# Patient Record
Sex: Female | Born: 1960 | Race: White | Hispanic: No | Marital: Married | Smoking: Current every day smoker
Health system: Southern US, Community
[De-identification: ages and names within clinical notes are randomized; demographics above are authoritative.]

## PROBLEM LIST (undated history)

## (undated) DIAGNOSIS — Z8709 Personal history of other diseases of the respiratory system: Secondary | ICD-10-CM

## (undated) DIAGNOSIS — G4700 Insomnia, unspecified: Secondary | ICD-10-CM

## (undated) DIAGNOSIS — E538 Deficiency of other specified B group vitamins: Secondary | ICD-10-CM

## (undated) DIAGNOSIS — K5900 Constipation, unspecified: Secondary | ICD-10-CM

## (undated) DIAGNOSIS — Z860100 Personal history of colon polyps, unspecified: Secondary | ICD-10-CM

## (undated) DIAGNOSIS — K279 Peptic ulcer, site unspecified, unspecified as acute or chronic, without hemorrhage or perforation: Secondary | ICD-10-CM

## (undated) DIAGNOSIS — Z8742 Personal history of other diseases of the female genital tract: Secondary | ICD-10-CM

## (undated) DIAGNOSIS — E785 Hyperlipidemia, unspecified: Secondary | ICD-10-CM

## (undated) DIAGNOSIS — Z973 Presence of spectacles and contact lenses: Secondary | ICD-10-CM

## (undated) DIAGNOSIS — I739 Peripheral vascular disease, unspecified: Secondary | ICD-10-CM

## (undated) DIAGNOSIS — F32A Depression, unspecified: Secondary | ICD-10-CM

## (undated) DIAGNOSIS — Z8669 Personal history of other diseases of the nervous system and sense organs: Secondary | ICD-10-CM

## (undated) DIAGNOSIS — Z9889 Other specified postprocedural states: Secondary | ICD-10-CM

## (undated) DIAGNOSIS — F419 Anxiety disorder, unspecified: Secondary | ICD-10-CM

## (undated) DIAGNOSIS — R531 Weakness: Secondary | ICD-10-CM

## (undated) DIAGNOSIS — A6000 Herpesviral infection of urogenital system, unspecified: Secondary | ICD-10-CM

## (undated) DIAGNOSIS — K649 Unspecified hemorrhoids: Secondary | ICD-10-CM

## (undated) DIAGNOSIS — K219 Gastro-esophageal reflux disease without esophagitis: Secondary | ICD-10-CM

## (undated) DIAGNOSIS — J45909 Unspecified asthma, uncomplicated: Secondary | ICD-10-CM

## (undated) DIAGNOSIS — R112 Nausea with vomiting, unspecified: Secondary | ICD-10-CM

## (undated) DIAGNOSIS — B009 Herpesviral infection, unspecified: Secondary | ICD-10-CM

## (undated) DIAGNOSIS — N200 Calculus of kidney: Secondary | ICD-10-CM

## (undated) DIAGNOSIS — M549 Dorsalgia, unspecified: Secondary | ICD-10-CM

## (undated) DIAGNOSIS — F329 Major depressive disorder, single episode, unspecified: Secondary | ICD-10-CM

## (undated) DIAGNOSIS — R519 Headache, unspecified: Secondary | ICD-10-CM

## (undated) HISTORY — DX: Herpesviral infection of urogenital system, unspecified: A60.00

## (undated) HISTORY — DX: Peripheral vascular disease, unspecified: I73.9

## (undated) HISTORY — DX: Anxiety disorder, unspecified: F41.9

## (undated) HISTORY — PX: ANKLE SURGERY: SHX546

## (undated) HISTORY — DX: Hyperlipidemia, unspecified: E78.5

## (undated) HISTORY — DX: Personal history of other diseases of the female genital tract: Z87.42

## (undated) HISTORY — PX: OTHER SURGICAL HISTORY: SHX169

## (undated) HISTORY — PX: CORONARY ANGIOPLASTY: SHX604

## (undated) HISTORY — DX: Calculus of kidney: N20.0

## (undated) HISTORY — PX: CERVICAL CONE BIOPSY: SUR198

## (undated) HISTORY — DX: Gastro-esophageal reflux disease without esophagitis: K21.9

## (undated) HISTORY — DX: Herpesviral infection, unspecified: B00.9

## (undated) HISTORY — DX: Deficiency of other specified B group vitamins: E53.8

## (undated) HISTORY — DX: Peptic ulcer, site unspecified, unspecified as acute or chronic, without hemorrhage or perforation: K27.9

## (undated) HISTORY — PX: ECTOPIC PREGNANCY SURGERY: SHX613

## (undated) HISTORY — DX: Unspecified asthma, uncomplicated: J45.909

---

## 1984-05-16 DIAGNOSIS — S82409A Unspecified fracture of shaft of unspecified fibula, initial encounter for closed fracture: Secondary | ICD-10-CM

## 1984-05-16 HISTORY — DX: Unspecified fracture of shaft of unspecified tibia, initial encounter for closed fracture: S82.409A

## 1984-05-16 HISTORY — PX: ANKLE SURGERY: SHX546

## 1998-04-06 ENCOUNTER — Ambulatory Visit (HOSPITAL_COMMUNITY): Admission: RE | Admit: 1998-04-06 | Discharge: 1998-04-06 | Payer: Self-pay | Admitting: Family Medicine

## 1999-07-20 ENCOUNTER — Encounter: Payer: Self-pay | Admitting: Family Medicine

## 1999-07-20 ENCOUNTER — Ambulatory Visit (HOSPITAL_COMMUNITY): Admission: RE | Admit: 1999-07-20 | Discharge: 1999-07-20 | Payer: Self-pay | Admitting: Family Medicine

## 1999-08-20 ENCOUNTER — Other Ambulatory Visit: Admission: RE | Admit: 1999-08-20 | Discharge: 1999-08-20 | Payer: Self-pay | Admitting: Obstetrics and Gynecology

## 2000-03-06 ENCOUNTER — Encounter: Payer: Self-pay | Admitting: Obstetrics and Gynecology

## 2000-03-06 ENCOUNTER — Observation Stay (HOSPITAL_COMMUNITY): Admission: EM | Admit: 2000-03-06 | Discharge: 2000-03-07 | Payer: Self-pay | Admitting: Internal Medicine

## 2000-03-06 ENCOUNTER — Encounter (INDEPENDENT_AMBULATORY_CARE_PROVIDER_SITE_OTHER): Payer: Self-pay

## 2000-12-27 ENCOUNTER — Encounter: Payer: Self-pay | Admitting: Family Medicine

## 2000-12-27 ENCOUNTER — Encounter: Admission: RE | Admit: 2000-12-27 | Discharge: 2000-12-27 | Payer: Self-pay | Admitting: Family Medicine

## 2001-01-08 ENCOUNTER — Encounter: Admission: RE | Admit: 2001-01-08 | Discharge: 2001-01-08 | Payer: Self-pay | Admitting: Family Medicine

## 2001-01-08 ENCOUNTER — Encounter: Payer: Self-pay | Admitting: Family Medicine

## 2001-11-30 ENCOUNTER — Encounter: Payer: Self-pay | Admitting: Family Medicine

## 2001-11-30 ENCOUNTER — Encounter: Admission: RE | Admit: 2001-11-30 | Discharge: 2001-11-30 | Payer: Self-pay | Admitting: Family Medicine

## 2002-01-16 ENCOUNTER — Encounter: Admission: RE | Admit: 2002-01-16 | Discharge: 2002-01-16 | Payer: Self-pay | Admitting: Family Medicine

## 2002-01-16 ENCOUNTER — Encounter: Payer: Self-pay | Admitting: Family Medicine

## 2002-05-14 ENCOUNTER — Encounter: Admission: RE | Admit: 2002-05-14 | Discharge: 2002-05-14 | Payer: Self-pay | Admitting: Family Medicine

## 2002-05-14 ENCOUNTER — Encounter: Payer: Self-pay | Admitting: Family Medicine

## 2002-08-19 ENCOUNTER — Encounter: Payer: Self-pay | Admitting: Family Medicine

## 2002-08-19 ENCOUNTER — Encounter: Admission: RE | Admit: 2002-08-19 | Discharge: 2002-08-19 | Payer: Self-pay | Admitting: Family Medicine

## 2002-12-20 ENCOUNTER — Encounter: Payer: Self-pay | Admitting: Family Medicine

## 2002-12-20 ENCOUNTER — Encounter: Admission: RE | Admit: 2002-12-20 | Discharge: 2002-12-20 | Payer: Self-pay | Admitting: Family Medicine

## 2003-02-07 ENCOUNTER — Encounter: Payer: Self-pay | Admitting: Family Medicine

## 2003-02-07 ENCOUNTER — Encounter: Admission: RE | Admit: 2003-02-07 | Discharge: 2003-02-07 | Payer: Self-pay | Admitting: Family Medicine

## 2003-07-28 ENCOUNTER — Encounter: Admission: RE | Admit: 2003-07-28 | Discharge: 2003-07-28 | Payer: Self-pay | Admitting: Family Medicine

## 2003-07-30 ENCOUNTER — Emergency Department (HOSPITAL_COMMUNITY): Admission: EM | Admit: 2003-07-30 | Discharge: 2003-07-30 | Payer: Self-pay | Admitting: Emergency Medicine

## 2004-11-30 ENCOUNTER — Encounter: Admission: RE | Admit: 2004-11-30 | Discharge: 2004-11-30 | Payer: Self-pay | Admitting: Family Medicine

## 2005-06-16 ENCOUNTER — Encounter: Admission: RE | Admit: 2005-06-16 | Discharge: 2005-06-16 | Payer: Self-pay | Admitting: Family Medicine

## 2005-07-07 ENCOUNTER — Encounter: Admission: RE | Admit: 2005-07-07 | Discharge: 2005-07-07 | Payer: Self-pay | Admitting: Family Medicine

## 2006-05-16 HISTORY — PX: CERVICAL CONE BIOPSY: SUR198

## 2006-11-21 ENCOUNTER — Encounter: Admission: RE | Admit: 2006-11-21 | Discharge: 2006-11-21 | Payer: Self-pay | Admitting: Family Medicine

## 2006-11-27 ENCOUNTER — Encounter: Admission: RE | Admit: 2006-11-27 | Discharge: 2006-11-27 | Payer: Self-pay | Admitting: Family Medicine

## 2006-12-19 ENCOUNTER — Encounter (INDEPENDENT_AMBULATORY_CARE_PROVIDER_SITE_OTHER): Payer: Self-pay | Admitting: Obstetrics and Gynecology

## 2006-12-19 ENCOUNTER — Ambulatory Visit (HOSPITAL_BASED_OUTPATIENT_CLINIC_OR_DEPARTMENT_OTHER): Admission: RE | Admit: 2006-12-19 | Discharge: 2006-12-19 | Payer: Self-pay | Admitting: Obstetrics and Gynecology

## 2007-06-07 ENCOUNTER — Encounter: Admission: RE | Admit: 2007-06-07 | Discharge: 2007-06-07 | Payer: Self-pay | Admitting: Family Medicine

## 2007-09-19 ENCOUNTER — Encounter: Admission: RE | Admit: 2007-09-19 | Discharge: 2007-09-19 | Payer: Self-pay | Admitting: Family Medicine

## 2008-12-05 ENCOUNTER — Encounter: Admission: RE | Admit: 2008-12-05 | Discharge: 2008-12-05 | Payer: Self-pay | Admitting: Family Medicine

## 2009-07-29 ENCOUNTER — Encounter: Admission: RE | Admit: 2009-07-29 | Discharge: 2009-07-29 | Payer: Self-pay | Admitting: Internal Medicine

## 2009-12-07 ENCOUNTER — Encounter: Admission: RE | Admit: 2009-12-07 | Discharge: 2009-12-07 | Payer: Self-pay | Admitting: Internal Medicine

## 2010-06-06 ENCOUNTER — Encounter: Payer: Self-pay | Admitting: Family Medicine

## 2010-09-28 NOTE — Op Note (Signed)
[name], [name]                ACCOUNT NO.:  [id number]   MEDICAL RECORD NO.:  [id number]          PATIENT TYPE:  AMB   LOCATION:  [facility]                         FACILITY:  [facility]   PHYSICIAN:  Malachi Pro. Ambrose Mantle, M.D. DATE OF BIRTH:  07-14-1960   DATE OF PROCEDURE:  12/19/2006  DATE OF DISCHARGE:                               OPERATIVE REPORT   PREOPERATIVE DIAGNOSIS:  Cervical intraepithelial neoplasia III.   POSTOPERATIVE DIAGNOSIS:  Cervical intraepithelial neoplasia III.   OPERATION:  1. Dilatation and curettage.  2. Laser conization with CO2 laser.   OPERATOR:  Malachi Pro. Ambrose Mantle, M.D.   General anesthesia.   The patient was brought to the operating room and placed under  satisfactory general anesthesia.  She was placed in lithotomy position.  The cervix was exposed with an ebonized speculum after the area was  draped with wet towels.  The acetic acid was placed on the cervix and I  looked with the colposcope, identified the extent of the lesion.  The  lesion was not as prominent as it had been in the office when I did the  cervical biopsies.  I outlined a cone, trying to include good space  between the abnormal areas and the conization limit.  The lesion was  mainly on the anterior cervix and almost like a pseudopolyp anteriorly.  I outlined the cone with the CO2 laser at 10 continuous.  I then  injected the laser holes with a dilute solution of Pitressin.  I used  about 10 mL of a solution mixed with 10 units of Pitressin and 120 mL of  saline.  Then with the super pulse on 13 watts, I did the conization.  I  cut off the cone at its endocervical margin with scissors.  I cut the  cone at 12 o'clock.  I then did an endocervical curettage, producing  minimal tissue, then dilated the cervix sufficiently to do an  endometrial curettage.  Minimal tissue found.  The cervix was then  reapproximated with sutures in all four quadrants and an additional  suture was placed at  approximately 6 o'clock where the tissue had sort  of folded together.  The patient seemed to tolerate the procedure well.  I sounded the uterus at the end of the procedure to ensure that the  endocervical canal was patent, and the patient was returned to recovery  in satisfactory condition.  Blood loss was less than 10 mL.      Malachi Pro. Ambrose Mantle, M.D.  Electronically Signed     TFH/MEDQ  D:  12/19/2006  T:  12/19/2006  Job:  [id number]

## 2010-10-01 NOTE — Discharge Summary (Signed)
[facility]  Patient:    [name], [name]                           MRN: [id number] Adm. Date:  03/06/00 Disc. Date: 03/07/00 Attending:  Malachi Pro. Ambrose Mantle, M.D.                           Discharge Summary  HISTORY OF PRESENT ILLNESS:  This is a 50 year old white female with blood type A positive, admitted with presumed left tubal pregnancy.  HOSPITAL COURSE:  She underwent a diagnostic laparoscopy and laparoscopic left salpingectomy for a tubal pregnancy.  The patients quantitative beta HCG was 2699.  Ultrasound showed no intrauterine gestation and something suggestive of a tubal pregnancy lateral to the left ovary.  Hemoglobin on admission 14.8, hematocrit 41.7, white count 12,700, platelet count 319,000.  Comprehensive metabolic profile was normal.  Follow-up hematocrit was 33.  On the day after surgery, the patient was afebrile.  She had voided after the catheter was removed.  She was tolerating a regular diet.  Her abdomen was soft, slightly tender from the laparoscopy, no evidence of intra-abdominal hemorrhage.  She was considered a candidate for discharge.  DISCHARGE DIAGNOSIS:  Left tubal pregnancy.  OPERATION:  Laparoscopic left salpingectomy.  FINAL CONDITION:  Improved.  INSTRUCTIONS:  Regular diet, no vaginal entrance, no heavy lifting or strenuous activity.  Call with fever above 100 degrees.  Call with any unusual problems.  Return to the office in one week for follow-up examination. Percocet 5/325 16 tablets one every 4 to 6 hours is given at discharge.  Blood type is A positive. DD:  03/07/00 TD:  03/07/00 Job: [id number]

## 2010-10-01 NOTE — Op Note (Signed)
[facility]  Patient:    [name], [name]                         MRN: [id number] Proc. Date: 03/06/00 Adm. Date:  [id number] Disc. Date: [id number] Attending:  Malon Kindle                           Operative Report  PREOPERATIVE DIAGNOSIS:  Left tubal pregnancy.  POSTOPERATIVE DIAGNOSIS:  Left tubal pregnancy.  OPERATIONS: 1. Diagnostic laparoscopy. 2. Laparoscopic removal of left tubal pregnancy.  OPERATOR:  Malachi Pro. Ambrose Mantle, M.D.  ANESTHESIA:  General.  DESCRIPTION OF PROCEDURE:  The patient was brought to the operating room, placed under satisfactory general anesthesia, and placed in Allen stirrups. The abdomen, vulva, vagina, and urethra were prepped with Betadine solution. The bladder was emptied with a [place] catheter.  The uterus was anterior and quite small.  I did not palpate hard for adnexal masses.  The cervix was drawn into the operative field and a Hulka cannula was placed into the uterus and attached to the anterior cervical lip.  The abdomen was then draped as a sterile field.  A small incision was made in the inferior portion of the umbilicus.  A Veress cannula was placed into the abdominal cavity, elevating the abdominal wall.  It revealed a negative pressure.  Saline was pushed into the abdominal cavity and none could be recovered.  About 3 L of CO2 were then injected into the abdominal cavity.  The incision was enlarged.  During the insufflation, the pressure was always 4-8 mmHg.  After the incision was enlarged, a laparoscopic trocar was inserted, followed by the laparoscope with good visualization of the pelvic contents.  A smaller trocar was inserted under direct vision suprapubically.  Exploration of the abdomen revealed the upper abdomen to be clean.  The liver was smooth.  I did not see any upper abdominal abnormalities.  Exploration of the pelvis revealed the uterus to be anterior and normal size.  The anterior  cul-de-sac was clean.  There was a small amount of blood in the posterior cul-de-sac.  The right tube and ovary appeared normal, except for some very slight tubo-ovarian adhesions.  The fimbriated end of the right tube looked normal.  Exploration of the left adnexa revealed that the left tube was normal in its proximal half and then it dilated into a blue-red mass that was the tubal pregnancy.  The ovary appeared normal, although it was somewhat adherent to the tube.  The easiest approach to the mass was proximally, so I cauterized the proximal part of the tube starting about a couple of centimeters from the uterine cornu and then gradually cauterized down the mesosalpinx.  As I would cauterize, periodically I would cut the cauterized mesosalpinx.  As I got closer to the end of the tube, the tube and ovary were as stated earlier, somewhat adherent.  I was able to dissect that apart and then continue the cauterization until I had removed the left tubal mass.  At this point, I wanted to remove the mass through the umbilical port, so I put the camera through the lower port with a 5 mm laparoscope, but I could not get the tubal mass to go up into the umbilical trocar site.  Therefore, I introduced the 10 mm trocar through the lower port under direct vision and then was able to pull the tubal  mass into the lower trocar itself and then I removed the entire trocar and the tubal pregnancy.  I liberally irrigated with the Nezhat irrigator.  There was no bleeding.  There was no sign of injury to any structures other than the mesosalpinx.  The patient was left with a normal-appearing left ovary, a normal-appearing right tube and ovary, except for a few tubo-ovarian adhesions, a normal uterus, and normal cul-de-sacs.  The CO2 was allowed to escape.  I inspected the trocar site intra-abdominally and suprapubically and there was no bleeding there.  I then very slowly removed the laparoscope through the  umbilical incision and there was no bleeding there.  I closed both sites with interrupted sutures of 3-0 plain catgut, trying to place them as deep into the subcutaneous tissues as possible.  The patient seemed to tolerate the procedure well.  Blood loss was less than 10 cc.  The sponge and needle counts were correct.  The Hulka cannula was removed from the uterus. The patient was returned to recovery in satisfactory condition.  During the procedure, the bladder filled and a Foley catheter was inserted. DD:  03/07/00 TD:  03/07/00 Job: [id number]

## 2010-10-01 NOTE — H&P (Signed)
[facility]  Patient:    [name], [name]                         MRN: [id number] Adm. Date:  [id number] Disc. Date: [id number] Attending:  Malon Kindle                         History and Physical  HISTORY OF PRESENT ILLNESS:  This is a 50 year old white female, para 0-1-2-1, gravida 4, with last menstrual period February 21, 2000, lasting for two days and very light.  Previous period January 24, 2000, for six days.  The patient is admitted for presumed left tubal pregnancy.  This patient saw me on February 29, 2000, complaining of a vaginal discharge with odor and she had had some night sweats present for two months.  An FSH was normal, actually low at 0.8.  She gave a history of having a period that was very light on February 21, 2000, for two days and previous period on January 24, 2000, for six days. The period before that was December 24, 1999, for six days.  She stated that her body became like an oven before her periods.  Her examination was completely negative, except for bacterial vaginosis and she was treated with metronidazole.  She was advised to stop smoking.  She again came in on March 06, 2000, complaining of left side pain that started on March 05, 2000, but at 10 a.m. on March 06, 2000, became severe and doubled her over. Her pregnancy test was positive.  She gave a history of noticing some spotting from the vagina, a pink discharge, over the last 24 hours.  An ultrasound showed no evidence of an intrauterine pregnancy, a possible complex mass adjacent to the left ovary that was about 2.3 cm in maximal diameter.  The patients beta hCG was between 2000-3000.  She was admitted for laparoscopy and possible removal of a left tubal pregnancy.  ALLERGIES:  "LIQUID DEMEROL."  PAST SURGICAL HISTORY:  D&C, conization, ankle surgery, tubal pregnancy in 1997 by laparoscopy.  PAST MEDICAL HISTORY:  Usual childhood diseases.  No major adult  illnesses. No heart problems.  SOCIAL HISTORY:  Alcohol:  Occasional.  Tobacco:  One half pack of cigarettes a day.  REVIEW OF SYSTEMS:  Recent pain with urination.  Otherwise as in the present illness.  FAMILY HISTORY:  Mother alive at 40.  Fathers health unknown.  No brothers or sisters.  MEDICATIONS:  The patient is currently taking metronidazole.  PHYSICAL EXAMINATION:  A well-developed, well-nourished, white female in distress from lower abdominal and left back pain.  VITAL SIGNS:  Pulse 90 sitting and blood pressure 110/70 sitting.  Lying down pulse 90 and blood pressure 130/88.  HEENT:  No cranial abnormalities.  Extraocular movements intact.  Nose and pharynx clear.  NECK:  Supple without thyromegaly.  HEART:  Normal size and sounds.  No murmurs.  LUNGS:  Clear to P&A.  ABDOMEN:  Soft.  Slight referred tenderness from palpation in the right lower quadrant to the left lower quadrant.  No mass.  No rebound tenderness.  PELVIC:  The vulva and vagina are clean, except slight blood discharge.  The cervix is quite tender to motion.  The uterus is anterior and small.  Adnexa: Cannot feel any mass.  ADMITTING IMPRESSION:  Probable left tubal pregnancy.  The patient is admitted for diagnostic laparoscopy. DD:  03/07/00 TD:  03/07/00 Job: [id number]

## 2010-11-11 ENCOUNTER — Other Ambulatory Visit: Payer: Self-pay | Admitting: Internal Medicine

## 2010-11-11 DIAGNOSIS — Z1231 Encounter for screening mammogram for malignant neoplasm of breast: Secondary | ICD-10-CM

## 2010-12-15 ENCOUNTER — Ambulatory Visit
Admission: RE | Admit: 2010-12-15 | Discharge: 2010-12-15 | Disposition: A | Discharge status: Home / Self Care | Payer: Private Health Insurance - Indemnity | Source: Ambulatory Visit | Attending: Internal Medicine | Admitting: Internal Medicine

## 2010-12-15 DIAGNOSIS — Z1231 Encounter for screening mammogram for malignant neoplasm of breast: Secondary | ICD-10-CM

## 2011-02-28 LAB — DIFFERENTIAL
Basophils Absolute: 0.1
Basophils Relative: 1
Eosinophils Absolute: 0.1
Eosinophils Relative: 1
Lymphocytes Relative: 21
Lymphs Abs: 1.7
Monocytes Absolute: 0.5
Monocytes Relative: 6
Neutro Abs: 5.4
Neutrophils Relative %: 71

## 2011-02-28 LAB — COMPREHENSIVE METABOLIC PANEL
ALT: 18
AST: 18
Albumin: 3.8
Alkaline Phosphatase: 65
BUN: 10
CO2: 27
Calcium: 9.1
Chloride: 98
Creatinine, Ser: 0.82
GFR calc Af Amer: >60
GFR calc non Af Amer: >60
Glucose, Bld: 81
Potassium: 4.2
Sodium: 134 — ABNORMAL LOW
Total Bilirubin: 0.5
Total Protein: 6.5

## 2011-02-28 LAB — CBC
HCT: 43.6
Hemoglobin: 14.2
MCHC: 32.7
MCV: 92.5
Platelets: 326
RBC: 4.71
RDW: 12.4
WBC: 7.8

## 2011-02-28 LAB — PREGNANCY, URINE: Preg Test, Ur: NEGATIVE

## 2012-04-18 ENCOUNTER — Other Ambulatory Visit: Payer: Self-pay | Admitting: Internal Medicine

## 2012-04-18 DIAGNOSIS — Z1231 Encounter for screening mammogram for malignant neoplasm of breast: Secondary | ICD-10-CM

## 2012-05-29 ENCOUNTER — Ambulatory Visit
Admission: RE | Admit: 2012-05-29 | Discharge: 2012-05-29 | Disposition: A | Discharge status: Home / Self Care | Payer: Private Health Insurance - Indemnity | Source: Ambulatory Visit | Attending: Internal Medicine | Admitting: Internal Medicine

## 2012-05-29 DIAGNOSIS — Z1231 Encounter for screening mammogram for malignant neoplasm of breast: Secondary | ICD-10-CM

## 2012-12-20 ENCOUNTER — Encounter: Payer: Self-pay | Admitting: Vascular Surgery

## 2012-12-20 ENCOUNTER — Other Ambulatory Visit: Payer: Self-pay

## 2012-12-20 DIAGNOSIS — I739 Peripheral vascular disease, unspecified: Secondary | ICD-10-CM

## 2013-01-11 ENCOUNTER — Encounter: Payer: Self-pay | Admitting: Vascular Surgery

## 2013-01-15 ENCOUNTER — Encounter: Payer: Self-pay | Admitting: Vascular Surgery

## 2013-01-15 ENCOUNTER — Encounter: Payer: Private Health Insurance - Indemnity | Admitting: Vascular Surgery

## 2013-01-15 ENCOUNTER — Encounter (INDEPENDENT_AMBULATORY_CARE_PROVIDER_SITE_OTHER): Payer: Private Health Insurance - Indemnity | Admitting: *Deleted

## 2013-01-15 ENCOUNTER — Ambulatory Visit (INDEPENDENT_AMBULATORY_CARE_PROVIDER_SITE_OTHER): Payer: Private Health Insurance - Indemnity | Admitting: Vascular Surgery

## 2013-01-15 ENCOUNTER — Other Ambulatory Visit (INDEPENDENT_AMBULATORY_CARE_PROVIDER_SITE_OTHER): Payer: Private Health Insurance - Indemnity | Admitting: *Deleted

## 2013-01-15 ENCOUNTER — Other Ambulatory Visit: Payer: Self-pay | Admitting: *Deleted

## 2013-01-15 VITALS — BP 129/89 | HR 80 | Ht 67.0 in | Wt 161.2 lb

## 2013-01-15 DIAGNOSIS — I739 Peripheral vascular disease, unspecified: Secondary | ICD-10-CM

## 2013-01-15 NOTE — Progress Notes (Signed)
[facility] [place] HISTORY AND PHYSICAL   CC:  Pain in bilateral thighs (left > right) as well as some cramping in the right calf with walking and toes on the left have some numbness at times.  Thayer Headings, MD  HPI: This is a 52 y.o. female who presents today with complaints of cramping like sensation in her bilateral thigh and buttocks with the left being worse than the right.  She states her symptoms have been ongoing for about 3 years, but have progressively worsened over time.  She states that when she is walking with weight (brief case, etc) her cramping is worse.  She states that without any weight, she does better, but after walking about a block, she has to stop and rest to relieve the pain.  She denies any non healing ulcers/sores.  She denies any symptoms of stroke; denies chest pain/pressure.  She does have a tobacco history of ~ 1ppd for about 35 years.  She states that she does have plates and screws in her left ankle from fib/tib Fx in the past.  Otherwise, she states she has been in good health.   Past Medical History  Diagnosis Date  . Hyperlipidemia   . GERD (gastroesophageal reflux disease)   . Genital herpes   . Vitamin B12 deficiency   . Anxiety   . Peripheral vascular disease    Past Surgical History  Procedure Laterality Date  . Ankle surgery      Allergies  Allergen Reactions  . Chantix [Varenicline]   . Fluvastatin Sodium   . Septra [Sulfamethoxazole-Tmp Ds]   . Simvastatin     Current Outpatient Prescriptions  Medication Sig Dispense Refill  . ALPRAZolam (XANAX) 0.5 MG tablet Take 0.5 mg by mouth at bedtime as needed for sleep.      . Ascorbic Acid (VITAMIN C) 1000 MG tablet Take 1,000 mg by mouth daily.      . Nutritional Supplements (BIFIDO-GENIC GROWTH FACTORS PO) Take 4 tablets by mouth daily.      Marland Kitchen omeprazole (PRILOSEC) 20 MG capsule Take 20 mg by mouth as needed.       . valACYclovir (VALTREX) 500 MG tablet Take 500 mg  by mouth as needed.      . Venlafaxine HCl 75 MG TB24 Take 1 tablet by mouth daily.      . vitamin B-12 (CYANOCOBALAMIN) 1000 MCG tablet Take 1,000 mcg by mouth daily.       No current facility-administered medications for this visit.    Pt meds includes: Statin:  no (allergy to simvistatin) Beta Blocker:  no Aspirin:  no ACEI:  no ARB:  no Other Antiplatelet/Anticoagulant:  no  Family History  Problem Relation Age of Onset  . Hyperlipidemia Mother     History   Social History  . Marital Status: Married    Spouse Name: N/A    Number of Children: N/A  . Years of Education: N/A   Occupational History  . Not on file.   Social History Main Topics  . Smoking status: Current Every Day Smoker -- 0.75 packs/day for 30 years    Types: Cigarettes  . Smokeless tobacco: Never Used  . Alcohol Use: 1.5 - 2 oz/week    3-4 drink(s) per week     Comment: weekends only  . Drug Use: Not on file  . Sexual Activity: Not on file   Other Topics Concern  . Not on file   Social History Narrative  .  No narrative on file     ROS: [x]  Positive   [ ]  Negative   [ ]  All sytems reviewed and are negative  Cardiovascular: []  chest pain/pressure []  palpitations []  SOB lying flat []  DOE []  pain in legs while walking []  pain in feet when lying flat []  hx of DVT []  hx of phlebitis []  swelling in legs []  varicose veins  Pulmonary: []  productive cough []  asthma []  wheezing  Neurologic: []  weakness in []  arms []  legs []  numbness in []  arms []  legs [] difficulty speaking or slurred speech []  temporary loss of vision in one eye []  dizziness  Hematologic: []  bleeding problems []  problems with blood clotting easily  GI []  vomiting blood []  blood in stool  GU: []  burning with urination []  blood in urine  Psychiatric: []  hx of major depression  Integumentary: []  rashes []  ulcers  Constitutional: []  fever []  chills   PHYSICAL EXAMINATION:  Filed Vitals:   01/15/13  1342  BP: 129/89  Pulse: 80   Body mass index is 25.24 kg/(m^2).  General:  WDWN in NAD Gait: Not observed HENT: WNL, normocephalic Eyes: Pupils equal Pulmonary: normal non-labored breathing , without Rales, rhonchi,  wheezing Cardiac: RRR, without  Murmurs, rubs or gallops; without carotid bruits Abdomen: soft, NT, no masses Skin: without rashes, without ulcers  Vascular Exam/Pulses:bilateral femoral, popliteal and pedal pulses are absent. Extremities: without ischemic changes, without Gangrene , without cellulitis; without open wounds;  Musculoskeletal: no muscle wasting or atrophy  Neurologic: A&O X 3; Appropriate Affect ; SENSATION: normal; MOTOR FUNCTION:  moving all extremities equally. Speech is fluent/normal   Non-Invasive Vascular Imaging:  01/15/13 Bilateral Common Iliac artery occlusions Occlusion at the popliteal artery on the right  Outside facility ABI's as follows: Right 0.57 Left 0.52   ASSESSMENT: 52 y.o. 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  PLAN: -Given the severity of her iliac occlusions, we will schedule her for a nuclear stress test either later this week or next week.  -We will schedule her for an arteriogram with possible intervention by Dr. Myra Gianotti on 01/29/13.  This will most likely be unsuccessful given the nature of the occlusion.  If so, she will need a CTA of the abdomen and pelvis with runoff to further evaluate her anatomy.  She will most likely need an aortobifemoral bypass graft if Dr. Myra Gianotti is not able to balloon and stent the bilateral common iliac arteries. -she will f/u with Dr. Hart Rochester on 02/05/13 to discuss the results of the nuclear stress test as well as the arteriogram. -the pt is counseled on the importance of tobacco cessation as it can cause CVA/MI,PVD, etc and long term outcomes.  She states that she will stop smoking today.   Doreatha Massed,  PA-C [facility] [phone]  Clinic MD:  Pt seen and examined in conjunction with Dr. 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 Myra Gianotti is unsuccessful in crossing these lesions and performing bilateral iliac PTA and stents hopefully can proceed with CT angiogram of abdomen and pelvis of same day while patient is at hospital and then likely will need aortobifemoral bypass graft Ablation would be a good candidate for this particularly at her  young age with no known history of coronary artery disease

## 2013-01-16 ENCOUNTER — Ambulatory Visit (INDEPENDENT_AMBULATORY_CARE_PROVIDER_SITE_OTHER): Payer: Private Health Insurance - Indemnity | Admitting: Cardiology

## 2013-01-16 ENCOUNTER — Encounter: Payer: Self-pay | Admitting: Cardiology

## 2013-01-16 VITALS — BP 138/88 | HR 93 | Ht 67.0 in | Wt 162.0 lb

## 2013-01-16 DIAGNOSIS — R0600 Dyspnea, unspecified: Secondary | ICD-10-CM

## 2013-01-16 DIAGNOSIS — Z01810 Encounter for preprocedural cardiovascular examination: Secondary | ICD-10-CM

## 2013-01-16 DIAGNOSIS — R0609 Other forms of dyspnea: Secondary | ICD-10-CM

## 2013-01-16 NOTE — Patient Instructions (Addendum)
The current medical regimen is effective;  continue present plan and medications.  Your physician has requested that you have a lexiscan myoview. For further information please visit [email or web address]. Please follow instruction sheet, as given.  Follow up as needed. 

## 2013-01-16 NOTE — Progress Notes (Signed)
HPI The patient presents preoperatively prior to possible lower extremity surgery. She's been found to have probable bilateral common iliac occlusion. She's been having some leg pain in her right leg and left hip for some period of time. She is now being set up for an angiogram and might have percutaneous revascularization versus surgical revascularization. She has not had a cardiac history and denies any history of stress testing or catheterization in the past. She does have significant risk factors such as dyslipidemia and previous tobacco. She does not know her father's side of the family and so doesn't know his family history. The patient denies any new symptoms such as chest discomfort, neck or arm discomfort. There has been no PND or orthopnea. There have been no reported palpitations, presyncope or syncope.  She does however have some dyspnea walking up a flight of stairs.  Allergies  Allergen Reactions  . Chantix [Varenicline]   . Fluvastatin Sodium   . Septra [Sulfamethoxazole-Tmp Ds]   . Simvastatin     Current Outpatient Prescriptions  Medication Sig Dispense Refill  . ALPRAZolam (XANAX) 0.5 MG tablet Take 0.5 mg by mouth at bedtime as needed for sleep.      . Ascorbic Acid (VITAMIN C) 1000 MG tablet Take 1,000 mg by mouth daily.      . Nutritional Supplements (BIFIDO-GENIC GROWTH FACTORS PO) Take 4 tablets by mouth daily.      Marland Kitchen omeprazole (PRILOSEC) 20 MG capsule Take 20 mg by mouth as needed.       . valACYclovir (VALTREX) 500 MG tablet Take 500 mg by mouth as needed.      . Venlafaxine HCl 75 MG TB24 Take 1 tablet by mouth daily.      . vitamin B-12 (CYANOCOBALAMIN) 1000 MCG tablet Take 1,000 mcg by mouth daily.       No current facility-administered medications for this visit.    Past Medical History  Diagnosis Date  . Hyperlipidemia   . GERD (gastroesophageal reflux disease)   . Genital herpes   . Vitamin B12 deficiency   . Anxiety   . Peripheral vascular disease       Past Surgical History  Procedure Laterality Date  . Ankle surgery      Family History  Problem Relation Age of Onset  . Hyperlipidemia Mother     History   Social History  . Marital Status: Married    Spouse Name: N/A    Number of Children: N/A  . Years of Education: N/A   Occupational History  . Not on file.   Social History Main Topics  . Smoking status: Current Every Day Smoker -- 0.75 packs/day for 30 years    Types: Cigarettes  . Smokeless tobacco: Never Used  . Alcohol Use: 1.5 - 2 oz/week    3-4 drink(s) per week     Comment: weekends only  . Drug Use: Not on file  . Sexual Activity: Not on file   Other Topics Concern  . Not on file   Social History Narrative  . No narrative on file    ROS:  Positive for reflux, rhinitis. Otherwise as stated in the HPI and negative for all other systems.  PHYSICAL EXAM BP 138/88  Pulse 93  Ht 5\' 7"  (1.702 m)  Wt 162 lb (73.483 kg)  BMI 25.37 kg/m2 GENERAL:  Well appearing HEENT:  Pupils equal round and reactive, fundi not visualized, oral mucosa unremarkable NECK:  No jugular venous distention, waveform within normal limits,  carotid upstroke brisk and symmetric, no bruits, no thyromegaly LYMPHATICS:  No cervical, inguinal adenopathy LUNGS:  Clear to auscultation bilaterally BACK:  No CVA tenderness CHEST:  Unremarkable HEART:  PMI not displaced or sustained,S1 and S2 within normal limits, no S3, no S4, no clicks, no rubs, no murmurs ABD:  Flat, positive bowel sounds normal in frequency in pitch, no bruits, no rebound, no guarding, no midline pulsatile mass, no hepatomegaly, no splenomegaly EXT:  2 plus pulses upper pulses with diminished bilateral femorals, popliteals, dorsalis pedis and posterior tibialis., no edema, no cyanosis no clubbing SKIN:  No rashes no nodules NEURO:  Cranial nerves II through XII grossly intact, motor grossly intact throughout PSYCH:  Cognitively intact, oriented to person place and  time   EKG:  Sinus rhythm, rate 93, axis within normal limits, intervals within normal limits, no acute ST-T wave changes.  ASSESSMENT AND PLAN  PREOP:  The patient is going for a procedure that could be high risk from a cardiovascular standpoint with vascular surgery. She has a somewhat low functional level because of leg pain. Therefore, according to ACC/AHA guidelines stress testing is indicated. However, she would not be a little walk on a treadmill. Therefore, she will have a [employer]. Further clearance will be based on these results.  TOBACCO:  She had a bad reaction to Chantix previously. She thinks that she can quit smoking by just putting them down.  DYSLIPIDEMIA:  She does have an indication for a statin but was intolerant of 1 in the past. I have asked her to discuss this again with Thayer Headings, MD As she would need to be on one of these but it might be reasonable to wait until she recovers from her upcoming procedure.

## 2013-01-17 ENCOUNTER — Ambulatory Visit: Payer: Private Health Insurance - Indemnity | Admitting: Cardiology

## 2013-01-17 ENCOUNTER — Encounter: Payer: Self-pay | Admitting: Internal Medicine

## 2013-01-18 ENCOUNTER — Other Ambulatory Visit: Payer: Self-pay

## 2013-01-21 ENCOUNTER — Other Ambulatory Visit: Payer: Self-pay | Admitting: *Deleted

## 2013-01-21 ENCOUNTER — Ambulatory Visit (HOSPITAL_COMMUNITY): Payer: Private Health Insurance - Indemnity | Attending: Cardiology | Admitting: Radiology

## 2013-01-21 VITALS — BP 115/77 | Ht 67.0 in | Wt 161.0 lb

## 2013-01-21 DIAGNOSIS — R002 Palpitations: Secondary | ICD-10-CM | POA: Insufficient documentation

## 2013-01-21 DIAGNOSIS — I739 Peripheral vascular disease, unspecified: Secondary | ICD-10-CM

## 2013-01-21 DIAGNOSIS — I745 Embolism and thrombosis of iliac artery: Secondary | ICD-10-CM | POA: Insufficient documentation

## 2013-01-21 DIAGNOSIS — Z01810 Encounter for preprocedural cardiovascular examination: Secondary | ICD-10-CM

## 2013-01-21 DIAGNOSIS — I70219 Atherosclerosis of native arteries of extremities with intermittent claudication, unspecified extremity: Secondary | ICD-10-CM

## 2013-01-21 DIAGNOSIS — R0989 Other specified symptoms and signs involving the circulatory and respiratory systems: Secondary | ICD-10-CM | POA: Insufficient documentation

## 2013-01-21 DIAGNOSIS — F17200 Nicotine dependence, unspecified, uncomplicated: Secondary | ICD-10-CM | POA: Insufficient documentation

## 2013-01-21 DIAGNOSIS — R0600 Dyspnea, unspecified: Secondary | ICD-10-CM

## 2013-01-21 DIAGNOSIS — R0609 Other forms of dyspnea: Secondary | ICD-10-CM | POA: Insufficient documentation

## 2013-01-21 DIAGNOSIS — E785 Hyperlipidemia, unspecified: Secondary | ICD-10-CM | POA: Insufficient documentation

## 2013-01-21 HISTORY — PX: CARDIOVASCULAR STRESS TEST: SHX262

## 2013-01-21 MED ORDER — TECHNETIUM TC 99M SESTAMIBI GENERIC - CARDIOLITE
33.0000 | Freq: Once | INTRAVENOUS | Status: AC | PRN
  Administered 2013-01-21: 33 via INTRAVENOUS

## 2013-01-21 MED ORDER — REGADENOSON 0.4 MG/5ML IV SOLN
0.4000 mg | Freq: Once | INTRAVENOUS | Status: AC
  Administered 2013-01-21: 0.4 mg via INTRAVENOUS

## 2013-01-21 MED ORDER — TECHNETIUM TC 99M SESTAMIBI GENERIC - CARDIOLITE
11.0000 | Freq: Once | INTRAVENOUS | Status: AC | PRN
  Administered 2013-01-21: 11 via INTRAVENOUS

## 2013-01-21 NOTE — Progress Notes (Signed)
[facility] SITE 3 NUCLEAR MED [address] [place], [place] [address] [phone]    Cardiology Nuclear Med Study  [name] is a 52 y.o. female     MRN : [id number]     DOB: Sep 28, 1960  Procedure Date: 01/21/2013  Nuclear Med Background Indication for Stress Test:  Evaluation for Ischemia, and Surgical Clearance for  Abdominal Aortagram due to bilateral iliac occlusion on 01-29-13 by Coral Else, MD History:  No Cardiac HX Cardiac Risk Factors: Lipids, PVD and Smoker  Symptoms:  DOE and Palpitations   Nuclear Pre-Procedure Caffeine/Decaff Intake:  None >12 hrs NPO After: 6:30pm   Lungs:  clear O2 Sat: 99% on room air. IV 0.9% NS with Angio Cath:  22g  IV Site: R Antecubital x 1, tolerated well IV Started by:  Irean Hong, RN  Chest Size (in):  36 Cup Size: DD  Height: 5\' 7"  (1.702 m)  Weight:  161 lb (73.029 kg)  BMI:  Body mass index is 25.21 kg/(m^2). Tech Comments:  n/a    Nuclear Med Study 1 or 2 day study: 1 day  Stress Test Type:  Lexiscan  Reading MD: Olga Millers, MD  Order Authorizing Provider:  Rollene Rotunda, MD  Resting Radionuclide: Technetium 37m Sestamibi  Resting Radionuclide Dose: 11.0 mCi   Stress Radionuclide:  Technetium 58m Sestamibi  Stress Radionuclide Dose: 33.0 mCi           Stress Protocol Rest HR: 68 Stress HR: 110  Rest BP: 115/77 Stress BP: 139/83  Exercise Time (min): n/a METS: n/a   Predicted Max HR: 169 bpm % Max HR: 65.09 bpm Rate Pressure Product: 41324   Dose of Adenosine (mg):  n/a Dose of Lexiscan: 0.4 mg  Dose of Atropine (mg): n/a Dose of Dobutamine: n/a mcg/kg/min (at max HR)  Stress Test Technologist: Milana Na, EMT-P  Nuclear Technologist:  Harlow Asa, CNMT     Rest Procedure:  Myocardial perfusion imaging was performed at rest 45 minutes following the intravenous administration of Technetium 53m Sestamibi. Rest ECG: NSR, RAD.  Stress Procedure:  The patient received IV Lexiscan 0.4 mg  over 15-seconds.  Technetium 68m Sestamibi injected at 30-seconds. This patient had chest tightness and was anxious with the Lexiscan injection. Quantitative spect images were obtained after a 45 minute delay. Stress ECG: No significant ST segment change suggestive of ischemia.  QPS Raw Data Images:  Acquisition technically good; normal left ventricular size. Stress Images:  There is decreased uptake in the apex. Rest Images:  There is decreased uptake in the apex. Subtraction (SDS):  No evidence of ischemia. Transient Ischemic Dilatation (Normal <1.22):  n/a Lung/Heart Ratio (Normal <0.45):  0.34  Quantitative Gated Spect Images QGS EDV:  62 ml QGS ESV:  17 ml  Impression Exercise Capacity:  Lexiscan with no exercise. BP Response:  Normal blood pressure response. Clinical Symptoms:  There is chest tightness. ECG Impression:  No significant ST segment change suggestive of ischemia. Comparison with Prior Nuclear Study: No images to compare  Overall Impression:  Low risk stress nuclear study with a small, fixed, medium intensity apical defect consistent with thinning; no ischemia.  LV Ejection Fraction: 72%.  LV Wall Motion:  NL LV Function; NL Wall Motion  Olga Millers

## 2013-01-23 ENCOUNTER — Encounter: Payer: Private Health Insurance - Indemnity | Admitting: Vascular Surgery

## 2013-01-24 ENCOUNTER — Ambulatory Visit
Admission: RE | Admit: 2013-01-24 | Discharge: 2013-01-24 | Disposition: A | Discharge status: Home / Self Care | Payer: Private Health Insurance - Indemnity | Source: Ambulatory Visit | Attending: Surgery | Admitting: Surgery

## 2013-01-24 DIAGNOSIS — Z01810 Encounter for preprocedural cardiovascular examination: Secondary | ICD-10-CM

## 2013-01-24 DIAGNOSIS — I70219 Atherosclerosis of native arteries of extremities with intermittent claudication, unspecified extremity: Secondary | ICD-10-CM

## 2013-01-24 DIAGNOSIS — I739 Peripheral vascular disease, unspecified: Secondary | ICD-10-CM

## 2013-01-24 MED ORDER — IOHEXOL 350 MG/ML SOLN
100.0000 mL | Freq: Once | INTRAVENOUS | Status: AC | PRN
  Administered 2013-01-24: 100 mL via INTRAVENOUS

## 2013-01-25 ENCOUNTER — Encounter (HOSPITAL_COMMUNITY): Payer: Self-pay

## 2013-01-25 ENCOUNTER — Ambulatory Visit (HOSPITAL_COMMUNITY)
Admission: RE | Admit: 2013-01-25 | Discharge: 2013-01-25 | Disposition: A | Discharge status: Home / Self Care | Payer: Private Health Insurance - Indemnity | Source: Ambulatory Visit | Attending: Anesthesiology | Admitting: Anesthesiology

## 2013-01-25 ENCOUNTER — Encounter (HOSPITAL_COMMUNITY)
Admission: RE | Admit: 2013-01-25 | Discharge: 2013-01-25 | Disposition: A | Discharge status: Home / Self Care | Payer: Private Health Insurance - Indemnity | Source: Ambulatory Visit | Attending: Vascular Surgery | Admitting: Vascular Surgery

## 2013-01-25 ENCOUNTER — Other Ambulatory Visit: Payer: Self-pay

## 2013-01-25 DIAGNOSIS — Z01818 Encounter for other preprocedural examination: Secondary | ICD-10-CM | POA: Insufficient documentation

## 2013-01-25 DIAGNOSIS — F17200 Nicotine dependence, unspecified, uncomplicated: Secondary | ICD-10-CM | POA: Insufficient documentation

## 2013-01-25 DIAGNOSIS — I739 Peripheral vascular disease, unspecified: Secondary | ICD-10-CM | POA: Insufficient documentation

## 2013-01-25 DIAGNOSIS — Z01812 Encounter for preprocedural laboratory examination: Secondary | ICD-10-CM | POA: Insufficient documentation

## 2013-01-25 DIAGNOSIS — E785 Hyperlipidemia, unspecified: Secondary | ICD-10-CM | POA: Insufficient documentation

## 2013-01-25 HISTORY — DX: Major depressive disorder, single episode, unspecified: F32.9

## 2013-01-25 HISTORY — DX: Personal history of colon polyps, unspecified: Z86.0100

## 2013-01-25 HISTORY — DX: Weakness: R53.1

## 2013-01-25 HISTORY — DX: Dorsalgia, unspecified: M54.9

## 2013-01-25 HISTORY — DX: Depression, unspecified: F32.A

## 2013-01-25 HISTORY — DX: Personal history of other diseases of the nervous system and sense organs: Z86.69

## 2013-01-25 HISTORY — DX: Insomnia, unspecified: G47.00

## 2013-01-25 HISTORY — DX: Constipation, unspecified: K59.00

## 2013-01-25 HISTORY — DX: Personal history of other diseases of the respiratory system: Z87.09

## 2013-01-25 HISTORY — DX: Personal history of colonic polyps: Z86.010

## 2013-01-25 HISTORY — DX: Unspecified hemorrhoids: K64.9

## 2013-01-25 LAB — COMPREHENSIVE METABOLIC PANEL
ALT: 25 U/L (ref 0–35)
AST: 19 U/L (ref 0–37)
Albumin: 3.6 g/dL (ref 3.5–5.2)
Alkaline Phosphatase: 110 U/L (ref 39–117)
BUN: 10 mg/dL (ref 6–23)
CO2: 19 mEq/L (ref 19–32)
Calcium: 9.7 mg/dL (ref 8.4–10.5)
Chloride: 100 mEq/L (ref 96–112)
Creatinine, Ser: 0.62 mg/dL (ref 0.50–1.10)
GFR calc Af Amer: >90 mL/min (ref >90)
GFR calc non Af Amer: >90 mL/min (ref >90)
Glucose, Bld: 91 mg/dL (ref 70–99)
Potassium: 4 mEq/L (ref 3.5–5.1)
Sodium: 135 mEq/L (ref 135–145)
Total Bilirubin: 0.2 mg/dL — ABNORMAL LOW (ref 0.3–1.2)
Total Protein: 7.3 g/dL (ref 6.0–8.3)

## 2013-01-25 LAB — CBC
HCT: 42.2 % (ref 36.0–46.0)
Hemoglobin: 15 g/dL (ref 12.0–15.0)
MCH: 32.3 pg (ref 26.0–34.0)
MCHC: 35.5 g/dL (ref 30.0–36.0)
MCV: 90.9 fL (ref 78.0–100.0)
Platelets: 288 10*3/uL (ref 150–400)
RBC: 4.64 MIL/uL (ref 3.87–5.11)
RDW: 12.8 % (ref 11.5–15.5)
WBC: 10.5 10*3/uL (ref 4.0–10.5)

## 2013-01-25 LAB — URINALYSIS, ROUTINE W REFLEX MICROSCOPIC
Bilirubin Urine: NEGATIVE
Glucose, UA: NEGATIVE mg/dL
Hgb urine dipstick: NEGATIVE
Ketones, ur: NEGATIVE mg/dL
Leukocytes, UA: NEGATIVE
Nitrite: NEGATIVE
Protein, ur: NEGATIVE mg/dL
Specific Gravity, Urine: 1.004 — ABNORMAL LOW (ref 1.005–1.030)
Urobilinogen, UA: 0.2 mg/dL (ref 0.0–1.0)
pH: 6 (ref 5.0–8.0)

## 2013-01-25 LAB — BLOOD GAS, ARTERIAL
Acid-base deficit: 0.3 mmol/L (ref 0.0–2.0)
Bicarbonate: 22.9 mEq/L (ref 20.0–24.0)
Drawn by: 344381
FIO2: 0.21 %
O2 Saturation: 98.5 %
Patient temperature: 98.6
TCO2: 23.9 mmol/L (ref 0–100)
pCO2 arterial: 31.4 mmHg — ABNORMAL LOW (ref 35.0–45.0)
pH, Arterial: 7.477 — ABNORMAL HIGH (ref 7.350–7.450)
pO2, Arterial: 101 mmHg — ABNORMAL HIGH (ref 80.0–100.0)

## 2013-01-25 LAB — PREPARE RBC (CROSSMATCH)

## 2013-01-25 LAB — PROTIME-INR
INR: 0.88 (ref 0.00–1.49)
Prothrombin Time: 11.8 seconds (ref 11.6–15.2)

## 2013-01-25 LAB — ABO/RH: ABO/RH(D): A POS

## 2013-01-25 LAB — SURGICAL PCR SCREEN
MRSA, PCR: NEGATIVE
Staphylococcus aureus: NEGATIVE

## 2013-01-25 LAB — APTT: aPTT: 29 seconds (ref 24–37)

## 2013-01-25 NOTE — Pre-Procedure Instructions (Signed)
Conita Amenta  01/25/2013   Your procedure is scheduled on:  Wed, Sept 17 @ 8:30 AM  Report to [facility] at 6:30 AM.  Call this number if you have problems the morning of surgery: [phone]   Remember:   Do not eat food or drink liquids after midnight.   Take these medicines the morning of surgery with A SIP OF WATER: Omeprazole(Prilosec),Valtrex(Valcyclovir-if needed),and Venlafaxine(Effexor)               No Goody's,BC's,Aleve,Aspirin,Ibupfrofen,Fish Oil,or any Herbal Medications   Do not wear jewelry, make-up or nail polish.  Do not wear lotions, powders, or perfumes. You may wear deodorant.  Do not shave 48 hours prior to surgery.   Do not bring valuables to the hospital.  [facility] is not responsible                   for any belongings or valuables.  Contacts, dentures or bridgework may not be worn into surgery.  Leave suitcase in the car. After surgery it may be brought to your room.  For patients admitted to the hospital, checkout time is 11:00 AM the day of  discharge.       Special Instructions: Shower using CHG 2 nights before surgery and the night before surgery.  If you shower the day of surgery use CHG.  Use special wash - you have one bottle of CHG for all showers.  You should use approximately 1/3 of the bottle for each shower.   Please read over the following fact sheets that you were given: Pain Booklet, Coughing and Deep Breathing, Blood Transfusion Information, MRSA Information and Surgical Site Infection Prevention

## 2013-01-25 NOTE — Progress Notes (Addendum)
Dr.Hochrein is cardiologist and only had to see him once with office visit in epic from 01-16-13  Stress test in epic from 01-21-13  Denies ever having an echo or heart cath  Dr>MAchenzie is Medical MD  EKG in epic from 01-16-13  Denies CXR in past yr

## 2013-01-29 ENCOUNTER — Encounter (HOSPITAL_COMMUNITY): Admission: RE | Payer: Self-pay | Source: Ambulatory Visit

## 2013-01-29 ENCOUNTER — Encounter: Payer: Self-pay | Admitting: *Deleted

## 2013-01-29 ENCOUNTER — Ambulatory Visit (HOSPITAL_COMMUNITY)
Admission: RE | Admit: 2013-01-29 | Payer: Private Health Insurance - Indemnity | Source: Ambulatory Visit | Admitting: Surgery

## 2013-01-29 SURGERY — ABDOMINAL AORTAGRAM
Anesthesia: LOCAL

## 2013-01-29 MED ORDER — DEXTROSE 5 % IV SOLN
1.5000 g | INTRAVENOUS | Status: DC
  Filled 2013-01-29: qty 1.5

## 2013-01-29 NOTE — Progress Notes (Signed)
This patient is a patient of Dr. Candie Chroman. I discussed this with him and forwarding this to you. A left liver nodule was noted on CT scan. Recommendation is for CT scan in 6 months. Can you please documented in the chart that we will get this done. ----- Message ----- From: Rad Results In Interface Sent: 01/24/2013 11:01 AM To: Nada Libman, MD    This message was sent to me from Dr. Myra Gianotti today. I will add the CT scan order and have our staff contact the patient in 6 months for followup.

## 2013-01-30 ENCOUNTER — Inpatient Hospital Stay (HOSPITAL_COMMUNITY)
Admission: RE | Admit: 2013-01-30 | Discharge: 2013-02-05 | DRG: 238 | Disposition: A | Discharge status: Home / Self Care | Payer: Private Health Insurance - Indemnity | Source: Ambulatory Visit | Attending: Vascular Surgery | Admitting: Vascular Surgery

## 2013-01-30 ENCOUNTER — Encounter (HOSPITAL_COMMUNITY)
Admission: RE | Disposition: A | Discharge status: Home / Self Care | Payer: Self-pay | Source: Ambulatory Visit | Attending: Vascular Surgery

## 2013-01-30 ENCOUNTER — Inpatient Hospital Stay (HOSPITAL_COMMUNITY): Payer: Private Health Insurance - Indemnity

## 2013-01-30 ENCOUNTER — Encounter (HOSPITAL_COMMUNITY): Payer: Self-pay | Admitting: Anesthesiology

## 2013-01-30 ENCOUNTER — Inpatient Hospital Stay (HOSPITAL_COMMUNITY): Payer: Private Health Insurance - Indemnity | Admitting: Anesthesiology

## 2013-01-30 DIAGNOSIS — I7409 Other arterial embolism and thrombosis of abdominal aorta: Principal | ICD-10-CM | POA: Diagnosis present

## 2013-01-30 DIAGNOSIS — F17200 Nicotine dependence, unspecified, uncomplicated: Secondary | ICD-10-CM | POA: Diagnosis present

## 2013-01-30 DIAGNOSIS — K560 Paralytic ileus: Secondary | ICD-10-CM | POA: Diagnosis not present

## 2013-01-30 DIAGNOSIS — E538 Deficiency of other specified B group vitamins: Secondary | ICD-10-CM | POA: Diagnosis present

## 2013-01-30 DIAGNOSIS — I70219 Atherosclerosis of native arteries of extremities with intermittent claudication, unspecified extremity: Secondary | ICD-10-CM

## 2013-01-30 DIAGNOSIS — F329 Major depressive disorder, single episode, unspecified: Secondary | ICD-10-CM | POA: Diagnosis present

## 2013-01-30 DIAGNOSIS — F411 Generalized anxiety disorder: Secondary | ICD-10-CM | POA: Diagnosis present

## 2013-01-30 DIAGNOSIS — K219 Gastro-esophageal reflux disease without esophagitis: Secondary | ICD-10-CM | POA: Diagnosis present

## 2013-01-30 DIAGNOSIS — E785 Hyperlipidemia, unspecified: Secondary | ICD-10-CM | POA: Diagnosis present

## 2013-01-30 DIAGNOSIS — I739 Peripheral vascular disease, unspecified: Secondary | ICD-10-CM | POA: Diagnosis present

## 2013-01-30 DIAGNOSIS — F3289 Other specified depressive episodes: Secondary | ICD-10-CM | POA: Diagnosis present

## 2013-01-30 HISTORY — PX: AORTA - BILATERAL FEMORAL ARTERY BYPASS GRAFT: SHX1175

## 2013-01-30 LAB — CBC
HCT: 34.6 % — ABNORMAL LOW (ref 36.0–46.0)
Hemoglobin: 11.8 g/dL — ABNORMAL LOW (ref 12.0–15.0)
MCH: 31.6 pg (ref 26.0–34.0)
MCHC: 34.1 g/dL (ref 30.0–36.0)
MCV: 92.8 fL (ref 78.0–100.0)
Platelets: 227 10*3/uL (ref 150–400)
RBC: 3.73 MIL/uL — ABNORMAL LOW (ref 3.87–5.11)
RDW: 12.8 % (ref 11.5–15.5)
WBC: 23 10*3/uL — ABNORMAL HIGH (ref 4.0–10.5)

## 2013-01-30 LAB — POCT I-STAT 3, ART BLOOD GAS (G3+)
Acid-base deficit: 2 mmol/L (ref 0.0–2.0)
Bicarbonate: 25.9 mEq/L — ABNORMAL HIGH (ref 20.0–24.0)
O2 Saturation: 85 %
Patient temperature: 97
TCO2: 28 mmol/L (ref 0–100)
pCO2 arterial: 53.4 mmHg — ABNORMAL HIGH (ref 35.0–45.0)
pH, Arterial: 7.289 — ABNORMAL LOW (ref 7.350–7.450)
pO2, Arterial: 54 mmHg — ABNORMAL LOW (ref 80.0–100.0)

## 2013-01-30 LAB — BASIC METABOLIC PANEL
BUN: 10 mg/dL (ref 6–23)
CO2: 26 mEq/L (ref 19–32)
Calcium: 7.9 mg/dL — ABNORMAL LOW (ref 8.4–10.5)
Chloride: 102 mEq/L (ref 96–112)
Creatinine, Ser: 0.73 mg/dL (ref 0.50–1.10)
GFR calc Af Amer: >90 mL/min (ref >90)
GFR calc non Af Amer: >90 mL/min (ref >90)
Glucose, Bld: 153 mg/dL — ABNORMAL HIGH (ref 70–99)
Potassium: 4.4 mEq/L (ref 3.5–5.1)
Sodium: 135 mEq/L (ref 135–145)

## 2013-01-30 LAB — LIPID PANEL
Cholesterol: 213 mg/dL — ABNORMAL HIGH (ref 0–200)
HDL: 31 mg/dL — ABNORMAL LOW (ref >39)
LDL Cholesterol: 146 mg/dL — ABNORMAL HIGH (ref 0–99)
Total CHOL/HDL Ratio: 6.9 RATIO
Triglycerides: 180 mg/dL — ABNORMAL HIGH (ref <150)
VLDL: 36 mg/dL (ref 0–40)

## 2013-01-30 LAB — PROTIME-INR
INR: 1.12 (ref 0.00–1.49)
Prothrombin Time: 14.2 seconds (ref 11.6–15.2)

## 2013-01-30 LAB — APTT: aPTT: 29 seconds (ref 24–37)

## 2013-01-30 LAB — MAGNESIUM: Magnesium: 1.7 mg/dL (ref 1.5–2.5)

## 2013-01-30 SURGERY — CREATION, BYPASS, ARTERIAL, AORTA TO FEMORAL, BILATERAL, USING GRAFT
Anesthesia: General | Site: Abdomen | Wound class: Clean

## 2013-01-30 MED ORDER — ONDANSETRON HCL 4 MG/2ML IJ SOLN
4.0000 mg | Freq: Four times a day (QID) | INTRAMUSCULAR | Status: DC | PRN
  Administered 2013-01-31: 4 mg via INTRAVENOUS
  Filled 2013-01-30 (×3): qty 2

## 2013-01-30 MED ORDER — PHENOL 1.4 % MT LIQD: 1.0000 | OROMUCOSAL | Status: DC | PRN

## 2013-01-30 MED ORDER — METOPROLOL TARTRATE 1 MG/ML IV SOLN: 2.0000 mg | INTRAVENOUS | Status: DC | PRN

## 2013-01-30 MED ORDER — NEOSTIGMINE METHYLSULFATE 1 MG/ML IJ SOLN
INTRAMUSCULAR | Status: DC | PRN
  Administered 2013-01-30: 4 mg via INTRAVENOUS

## 2013-01-30 MED ORDER — ACETAMINOPHEN 650 MG RE SUPP: 325.0000 mg | RECTAL | Status: DC | PRN

## 2013-01-30 MED ORDER — HYDRALAZINE HCL 20 MG/ML IJ SOLN
10.0000 mg | INTRAMUSCULAR | Status: DC | PRN
  Administered 2013-01-30: 10 mg via INTRAVENOUS

## 2013-01-30 MED ORDER — LIDOCAINE HCL (CARDIAC) 20 MG/ML IV SOLN
INTRAVENOUS | Status: DC | PRN
  Administered 2013-01-30: 50 mg via INTRAVENOUS

## 2013-01-30 MED ORDER — PROTAMINE SULFATE 10 MG/ML IV SOLN
INTRAVENOUS | Status: DC | PRN
  Administered 2013-01-30: 100 mg via INTRAVENOUS

## 2013-01-30 MED ORDER — DEXTROSE 5 % IV SOLN
1.5000 g | Freq: Two times a day (BID) | INTRAVENOUS | Status: AC
  Administered 2013-01-30 – 2013-01-31 (×2): 1.5 g via INTRAVENOUS
  Filled 2013-01-30 (×2): qty 1.5

## 2013-01-30 MED ORDER — ONDANSETRON HCL 4 MG/2ML IJ SOLN
INTRAMUSCULAR | Status: DC | PRN
  Administered 2013-01-30: 4 mg via INTRAVENOUS

## 2013-01-30 MED ORDER — SODIUM CHLORIDE 0.9 % IV SOLN: 500.0000 mL | Freq: Once | INTRAVENOUS | Status: AC | PRN

## 2013-01-30 MED ORDER — MANNITOL 25 % IV SOLN
INTRAVENOUS | Status: DC | PRN
  Administered 2013-01-30: 25 g via INTRAVENOUS

## 2013-01-30 MED ORDER — MAGNESIUM SULFATE 40 MG/ML IJ SOLN
2.0000 g | Freq: Every day | INTRAMUSCULAR | Status: DC | PRN
  Filled 2013-01-30: qty 50

## 2013-01-30 MED ORDER — POTASSIUM CHLORIDE CRYS ER 20 MEQ PO TBCR: 20.0000 meq | EXTENDED_RELEASE_TABLET | Freq: Every day | ORAL | Status: DC | PRN

## 2013-01-30 MED ORDER — HYDROMORPHONE HCL PF 1 MG/ML IJ SOLN
INTRAMUSCULAR | Status: AC
  Filled 2013-01-30: qty 1

## 2013-01-30 MED ORDER — SODIUM BICARBONATE 4.2 % IV SOLN
INTRAVENOUS | Status: DC | PRN
  Administered 2013-01-30: 25 meq via INTRAVENOUS

## 2013-01-30 MED ORDER — SODIUM CHLORIDE 0.9 % IV SOLN: INTRAVENOUS | Status: DC

## 2013-01-30 MED ORDER — METOPROLOL TARTRATE 1 MG/ML IV SOLN: 5.0000 mg | Freq: Four times a day (QID) | INTRAVENOUS | Status: DC

## 2013-01-30 MED ORDER — HYDROMORPHONE HCL PF 1 MG/ML IJ SOLN
0.2500 mg | INTRAMUSCULAR | Status: DC | PRN
  Administered 2013-01-30: 0.5 mg via INTRAVENOUS

## 2013-01-30 MED ORDER — ACETAMINOPHEN 325 MG PO TABS: 325.0000 mg | ORAL_TABLET | ORAL | Status: DC | PRN

## 2013-01-30 MED ORDER — LACTATED RINGERS IV SOLN
INTRAVENOUS | Status: DC | PRN
  Administered 2013-01-30 (×2): via INTRAVENOUS

## 2013-01-30 MED ORDER — HYDROMORPHONE HCL PF 1 MG/ML IJ SOLN
INTRAMUSCULAR | Status: DC | PRN
  Administered 2013-01-30 (×2): 0.5 mg via INTRAVENOUS

## 2013-01-30 MED ORDER — MORPHINE SULFATE 2 MG/ML IJ SOLN
2.0000 mg | INTRAMUSCULAR | Status: DC | PRN
  Administered 2013-01-30 – 2013-01-31 (×7): 2 mg via INTRAVENOUS
  Filled 2013-01-30 (×7): qty 1

## 2013-01-30 MED ORDER — MIDAZOLAM HCL 5 MG/5ML IJ SOLN
INTRAMUSCULAR | Status: DC | PRN
  Administered 2013-01-30 (×2): 2 mg via INTRAVENOUS

## 2013-01-30 MED ORDER — LABETALOL HCL 5 MG/ML IV SOLN
INTRAVENOUS | Status: DC | PRN
  Administered 2013-01-30 (×2): 10 mg via INTRAVENOUS

## 2013-01-30 MED ORDER — ROCURONIUM BROMIDE 100 MG/10ML IV SOLN
INTRAVENOUS | Status: DC | PRN
  Administered 2013-01-30: 50 mg via INTRAVENOUS

## 2013-01-30 MED ORDER — LACTATED RINGERS IV SOLN
INTRAVENOUS | Status: DC | PRN
  Administered 2013-01-30 (×2): via INTRAVENOUS

## 2013-01-30 MED ORDER — GLYCOPYRROLATE 0.2 MG/ML IJ SOLN
INTRAMUSCULAR | Status: DC | PRN
  Administered 2013-01-30: .8 mg via INTRAVENOUS

## 2013-01-30 MED ORDER — DEXTROSE 5 % IV SOLN
1.5000 g | INTRAVENOUS | Status: DC | PRN
  Administered 2013-01-30: 1.5 g via INTRAVENOUS

## 2013-01-30 MED ORDER — HYDRALAZINE HCL 20 MG/ML IJ SOLN
INTRAMUSCULAR | Status: AC
  Filled 2013-01-30: qty 1

## 2013-01-30 MED ORDER — SODIUM CHLORIDE 0.9 % IR SOLN
Status: DC | PRN
  Administered 2013-01-30: 08:00:00

## 2013-01-30 MED ORDER — LABETALOL HCL 5 MG/ML IV SOLN
10.0000 mg | INTRAVENOUS | Status: DC | PRN
  Filled 2013-01-30: qty 4

## 2013-01-30 MED ORDER — FENTANYL CITRATE 0.05 MG/ML IJ SOLN
INTRAMUSCULAR | Status: DC | PRN
  Administered 2013-01-30 (×2): 150 ug via INTRAVENOUS
  Administered 2013-01-30: 50 ug via INTRAVENOUS
  Administered 2013-01-30 (×4): 100 ug via INTRAVENOUS
  Administered 2013-01-30: 50 ug via INTRAVENOUS
  Administered 2013-01-30 (×3): 100 ug via INTRAVENOUS
  Administered 2013-01-30: 50 ug via INTRAVENOUS

## 2013-01-30 MED ORDER — 0.9 % SODIUM CHLORIDE (POUR BTL) OPTIME
TOPICAL | Status: DC | PRN
  Administered 2013-01-30: 2000 mL
  Administered 2013-01-30: 1000 mL

## 2013-01-30 MED ORDER — VECURONIUM BROMIDE 10 MG IV SOLR
INTRAVENOUS | Status: DC | PRN
  Administered 2013-01-30: 3 mg via INTRAVENOUS
  Administered 2013-01-30: 5 mg via INTRAVENOUS

## 2013-01-30 MED ORDER — PANTOPRAZOLE SODIUM 40 MG IV SOLR
40.0000 mg | Freq: Every day | INTRAVENOUS | Status: DC
  Administered 2013-01-30 – 2013-02-04 (×6): 40 mg via INTRAVENOUS
  Filled 2013-01-30 (×8): qty 40

## 2013-01-30 MED ORDER — HEPARIN SODIUM (PORCINE) 1000 UNIT/ML IJ SOLN
INTRAMUSCULAR | Status: DC | PRN
  Administered 2013-01-30: 6000 [IU] via INTRAVENOUS

## 2013-01-30 MED ORDER — NITROGLYCERIN 0.2 MG/ML ON CALL CATH LAB
INTRAVENOUS | Status: DC | PRN
  Administered 2013-01-30 (×6): 40 ug via INTRAVENOUS
  Administered 2013-01-30 (×2): 80 ug via INTRAVENOUS
  Administered 2013-01-30: 40 ug via INTRAVENOUS

## 2013-01-30 MED ORDER — DEXTROSE-NACL 5-0.9 % IV SOLN
INTRAVENOUS | Status: DC
  Administered 2013-01-30 – 2013-02-02 (×6): via INTRAVENOUS

## 2013-01-30 MED ORDER — ONDANSETRON HCL 4 MG/2ML IJ SOLN: 4.0000 mg | Freq: Once | INTRAMUSCULAR | Status: DC | PRN

## 2013-01-30 MED ORDER — ALBUMIN HUMAN 5 % IV SOLN
INTRAVENOUS | Status: DC | PRN
  Administered 2013-01-30 (×3): via INTRAVENOUS

## 2013-01-30 SURGICAL SUPPLY — 66 items
ATTRACTOMAT 16X20 MAGNETIC DRP (DRAPES) ×2 IMPLANT
BLADE SURG ROTATE 9660 (MISCELLANEOUS) ×2 IMPLANT
CANISTER SUCTION 2500CC (MISCELLANEOUS) ×2 IMPLANT
CLIP TI MEDIUM 24 (CLIP) ×2 IMPLANT
CLIP TI WIDE RED SMALL 24 (CLIP) ×2 IMPLANT
COVER SURGICAL LIGHT HANDLE (MISCELLANEOUS) ×2 IMPLANT
DERMABOND ADVANCED (GAUZE/BANDAGES/DRESSINGS) ×3
DERMABOND ADVANCED .7 DNX12 (GAUZE/BANDAGES/DRESSINGS) ×3 IMPLANT
DRAPE WARM FLUID 44X44 (DRAPE) ×2 IMPLANT
DRSG COVADERM 4X14 (GAUZE/BANDAGES/DRESSINGS) ×2 IMPLANT
DRSG COVADERM 4X6 (GAUZE/BANDAGES/DRESSINGS) ×6 IMPLANT
ELECT BLADE 6.5 EXT (BLADE) ×2 IMPLANT
ELECT REM PT RETURN 9FT ADLT (ELECTROSURGICAL) ×4
ELECTRODE REM PT RTRN 9FT ADLT (ELECTROSURGICAL) ×2 IMPLANT
GLOVE BIO SURGEON STRL SZ 6.5 (GLOVE) ×4 IMPLANT
GLOVE BIO SURGEON STRL SZ7 (GLOVE) ×2 IMPLANT
GLOVE BIOGEL PI IND STRL 6.5 (GLOVE) ×3 IMPLANT
GLOVE BIOGEL PI IND STRL 7.0 (GLOVE) ×4 IMPLANT
GLOVE BIOGEL PI IND STRL 7.5 (GLOVE) ×1 IMPLANT
GLOVE BIOGEL PI INDICATOR 6.5 (GLOVE) ×3
GLOVE BIOGEL PI INDICATOR 7.0 (GLOVE) ×4
GLOVE BIOGEL PI INDICATOR 7.5 (GLOVE) ×1
GLOVE SS BIOGEL STRL SZ 6.5 (GLOVE) ×2 IMPLANT
GLOVE SS BIOGEL STRL SZ 7 (GLOVE) ×1 IMPLANT
GLOVE SUPERSENSE BIOGEL SZ 6.5 (GLOVE) ×2
GLOVE SUPERSENSE BIOGEL SZ 7 (GLOVE) ×1
GOWN STRL NON-REIN LRG LVL3 (GOWN DISPOSABLE) ×12 IMPLANT
GRAFT HEMASHIELD 14X7MM (Vascular Products) ×2 IMPLANT
INSERT FOGARTY 61MM (MISCELLANEOUS) ×2 IMPLANT
INSERT FOGARTY SM (MISCELLANEOUS) ×4 IMPLANT
KIT BASIN OR (CUSTOM PROCEDURE TRAY) ×2 IMPLANT
KIT ROOM TURNOVER OR (KITS) ×2 IMPLANT
LOOP VESSEL MAXI BLUE (MISCELLANEOUS) IMPLANT
LOOP VESSEL MINI RED (MISCELLANEOUS) IMPLANT
NS IRRIG 1000ML POUR BTL (IV SOLUTION) ×6 IMPLANT
PACK AORTA (CUSTOM PROCEDURE TRAY) ×2 IMPLANT
PAD ARMBOARD 7.5X6 YLW CONV (MISCELLANEOUS) ×4 IMPLANT
SPONGE LAP 18X18 X RAY DECT (DISPOSABLE) IMPLANT
SPONGE LAP 4X18 X RAY DECT (DISPOSABLE) ×2 IMPLANT
STAPLER VISISTAT 35W (STAPLE) ×2 IMPLANT
SUT ETHIBOND 5 LR DA (SUTURE) ×4 IMPLANT
SUT PROLENE 1 XLH 60 (SUTURE) ×4 IMPLANT
SUT PROLENE 3 0 SH1 36 (SUTURE) ×4 IMPLANT
SUT PROLENE 4 0 RB 1 (SUTURE) ×1
SUT PROLENE 4-0 RB1 .5 CRCL 36 (SUTURE) ×1 IMPLANT
SUT PROLENE 5 0 C 1 24 (SUTURE) ×2 IMPLANT
SUT PROLENE 5 0 CC 1 (SUTURE) ×4 IMPLANT
SUT PROLENE 6 0 C 1 30 (SUTURE) ×2 IMPLANT
SUT SILK 2 0 (SUTURE) ×1
SUT SILK 2 0 SH CR/8 (SUTURE) ×2 IMPLANT
SUT SILK 2 0 TIES 17X18 (SUTURE) ×1
SUT SILK 2-0 18XBRD TIE 12 (SUTURE) ×1 IMPLANT
SUT SILK 2-0 18XBRD TIE BLK (SUTURE) ×1 IMPLANT
SUT SILK 3 0 (SUTURE) ×1
SUT SILK 3 0 TIES 17X18 (SUTURE) ×1
SUT SILK 3-0 18XBRD TIE 12 (SUTURE) ×1 IMPLANT
SUT SILK 3-0 18XBRD TIE BLK (SUTURE) ×1 IMPLANT
SUT VIC AB 2-0 CTX 36 (SUTURE) ×8 IMPLANT
SUT VIC AB 3-0 MH 27 (SUTURE) ×4 IMPLANT
SUT VIC AB 3-0 SH 27 (SUTURE) ×4
SUT VIC AB 3-0 SH 27X BRD (SUTURE) ×4 IMPLANT
SUT VICRYL 4-0 PS2 18IN ABS (SUTURE) ×4 IMPLANT
TOWEL OR 17X24 6PK STRL BLUE (TOWEL DISPOSABLE) ×4 IMPLANT
TOWEL OR 17X26 10 PK STRL BLUE (TOWEL DISPOSABLE) ×4 IMPLANT
TRAY FOLEY CATH 16FRSI W/METER (SET/KITS/TRAYS/PACK) ×2 IMPLANT
WATER STERILE IRR 1000ML POUR (IV SOLUTION) ×2 IMPLANT

## 2013-01-30 NOTE — Anesthesia Procedure Notes (Signed)
Procedure Name: Intubation Date/Time: 01/30/2013 8:38 AM Performed by: Whitman Hero Pre-anesthesia Checklist: Patient identified, Emergency Drugs available, Suction available, Patient being monitored and Timeout performed Patient Re-evaluated:Patient Re-evaluated prior to inductionOxygen Delivery Method: Circle system utilized Preoxygenation: Pre-oxygenation with 100% oxygen Intubation Type: IV induction Ventilation: Mask ventilation without difficulty Laryngoscope Size: Mac and 3 Grade View: Grade I Tube type: Oral Tube size: 7.5 mm Number of attempts: 1 Airway Equipment and Method: Stylet Placement Confirmation: ETT inserted through vocal cords under direct vision,  positive ETCO2 and breath sounds checked- equal and bilateral Secured at: 22 cm Tube secured with: Tape Dental Injury: Teeth and Oropharynx as per pre-operative assessment

## 2013-01-30 NOTE — H&P (View-Only) (Signed)
[facility] [place] HISTORY AND PHYSICAL   CC:  Pain in bilateral thighs (left > right) as well as some cramping in the right calf with walking and toes on the left have some numbness at times.  Mackenzie, Brian, MD  HPI: This is a 51 y.o. female who presents today with complaints of cramping like sensation in her bilateral thigh and buttocks with the left being worse than the right.  She states her symptoms have been ongoing for about 3 years, but have progressively worsened over time.  She states that when she is walking with weight (brief case, etc) her cramping is worse.  She states that without any weight, she does better, but after walking about a block, she has to stop and rest to relieve the pain.  She denies any non healing ulcers/sores.  She denies any symptoms of stroke; denies chest pain/pressure.  She does have a tobacco history of ~ 1ppd for about 35 years.  She states that she does have plates and screws in her left ankle from fib/tib Fx in the past.  Otherwise, she states she has been in good health.   Past Medical History  Diagnosis Date  . Hyperlipidemia   . GERD (gastroesophageal reflux disease)   . Genital herpes   . Vitamin B12 deficiency   . Anxiety   . Peripheral vascular disease    Past Surgical History  Procedure Laterality Date  . Ankle surgery      Allergies  Allergen Reactions  . Chantix [Varenicline]   . Fluvastatin Sodium   . Septra [Sulfamethoxazole-Tmp Ds]   . Simvastatin     Current Outpatient Prescriptions  Medication Sig Dispense Refill  . ALPRAZolam (XANAX) 0.5 MG tablet Take 0.5 mg by mouth at bedtime as needed for sleep.      . Ascorbic Acid (VITAMIN C) 1000 MG tablet Take 1,000 mg by mouth daily.      . Nutritional Supplements (BIFIDO-GENIC GROWTH FACTORS PO) Take 4 tablets by mouth daily.      . omeprazole (PRILOSEC) 20 MG capsule Take 20 mg by mouth as needed.       . valACYclovir (VALTREX) 500 MG tablet Take 500 mg  by mouth as needed.      . Venlafaxine HCl 75 MG TB24 Take 1 tablet by mouth daily.      . vitamin B-12 (CYANOCOBALAMIN) 1000 MCG tablet Take 1,000 mcg by mouth daily.       No current facility-administered medications for this visit.    Pt meds includes: Statin:  no (allergy to simvistatin) Beta Blocker:  no Aspirin:  no ACEI:  no ARB:  no Other Antiplatelet/Anticoagulant:  no  Family History  Problem Relation Age of Onset  . Hyperlipidemia Mother     History   Social History  . Marital Status: Married    Spouse Name: N/A    Number of Children: N/A  . Years of Education: N/A   Occupational History  . Not on file.   Social History Main Topics  . Smoking status: Current Every Day Smoker -- 0.75 packs/day for 30 years    Types: Cigarettes  . Smokeless tobacco: Never Used  . Alcohol Use: 1.5 - 2 oz/week    3-4 drink(s) per week     Comment: weekends only  . Drug Use: Not on file  . Sexual Activity: Not on file   Other Topics Concern  . Not on file   Social History Narrative  .   No narrative on file     ROS: [x] Positive   [ ] Negative   [ ] All sytems reviewed and are negative  Cardiovascular: [] chest pain/pressure [] palpitations [] SOB lying flat [] DOE [] pain in legs while walking [] pain in feet when lying flat [] hx of DVT [] hx of phlebitis [] swelling in legs [] varicose veins  Pulmonary: [] productive cough [] asthma [] wheezing  Neurologic: [] weakness in [] arms [] legs [] numbness in [] arms [] legs []difficulty speaking or slurred speech [] temporary loss of vision in one eye [] dizziness  Hematologic: [] bleeding problems [] problems with blood clotting easily  GI [] vomiting blood [] blood in stool  GU: [] burning with urination [] blood in urine  Psychiatric: [] hx of major depression  Integumentary: [] rashes [] ulcers  Constitutional: [] fever [] chills   PHYSICAL EXAMINATION:  Filed Vitals:   01/15/13  1342  BP: 129/89  Pulse: 80   Body mass index is 25.24 kg/(m^2).  General:  WDWN in NAD Gait: Not observed HENT: WNL, normocephalic Eyes: Pupils equal Pulmonary: normal non-labored breathing , without Rales, rhonchi,  wheezing Cardiac: RRR, without  Murmurs, rubs or gallops; without carotid bruits Abdomen: soft, NT, no masses Skin: without rashes, without ulcers  Vascular Exam/Pulses:bilateral femoral, popliteal and pedal pulses are absent. Extremities: without ischemic changes, without Gangrene , without cellulitis; without open wounds;  Musculoskeletal: no muscle wasting or atrophy  Neurologic: A&O X 3; Appropriate Affect ; SENSATION: normal; MOTOR FUNCTION:  moving all extremities equally. Speech is fluent/normal   Non-Invasive Vascular Imaging:  01/15/13 Bilateral Common Iliac artery occlusions Occlusion at the popliteal artery on the right  Outside facility ABI's as follows: Right 0.57 Left 0.52   ASSESSMENT: 51 y.o. 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  PLAN: -Given the severity of her iliac occlusions, we will schedule her for a nuclear stress test either later this week or next week.  -We will schedule her for an arteriogram with possible intervention by Dr. Brabham on 01/29/13.  This will most likely be unsuccessful given the nature of the occlusion.  If so, she will need a CTA of the abdomen and pelvis with runoff to further evaluate her anatomy.  She will most likely need an aortobifemoral bypass graft if Dr. Brabham is not able to balloon and stent the bilateral common iliac arteries. -she will f/u with Dr. Jazmaine Fuelling on 02/05/13 to discuss the results of the nuclear stress test as well as the arteriogram. -the pt is counseled on the importance of tobacco cessation as it can cause CVA/MI,PVD, etc and long term outcomes.  She states that she will stop smoking today.   Samantha Rhyne,  PA-C [facility] [phone]  Clinic MD:  Pt seen and examined in conjunction with Dr. 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 Brabham is unsuccessful in crossing these lesions and performing bilateral iliac PTA and stents hopefully can proceed with CT angiogram of abdomen and pelvis of same day while patient is at hospital and then likely will need aortobifemoral bypass graft Ablation would be a good candidate for this particularly at her   young age with no known history of coronary artery disease   

## 2013-01-30 NOTE — Interval H&P Note (Signed)
History and Physical Interval Note:  01/30/2013 8:07 AM  [name]  has presented today for surgery, with the diagnosis of AIOD  The various methods of treatment have been discussed with the patient and family. After consideration of risks, benefits and other options for treatment, the patient has consented to  Procedure(s): AORTA BIFEMORAL BYPASS GRAFT (Bilateral) as a surgical intervention .  The patient's history has been reviewed, patient examined, no change in status, stable for surgery.  I have reviewed the patient's chart and labs.  Questions were answered to the patient's satisfaction.     Josephina Gip

## 2013-01-30 NOTE — Anesthesia Postprocedure Evaluation (Signed)
  Anesthesia Post-op Note  Patient: [name]  Procedure(s) Performed: Procedure(s): AORTA BIFEMORAL BYPASS GRAFT (N/A)  Patient Location: PACU  Anesthesia Type:General  Level of Consciousness: awake, alert  and oriented  Airway and Oxygen Therapy: Patient Spontanous Breathing and Patient connected to nasal cannula oxygen  Post-op Pain: mild  Post-op Assessment: Post-op Vital signs reviewed, Patient's Cardiovascular Status Stable, Respiratory Function Stable, Patent Airway and Pain level controlled  Post-op Vital Signs: stable  Complications: No apparent anesthesia complications

## 2013-01-30 NOTE — Transfer of Care (Signed)
Immediate Anesthesia Transfer of Care Note  Patient: [name]  Procedure(s) Performed: Procedure(s): AORTA BIFEMORAL BYPASS GRAFT (N/A)  Patient Location: PACU  Anesthesia Type:General  Level of Consciousness: sedated, patient cooperative and responds to stimulation  Airway & Oxygen Therapy: Patient Spontanous Breathing and Patient connected to face mask  Post-op Assessment: Report given to PACU RN and Post -op Vital signs reviewed and stable  Post vital signs: Reviewed and stable  Complications: No apparent anesthesia complications

## 2013-01-30 NOTE — Anesthesia Preprocedure Evaluation (Addendum)
Anesthesia Evaluation  Patient identified by MRN, date of birth, ID band Patient awake    Reviewed: Allergy & Precautions, H&P , NPO status , Patient's Chart, lab work & pertinent test results  Airway Mallampati: II      Dental  (+) Teeth Intact and Dental Advisory Given   Pulmonary Current Smoker, former smoker,  breath sounds clear to auscultation        Cardiovascular + Peripheral Vascular Disease IRhythm:Regular     Neuro/Psych Anxiety Depression negative neurological ROS     GI/Hepatic   Endo/Other  negative endocrine ROS  Renal/GU negative Renal ROS  negative genitourinary   Musculoskeletal negative musculoskeletal ROS (+)   Abdominal   Peds  Hematology negative hematology ROS (+)   Anesthesia Other Findings   Reproductive/Obstetrics negative OB ROS                          Anesthesia Physical Anesthesia Plan  ASA: III  Anesthesia Plan: General   Post-op Pain Management:    Induction: Intravenous  Airway Management Planned: Oral ETT  Additional Equipment: Arterial line and CVP  Intra-op Plan:   Post-operative Plan: Extubation in OR  Informed Consent: I have reviewed the patients History and Physical, chart, labs and discussed the procedure including the risks, benefits and alternatives for the proposed anesthesia with the patient or authorized representative who has indicated his/her understanding and acceptance.   Dental advisory given  Plan Discussed with: Anesthesiologist and CRNA  Anesthesia Plan Comments:        Anesthesia Quick Evaluation

## 2013-01-30 NOTE — Progress Notes (Signed)
Obtained ABG from order placed in PACU. Results showed respiratory acidosis on 4L JAARS (see results tab). PA Dillingham paged to make aware. In interim, pt was placed on a venti mask for a PO2 of 53. TVO from Rene Kocher was to place pt back on 4L and no further action was warranted from ABG results. Will check follow up gas again in AM. Will monitor  Carey Bullocks, Jorge Ny

## 2013-01-30 NOTE — Op Note (Signed)
OPERATIVE REPORT  Date of Surgery: 01/30/2013  Surgeon: Josephina Gip, MD  Assistant: Dr. 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 Radial arterial line and a central venous sheath were inserted by anesthesia. The abdomen and groins were prepped with Betadine scrub and solution draped in routine sterile manner. Longitudinal incisions were made in both inguinal areas carried down through subcutaneous tissue. The common superficial and profunda femoris arteries were dissected free. The vessels were pulseless bilaterally but were soft normal-appearing arteries. Following this a midline incision was made from xiphoid to just below the umbilicus carried down through subcutaneous tissue and linea alba using the Bovie.  Cavity was entered thoroughly explored. The stomach duodenum small bowel and colon are unremarkable. The liver was smooth. No masses were palpable. A CT scan had revealed a 1.5 cm lesion in the left lobe of the liver but this was not evident or palpable. Gallbladder appears normal with no stones palpable. Uterus and adnexa are normal in appearance. Transverse colon was elevated and the intestines reflected to the right side retroperitoneum incised exposing the aorta from renal arteries to the bifurcation. 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 The left common iliac was a small or atretic-looking vessel the right common iliac had severe disease. Retroperitoneal  tunnels were created posterior to the ureters and the patient was given 25 g of mannitol and heparinized. 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 Was transected about 3 cm distal the renal arteries. Distal end was transected obliquely and oversewn with 2 layers of 3-0 Prolene. 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 It was a relatively small aorta. A 14 x 7 mm Hemashield Dacron graft was selected and the aorta was spatulated anteriorly as was the graft because of the size mismatch.End to  end anastomosis was done with 3-0 Prolene buttressing this with a strip of felt. Check for leaks none were present. End-to-side anastomoses were done to both common femoral arteries. Artery to me was made with a 15 blade extended with Potts scissors both femoral arteries appeared relatively free of disease good backbleeding from the superficial femorals.. End toside anastomoses were done with 5-0 Prolene right leg opened initially followed by the left leg following appropriate flushing and anastomoses completed. Following this there was excellent Doppler flow in both superficial femoral arteries and profunda femoris arteries. Protamine was given to reverse the heparin  Following this the groin was closed in layers of Vicryl in subcuticular fashion. Retroperitoneum was reapproximated with 3-0 Vicryl linea alba with #1 Prolene and the skin closed in a subcuticular fashion with 3 and 4-0 Vicryl and Dermabond. Patient taken to recovery in stable condition estimated blood loss 300 cc urinary output 200 cc no bank blood or cell saver blood was transfused.Josephina Gip, MD 01/30/2013 12:06 PM

## 2013-01-31 ENCOUNTER — Encounter (HOSPITAL_COMMUNITY): Payer: Self-pay | Admitting: Vascular Surgery

## 2013-01-31 ENCOUNTER — Inpatient Hospital Stay (HOSPITAL_COMMUNITY): Payer: Private Health Insurance - Indemnity

## 2013-01-31 ENCOUNTER — Telehealth: Payer: Self-pay | Admitting: Vascular Surgery

## 2013-01-31 DIAGNOSIS — I739 Peripheral vascular disease, unspecified: Secondary | ICD-10-CM

## 2013-01-31 DIAGNOSIS — Z48812 Encounter for surgical aftercare following surgery on the circulatory system: Secondary | ICD-10-CM

## 2013-01-31 LAB — COMPREHENSIVE METABOLIC PANEL
ALT: 18 U/L (ref 0–35)
AST: 21 U/L (ref 0–37)
Albumin: 3.1 g/dL — ABNORMAL LOW (ref 3.5–5.2)
Alkaline Phosphatase: 77 U/L (ref 39–117)
BUN: 6 mg/dL (ref 6–23)
CO2: 25 mEq/L (ref 19–32)
Calcium: 8.2 mg/dL — ABNORMAL LOW (ref 8.4–10.5)
Chloride: 100 mEq/L (ref 96–112)
Creatinine, Ser: 0.57 mg/dL (ref 0.50–1.10)
GFR calc Af Amer: >90 mL/min (ref >90)
GFR calc non Af Amer: >90 mL/min (ref >90)
Glucose, Bld: 145 mg/dL — ABNORMAL HIGH (ref 70–99)
Potassium: 3.8 mEq/L (ref 3.5–5.1)
Sodium: 134 mEq/L — ABNORMAL LOW (ref 135–145)
Total Bilirubin: 0.3 mg/dL (ref 0.3–1.2)
Total Protein: 5.8 g/dL — ABNORMAL LOW (ref 6.0–8.3)

## 2013-01-31 LAB — POCT I-STAT 3, ART BLOOD GAS (G3+)
Acid-Base Excess: 2 mmol/L (ref 0.0–2.0)
Bicarbonate: 27.5 mEq/L — ABNORMAL HIGH (ref 20.0–24.0)
O2 Saturation: 96 %
Patient temperature: 98.2
TCO2: 29 mmol/L (ref 0–100)
pCO2 arterial: 47.4 mmHg — ABNORMAL HIGH (ref 35.0–45.0)
pH, Arterial: 7.371 (ref 7.350–7.450)
pO2, Arterial: 84 mmHg (ref 80.0–100.0)

## 2013-01-31 LAB — CBC
HCT: 35.4 % — ABNORMAL LOW (ref 36.0–46.0)
Hemoglobin: 11.8 g/dL — ABNORMAL LOW (ref 12.0–15.0)
MCH: 30.9 pg (ref 26.0–34.0)
MCHC: 33.3 g/dL (ref 30.0–36.0)
MCV: 92.7 fL (ref 78.0–100.0)
Platelets: 219 10*3/uL (ref 150–400)
RBC: 3.82 MIL/uL — ABNORMAL LOW (ref 3.87–5.11)
RDW: 13 % (ref 11.5–15.5)
WBC: 13.9 10*3/uL — ABNORMAL HIGH (ref 4.0–10.5)

## 2013-01-31 LAB — MAGNESIUM: Magnesium: 1.9 mg/dL (ref 1.5–2.5)

## 2013-01-31 LAB — AMYLASE: Amylase: 37 U/L (ref 0–105)

## 2013-01-31 MED ORDER — FENTANYL CITRATE 0.05 MG/ML IJ SOLN
25.0000 ug | INTRAMUSCULAR | Status: DC | PRN
  Administered 2013-01-31 – 2013-02-04 (×23): 50 ug via INTRAVENOUS
  Filled 2013-01-31 (×23): qty 2

## 2013-01-31 MED ORDER — LORAZEPAM 2 MG/ML IJ SOLN: 0.5000 mg | Freq: Four times a day (QID) | INTRAMUSCULAR | Status: DC | PRN

## 2013-01-31 MED FILL — Heparin Sodium (Porcine) Inj 1000 Unit/ML: INTRAMUSCULAR | Qty: 30 | Status: AC

## 2013-01-31 MED FILL — Sodium Chloride IV Soln 0.9%: INTRAVENOUS | Qty: 1000 | Status: AC

## 2013-01-31 NOTE — Progress Notes (Signed)
Occupational Therapy Evaluation Patient Details Name: [name] MRN: [id number] DOB: 1961/03/10 Today's Date: 01/31/2013 Time: 4098-1191 OT Time Calculation (min): 36 min  OT Assessment / Plan / Recommendation History of present illness Pt is s/p aorta bifemoral bypass graft   Clinical Impression   PTA, pt independent with ADL and mobility. Pt with decreased independence with ADL and mobility due primarily to pain discomfort. Pt initially resistant to getting OOB. Educated pt on importance of mobility and positioning OOB in rehab process. Pt agreeable with good progress. Pt will have 24/7 assistance after D/C and will be able to D/C home when medically stable. Pt will benefit from skilled OT services to facilitate D/C to home due to below deficits.    OT Assessment  Patient needs continued OT Services    Follow Up Recommendations  No OT follow up    Barriers to Discharge      Equipment Recommendations  Tub/shower seat    Recommendations for Other Services    Frequency  Min 2X/week    Precautions / Restrictions Precautions Precautions: Fall   Pertinent Vitals/Pain HR 118 with activity. O2 desat to 91 RA.  Improved with relaxation breathing techniques    ADL  Eating/Feeding: Other (comment) (ice chips) Where Assessed - Eating/Feeding: Chair Grooming: Set up Where Assessed - Grooming: Supported sitting Upper Body Bathing: Minimal assistance Where Assessed - Upper Body Bathing: Supported sitting Lower Body Bathing: Maximal assistance Where Assessed - Lower Body Bathing: Supported sit to stand Upper Body Dressing: Minimal assistance Where Assessed - Upper Body Dressing: Supported sitting Lower Body Dressing: Maximal assistance Where Assessed - Lower Body Dressing: Supported sit to Pharmacist, hospital: Minimal Dentist Method: Other (comment) (ambulating - simulated) Acupuncturist: Other (comment) (bed - chair) Equipment Used: Rolling  walker Transfers/Ambulation Related to ADLs: min A. used RW. increased time due to pain ADL Comments: May benefit from AE. will further assess    OT Diagnosis: Generalized weakness;Acute pain  OT Problem List: Decreased activity tolerance;Decreased knowledge of use of DME or AE;Cardiopulmonary status limiting activity;Pain OT Treatment Interventions: Self-care/ADL training;DME and/or AE instruction;Therapeutic activities;Patient/family education   OT Goals(Current goals can be found in the care plan section) Acute Rehab OT Goals Patient Stated Goal: ro go home OT Goal Formulation: With patient Time For Goal Achievement: 02/14/13 Potential to Achieve Goals: Good  Visit Information  Last OT Received On: 01/31/13 Assistance Needed: +1 (+2 for bed mobility and poss chair follow for amb) PT/OT Co-Evaluation/Treatment: Yes (pt very anxious durng session. not moving well yet) History of Present Illness: Pt is s/p aorta bifemoral bypass graft       Prior Functioning     Home Living Family/patient expects to be discharged to:: Private residence Living Arrangements: Spouse/significant other Available Help at Discharge: Family Type of Home: House Home Access: Stairs to enter Secretary/administrator of Steps: 10 Entrance Stairs-Rails: Right Home Layout: Able to live on main level with bedroom/bathroom Home Equipment: None Prior Function Level of Independence: Independent Communication Communication: No difficulties Dominant Hand: Right         Vision/Perception Vision - History Baseline Vision: Wears glasses all the time   Cognition  Cognition Arousal/Alertness: Awake/alert Behavior During Therapy: Anxious Overall Cognitive Status: Within Functional Limits for tasks assessed    Extremity/Trunk Assessment Upper Extremity Assessment Upper Extremity Assessment: Overall WFL for tasks assessed Lower Extremity Assessment Lower Extremity Assessment: Overall WFL for tasks  assessed Cervical / Trunk Assessment Cervical / Trunk Assessment: Other exceptions (c/o  trunk discomfort form surgery)     Mobility Bed Mobility Bed Mobility: Supine to Sit;Sitting - Scoot to Edge of Bed Supine to Sit: 1: +2 Total assist;HOB elevated Supine to Sit: Patient Percentage: 40% Sitting - Scoot to Edge of Bed: 3: Mod assist Details for Bed Mobility Assistance: Patient very anxious for mobility, increased time to perform, pt reports increased pain with mobility  Transfers Sit to Stand: 4: Min assist Stand to Sit: With armrests;To chair/3-in-1 Details for Transfer Assistance: Increased time to perform, VCs for hand placement and safety with technique     Exercise Other Exercises Other Exercises: encourged use of incenetive spirometer 10x/hr   Balance Balance Balance Assessed: Yes Static Sitting Balance Static Sitting - Balance Support: Feet supported Static Sitting - Level of Assistance: 5: Stand by assistance Static Sitting - Comment/# of Minutes: 4 minutes at EOB, VCs for controlled breathing Static Standing Balance Static Standing - Balance Support: Bilateral upper extremity supported Static Standing - Level of Assistance: 5: Stand by assistance Static Standing - Comment/# of Minutes: 2 minutes   End of Session OT - End of Session Equipment Utilized During Treatment: Rolling walker;Oxygen Activity Tolerance: Patient limited by pain Patient left: in chair;with call bell/phone within reach;with family/visitor present Nurse Communication: Mobility status  GO     [name],[name] 01/31/2013, 12:17 PM [facility], OTR/L  [phone] 01/31/2013

## 2013-01-31 NOTE — Evaluation (Signed)
Physical Therapy Evaluation Patient Details Name: [name] MRN: [id number] DOB: 13-Sep-1960 Today's Date: 01/31/2013 Time: 4098-1191 PT Time Calculation (min): 36 min  PT Assessment / Plan / Recommendation History of Present Illness  Pt is s/p aorta bifemoral bypass graft  Clinical Impression  Patient demonstrates deficits in functional mobility as indicated below. Patient will benefit from skilled PT to address deficits and maximize function prior to discharge home with spouse. Will continue to see as indicated and progress activity as tolerated.     PT Assessment  Patient needs continued PT services    Follow Up Recommendations  Supervision/Assistance - 24 hour       Barriers to Discharge Inaccessible home environment 10 steps to enter the home    Equipment Recommendations  Rolling walker with 5" wheels       Frequency Min 3X/week    Precautions / Restrictions Precautions Precautions: Fall   Pertinent Vitals/Pain Pain (6/10) pattient  reports increased pain in abdomen area with mobility       Mobility  Bed Mobility Bed Mobility: Supine to Sit;Sitting - Scoot to Edge of Bed Supine to Sit: 1: +2 Total assist;HOB elevated Supine to Sit: Patient Percentage: 40% Sitting - Scoot to Edge of Bed: 3: Mod assist Details for Bed Mobility Assistance: Patient very anxious for mobility, increased time to perform, pt reports increased pain with mobility  Transfers Transfers: Sit to Stand;Stand to Sit Sit to Stand: 4: Min assist Stand to Sit: With armrests;To chair/3-in-1 Details for Transfer Assistance: Increased time to perform, VCs for hand placement and safety with technique Ambulation/Gait Ambulation/Gait Assistance: 4: Min guard Ambulation Distance (Feet): 7 Feet Assistive device: Rolling walker Ambulation/Gait Assistance Details: slow but steady despite pain        PT Diagnosis: Difficulty walking;Acute pain  PT Problem List: Decreased activity tolerance;Decreased  mobility;Pain PT Treatment Interventions: DME instruction;Gait training;Stair training;Functional mobility training;Therapeutic activities;Therapeutic exercise;Balance training;Patient/family education     PT Goals(Current goals can be found in the care plan section) Acute Rehab PT Goals Patient Stated Goal: ro go home PT Goal Formulation: With patient Time For Goal Achievement: 02/14/13 Potential to Achieve Goals: Good  Visit Information  Last PT Received On: 01/31/13 Assistance Needed: +1 (+2 for bed mobility and poss chair follow for amb) History of Present Illness: Pt is s/p aorta bifemoral bypass graft       Prior Functioning  Home Living Family/patient expects to be discharged to:: Private residence Living Arrangements: Spouse/significant other Available Help at Discharge: Family Type of Home: House Home Access: Stairs to enter Secretary/administrator of Steps: 10 Entrance Stairs-Rails: Right Home Layout: Able to live on main level with bedroom/bathroom Home Equipment: None Prior Function Level of Independence: Independent Communication Communication: No difficulties Dominant Hand: Right    Cognition  Cognition Arousal/Alertness: Awake/alert Behavior During Therapy: WFL for tasks assessed/performed Overall Cognitive Status: Within Functional Limits for tasks assessed    Extremity/Trunk Assessment Upper Extremity Assessment Upper Extremity Assessment: Defer to OT evaluation Lower Extremity Assessment Lower Extremity Assessment: Overall WFL for tasks assessed   Balance Balance Balance Assessed: Yes Static Sitting Balance Static Sitting - Balance Support: Feet supported Static Sitting - Level of Assistance: 5: Stand by assistance Static Sitting - Comment/# of Minutes: 4 minutes at EOB, VCs for controlled breathing Static Standing Balance Static Standing - Balance Support: Bilateral upper extremity supported Static Standing - Level of Assistance: 5: Stand by  assistance Static Standing - Comment/# of Minutes: 2 minutes  End of Session PT -  End of Session Equipment Utilized During Treatment: Oxygen Activity Tolerance: Patient limited by fatigue;Patient limited by pain (some nausea) Patient left: in chair;with call bell/phone within reach;with family/visitor present Nurse Communication: Mobility status  GP     Fabio Asa 01/31/2013, 9:51 AM Charlotte Crumb, PT DPT  [phone]

## 2013-01-31 NOTE — Progress Notes (Signed)
Offered to get pt out of bed to chair this morning.  Pt continues to complain about intermittent cramping and requests to stay in bed until MD or PA come around this morning.  Will continue to monitor pt.

## 2013-01-31 NOTE — Progress Notes (Addendum)
[facility] [place]  Post-op  Intra-abdominal Surgery note  Date of Surgery: 01/30/2013  Surgeon(s): Pryor Ochoa, MD Fransisco Hertz, MD  1 Day Post-Op Procedure(s): AORTA BIFEMORAL BYPASS GRAFT  History of Present Illness  [name] is a 52 y.o. female who is 1 Day Post-Op  Pt is doing well. C/O min nausea and spasms in incision incisional pain;  has not had flatus;has not had BM  IMAGING: Dg Chest Portable 1 View  01/30/2013   CLINICAL DATA:  Line placement.  EXAM: PORTABLE CHEST - 1 VIEW  COMPARISON:  01/25/2013  FINDINGS: Right internal jugular vascular sheath has in place with the tip near the confluence of the innominate veins. No pneumothorax. NG tube enters the stomach.  Diffuse interstitial prominence throughout the lungs, increasing, possibly mild interstitial edema. Low lung volumes without confluent opacity or effusion.  IMPRESSION: Right vascular sheath tip near the confluence of the innominate veins. No pneumothorax. NG tube is in the stomach.  Increasing interstitial prominence, question interstitial edema.   Electronically Signed   By: Charlett Nose M.D.   On: 01/30/2013 14:36    Significant Diagnostic Studies: CBC Lab Results  Component Value Date   WBC 13.9* 01/31/2013   HGB 11.8* 01/31/2013   HCT 35.4* 01/31/2013   MCV 92.7 01/31/2013   PLT 219 01/31/2013    BMET    Component Value Date/Time   NA 134* 01/31/2013 0445   K 3.8 01/31/2013 0445   CL 100 01/31/2013 0445   CO2 25 01/31/2013 0445   GLUCOSE 145* 01/31/2013 0445   BUN 6 01/31/2013 0445   CREATININE 0.57 01/31/2013 0445   CALCIUM 8.2* 01/31/2013 0445   GFRNONAA >90 01/31/2013 0445   GFRAA >90 01/31/2013 0445    COAG Lab Results  Component Value Date   INR 1.12 01/30/2013   INR 0.88 01/25/2013   No results found for this basename: PTT    I/O last 3 completed shifts: In: 6505 [I.V.:5525; NG/GT:180; IV Piggyback:800] Out: 1925 [Urine:1575; Blood:350]    Physical Examination BP  Readings from Last 3 Encounters:  01/31/13 138/72  01/31/13 138/72  01/25/13 138/89   Temp Readings from Last 3 Encounters:  01/30/13 98.2 F (36.8 C) Oral  01/30/13 98.2 F (36.8 C) Oral  01/25/13 98.2 F (36.8 C) Oral   SpO2 Readings from Last 3 Encounters:  01/31/13 100%  01/31/13 100%  01/25/13 99%   Pulse Readings from Last 3 Encounters:  01/31/13 73  01/31/13 73  01/25/13 91    General: A&O x 3, WDWN female in NAD Pulmonary: normal non-labored breathing , without Rales, rhonchi,  wheezing Cardiac: Heart rate : regular ,  Abdomen:abdomen soft, non-tender and no BS Abdominal wound:clean, dry, intact  Neurologic: A&O X 3; Appropriate Affect ;  Speech is fluent/normal  Vascular Exam:BLE warm and well perfused Extremities without ischemic changes, no Gangrene, no cellulitis; no open wounds;   LOWER EXTREMITY PULSES           RIGHT                                      LEFT      POSTERIOR TIBIAL  monophasic by Doppler  palpable       DORSALIS PEDIS      ANTERIOR TIBIAL  absent  palpable       PERONEAL  monophasic by Doppler  monophasic  by Doppler    Non-Invasive Vascular Imaging ABI'S:pending  Assessment/Plan: [name] is a 52 y.o. female who is 1 Day Post-Op Procedure(s): AORTA BIFEMORAL BYPASS GRAFT Abdominal spasms DC NGT - min drainage Transfer to 2000  begin PT DC aline Foley for strict I/O may DC later today or tomorrow   ROCZNIAK,REGINA J  01/31/2013 7:48 AM  Agree with above assessment 3+ femoral pulses palpable bilaterally DP and PT palpable in left foot-right foot warm and well perfused with excellent PT Doppler flow-patient has right popliteal occlusion Abdomen relatively soft  Plan DC NG and to keep strict n.p.o. will transfer to 2000 today-begin ambulation Doing well one day post aortobifemoral bypass graft

## 2013-01-31 NOTE — Progress Notes (Signed)
VASCULAR LAB PRELIMINARY  ARTERIAL  ABI completed:    RIGHT    LEFT    PRESSURE WAVEFORM  PRESSURE WAVEFORM  BRACHIAL 154 Triphasic BRACHIAL 155   Triphasic  DP   DP 174 Triphasic  AT 96 Monophasic AT    PT 78 Monophasic PT 173 Triphasic    RIGHT LEFT  ABI 0.62 1.12   ABIs on the right indicate a moderate reduction in arterial flow. Left ABIs indicate normal arterial flow  Eveline Sauve, RVS 01/31/2013, 7:30 PM

## 2013-01-31 NOTE — Telephone Encounter (Signed)
Message copied by Jena Gauss on Thu Jan 31, 2013  1:19 PM ------      Message from: Melene Plan      Created: Wed Jan 30, 2013 12:11 PM                   ----- Message -----         From: Marlowe Shores, PA-C         Sent: 01/30/2013  12:08 PM           To: Melene Plan, RN, [facility] Admin Pool            Dr Hart Rochester wants to see this pt in 2-3 weeks and if ABI's done in the hospital then we do not need them at this 1st visit ------

## 2013-02-01 LAB — TYPE AND SCREEN
ABO/RH(D): A POS
Antibody Screen: NEGATIVE
Unit division: 0
Unit division: 0

## 2013-02-01 MED ORDER — BISACODYL 10 MG RE SUPP
10.0000 mg | Freq: Once | RECTAL | Status: DC
  Filled 2013-02-01: qty 1

## 2013-02-01 MED ORDER — ATORVASTATIN CALCIUM 10 MG PO TABS
10.0000 mg | ORAL_TABLET | Freq: Every day | ORAL | Status: DC
  Administered 2013-02-04: 10 mg via ORAL
  Filled 2013-02-01 (×4): qty 1

## 2013-02-01 MED ORDER — VENLAFAXINE HCL ER 75 MG PO CP24
75.0000 mg | ORAL_CAPSULE | Freq: Every day | ORAL | Status: DC
  Administered 2013-02-05: 75 mg via ORAL
  Filled 2013-02-01 (×6): qty 1

## 2013-02-01 MED ORDER — BISACODYL 10 MG RE SUPP
10.0000 mg | Freq: Once | RECTAL | Status: AC
  Administered 2013-02-01: 10 mg via RECTAL
  Filled 2013-02-01: qty 1

## 2013-02-01 MED ORDER — VENLAFAXINE HCL ER 75 MG PO TB24: 1.0000 | ORAL_TABLET | Freq: Every day | ORAL | Status: DC

## 2013-02-01 NOTE — Progress Notes (Signed)
Physical Therapy Treatment Patient Details Name: [name] MRN: [id number] DOB: [birth year] Today's Date: 02/01/2013 Time: 7829-5621 PT Time Calculation (min): 31 min  PT Assessment / Plan / Recommendation  History of Present Illness Pt is s/p aorta bifemoral bypass graft   PT Comments   Patient demonstrates progress towards PT goals today. Patient was able to ambulate in hall today with supervision. Encouraged patient to ambulate at least 3x a day.  Patient still requiring increased assist with bed mobility secondary to pain. Will continue to work with patient on techniques and progress activity as tolerated.   Follow Up Recommendations  Supervision/Assistance - 24 hour     Does the patient have the potential to tolerate intense rehabilitation     Barriers to Discharge        Equipment Recommendations  Rolling walker with 5" wheels    Recommendations for Other Services    Frequency Min 3X/week   Progress towards PT Goals Progress towards PT goals: Progressing toward goals  Plan Current plan remains appropriate    Precautions / Restrictions Precautions Precautions: Fall Restrictions Weight Bearing Restrictions: No   Pertinent Vitals/Pain 5/10 pain, SpO2 on rm air 97%    Mobility  Bed Mobility Bed Mobility: Supine to Sit;Sitting - Scoot to Edge of Bed;Sit to Supine Supine to Sit: 3: Mod assist;HOB flat;With rails Sitting - Scoot to Edge of Bed: 3: Mod assist Sit to Supine: 3: Mod assist Details for Bed Mobility Assistance: Pt continues to be anxious with bed mobility but did move better today. Transfers Transfers: Sit to Stand;Stand to Sit Sit to Stand: 4: Min guard;From bed Stand to Sit: 4: Min guard;To bed Details for Transfer Assistance: Increased time to perform, VCs for hand placement and safety with technique Ambulation/Gait Ambulation/Gait Assistance: 5: Supervision Ambulation Distance (Feet): 140 Feet Assistive device: Rolling walker Ambulation/Gait  Assistance Details: slow but steady despite pain    Exercises Other Exercises Other Exercises: encouraged pt to move L shoulder as she was c/o pain that was not there before.  Also encouraged pt to walk as much as possible over the weekend with nursing.     PT Goals (current goals can now be found in the care plan section) Acute Rehab PT Goals Patient Stated Goal: to go home. PT Goal Formulation: With patient Time For Goal Achievement: 02/14/13 Potential to Achieve Goals: Good  Visit Information  Last PT Received On: 02/01/13 Assistance Needed: +1 History of Present Illness: Pt is s/p aorta bifemoral bypass graft    Subjective Data  Subjective: the pain is a little better Patient Stated Goal: to go home.   Cognition  Cognition Arousal/Alertness: Awake/alert Behavior During Therapy: WFL for tasks assessed/performed Overall Cognitive Status: Within Functional Limits for tasks assessed    Balance  Balance Balance Assessed: Yes Static Standing Balance Static Standing - Balance Support: No upper extremity supported;During functional activity Static Standing - Level of Assistance: 5: Stand by assistance Static Standing - Comment/# of Minutes: 4 minutes  End of Session PT - End of Session Equipment Utilized During Treatment: Gait belt Activity Tolerance: Patient tolerated treatment well;Patient limited by pain Patient left: in bed;with call bell/phone within reach;with family/visitor present Nurse Communication: Mobility status   GP     Fabio Asa 02/01/2013, 12:03 PM Charlotte Crumb, PT DPT  [phone]

## 2013-02-01 NOTE — Progress Notes (Addendum)
[facility] [place]  Post-op  Intra-abdominal Surgery note  Date of Surgery: 01/30/2013  Surgeon(s): Pryor Ochoa, MD Fransisco Hertz, MD  2 Days Post-Op Procedure(s): AORTA BIFEMORAL BYPASS GRAFT  History of Present Illness  [name] is a 52 y.o. female who is 2 Days Post-Op  Pt is doing well. complains of incisional pain but much better today on fentanyl; denies nausea/vomiting; denies diarrhea. has not had flatus;has not had BM  Pt states she tried Lipitor in past and this was stopped because of "leg" problems. Would like to try this again to see if with bypass the leg symptoms have improved and she may be able to tol Lipitor  Lipid panel shows High LDL, Cholesterol and triglycerides   I/O last 3 completed shifts: In: 1980 [I.V.:1760; NG/GT:120; IV Piggyback:100] Out: 3885 [Urine:3885]    Physical Examination BP Readings from Last 3 Encounters:  02/01/13 149/80  02/01/13 149/80  01/25/13 138/89   Temp Readings from Last 3 Encounters:  02/01/13 98.8 F (37.1 C) Oral  02/01/13 98.8 F (37.1 C) Oral  01/25/13 98.2 F (36.8 C) Oral   SpO2 Readings from Last 3 Encounters:  02/01/13 97%  02/01/13 97%  01/25/13 99%   Pulse Readings from Last 3 Encounters:  02/01/13 105  02/01/13 105  01/25/13 91   General: A&O x 3, WDWN female in NAD Pulmonary: normal non-labored breathing , without Rales, rhonchi,  wheezing Cardiac: Heart rate : regular ,  Abdomen:abdomen soft, quiet Abdominal wound:clean, dry, intact  And ecchymotic  Neurologic: A&O X 3; Appropriate Affect ;  Speech is fluent/normal  Vascular Exam:BLE warm and well perfused Extremities without ischemic changes, no Gangrene, no cellulitis; no open wounds;   LOWER EXTREMITY PULSES           RIGHT                                      LEFT      POSTERIOR TIBIAL  monophasic by Doppler  palpable       DORSALIS PEDIS      ANTERIOR TIBIAL  absent  palpable    Non-Invasive Vascular  Imaging ABI'S: Right 0.62; Left 1.12  Assessment/Plan: [name] is a 52 y.o. female who is 2 Days Post-Op Procedure(s): AORTA BIFEMORAL BYPASS GRAFT Pt doing better today - pain better controlled Post-op ileus without N/V - will give dulcolax supp today Hyperlipidemia - try lipitor again to see if pt previous leg symptoms return Foley out Ambulate Will begin diet if begins to pass flatus   [name]  02/01/2013 9:20 AM   No gut function yet Right foot warm Palpable left DP Incisions clean and healing Ambulate  Progress diet as gut function returns  Fabienne Bruns, MD [facility] [place] Office: [phone] Pager: [phone]

## 2013-02-01 NOTE — Care Management Note (Unsigned)
    Page 1 of 1   02/01/2013     4:54:07 PM   CARE MANAGEMENT NOTE 02/01/2013  Patient:  [name], [name]   Account Number:  [id number]  Date Initiated:  02/01/2013  Documentation initiated by:  Karlisha Mathena  Subjective/Objective Assessment:   PT ADM S/P AORTO BIFEM BYPASS ON 01/30/13.  PTA, PT INDEPENDENT, LIVES WITH SPOUSE.     Action/Plan:   P.T. /OT RECOMMENDING FOLLOW UP THERAPIES, DME AT DC.   Anticipated DC Date:  02/02/2013   Anticipated DC Plan:  HOME W HOME HEALTH SERVICES      DC Planning Services  CM consult      Choice offered to / List presented to:             Status of service:  In process, will continue to follow Medicare Important Message given?   (If response is "NO", the following Medicare IM given date fields will be blank) Date Medicare IM given:   Date Additional Medicare IM given:    Discharge Disposition:    Per UR Regulation:  Reviewed for med. necessity/level of care/duration of stay  If discussed at Long Length of Stay Meetings, dates discussed:    Comments:  02/01/13 Myron Stankovich,RN,BSN [phone] PLEASE LEAVE ORDERS FOR HH AND DME AS RECOMMENDED.  RN CASE MGR WILL ARRANGE.  THANKS.

## 2013-02-01 NOTE — Progress Notes (Signed)
Occupational Therapy Treatment Patient Details Name: [name] MRN: [id number] DOB: 08-05-1960 Today's Date: 02/01/2013 Time: 4098-1191 OT Time Calculation (min): 32 min  OT Assessment / Plan / Recommendation  History of present illness Pt is s/p aorta bifemoral bypass graft   OT comments  Pt progressing well and less anxious today.  Pt would benefit from adaptive equipment training.  Follow Up Recommendations  Home health OT;Supervision/Assistance - 24 hour    Barriers to Discharge       Equipment Recommendations  3 in 1 bedside comode;Other (comment) (does not need shower seat.)    Recommendations for Other Services    Frequency Min 2X/week   Progress towards OT Goals Progress towards OT goals: Progressing toward goals  Plan Discharge plan remains appropriate    Precautions / Restrictions Precautions Precautions: Fall Restrictions Weight Bearing Restrictions: No   Pertinent Vitals/Pain Pt c/o 5/10 pain in abdomen.  Vitals stable.    ADL  Grooming: Performed;Set up Where Assessed - Grooming: Unsupported standing Upper Body Bathing: Simulated;Minimal assistance Where Assessed - Upper Body Bathing: Unsupported sitting Lower Body Bathing: Simulated;Moderate assistance Where Assessed - Lower Body Bathing: Supported sit to stand Upper Body Dressing: Performed;Set up Where Assessed - Upper Body Dressing: Unsupported sitting Lower Body Dressing: Maximal assistance Where Assessed - Lower Body Dressing: Supported sit to stand Toilet Transfer: Performed;Min guard Statistician Method: Other (comment) (ambulated) Acupuncturist: Materials engineer and Hygiene: Simulated;Minimal assistance Where Assessed - Engineer, mining and Hygiene: Standing Equipment Used: Rolling walker Transfers/Ambulation Related to ADLs: min guard.  Pt walked in hallway with S. ADL Comments: Pt has difflculty with LE adls.  Pt would not  attempt to cross her legs due to pain.  Pt would benefit from AE education and will need this from [facility] if d/c's over the weekend.    OT Diagnosis:    OT Problem List:   OT Treatment Interventions:     OT Goals(current goals can now be found in the care plan section) Acute Rehab OT Goals Patient Stated Goal: to go home. OT Goal Formulation: With patient Time For Goal Achievement: 02/14/13 Potential to Achieve Goals: Good ADL Goals Pt Will Transfer to Toilet: with supervision;ambulating Pt Will Perform Toileting - Clothing Manipulation and hygiene: with modified independence;sit to/from stand Pt Will Perform Tub/Shower Transfer: with supervision;with caregiver independent in assisting;shower seat;ambulating Additional ADL Goal #1: pt/family will verbalize use of compensatory techniques/AE for LB ADL  Visit Information  Last OT Received On: 02/01/13 Assistance Needed: +1 PT/OT Co-Evaluation/Treatment: Yes (Pt more I today than yesterday.  No need for further Co txs.) History of Present Illness: Pt is s/p aorta bifemoral bypass graft    Subjective Data      Prior Functioning       Cognition  Cognition Arousal/Alertness: Awake/alert Behavior During Therapy: WFL for tasks assessed/performed Overall Cognitive Status: Within Functional Limits for tasks assessed    Mobility  Bed Mobility Bed Mobility: Supine to Sit;Sitting - Scoot to Edge of Bed;Sit to Supine Supine to Sit: 3: Mod assist;HOB flat;With rails Sitting - Scoot to Edge of Bed: 3: Mod assist Sit to Supine: 3: Mod assist Details for Bed Mobility Assistance: Pt continues to be anxious with bed mobility but did move better today. Transfers Transfers: Sit to Stand;Stand to Sit Sit to Stand: 4: Min guard;From bed Stand to Sit: 4: Min guard;To bed Details for Transfer Assistance: Increased time to perform, VCs for hand placement and safety with technique  Exercises  Other Exercises Other Exercises: encouraged pt to  move L shoulder as she was c/o pain that was not there before.  Also encouraged pt to walk as much as possible over the weekend with nursing.   Balance     End of Session OT - End of Session Equipment Utilized During Treatment: Rolling walker Activity Tolerance: Patient tolerated treatment well Patient left: in bed;with call bell/phone within reach Nurse Communication: Mobility status  GO     Hope Budds 02/01/2013, 11:49 AM [phone]

## 2013-02-02 NOTE — Progress Notes (Signed)
Pt amb 250 ft pushing walker with family. Pt with no complaints. Will continue to monitor pt closely.

## 2013-02-02 NOTE — Progress Notes (Signed)
Pt. Ambulated 250 feet in the hallway via front wheel walker. Pt. stopped X's 3 due to gas and shortness of breath. Patient returned to bed and given pain medication for pain. Will continue to monitor.     Chesley Mires R

## 2013-02-02 NOTE — Progress Notes (Signed)
[facility] [place]  Subjective  - feels a little stronger today walked some yesterday, some flatus   Objective 127/74 93 98.9 F (37.2 C) (Oral) 18 95%  Intake/Output Summary (Last 24 hours) at 02/02/13 0920 Last data filed at 02/02/13 0700  Gross per 24 hour  Intake   3300 ml  Output    400 ml  Net   2900 ml   Left foot 2+ dp pulse Right foot absent pulse but similar temp and appearance  ABI reviewed 0.5 right 1 left  Assessment/Planning: Clear liquids today Ambulate  [name],[name] 02/02/2013 9:20 AM --  Laboratory Lab Results:  Recent Labs  01/30/13 1240 01/31/13 0445  WBC 23.0* 13.9*  HGB 11.8* 11.8*  HCT 34.6* 35.4*  PLT 227 219   BMET  Recent Labs  01/30/13 1240 01/31/13 0445  NA 135 134*  K 4.4 3.8  CL 102 100  CO2 26 25  GLUCOSE 153* 145*  BUN 10 6  CREATININE 0.73 0.57  CALCIUM 7.9* 8.2*    COAG Lab Results  Component Value Date   INR 1.12 01/30/2013   INR 0.88 01/25/2013   No results found for this basename: PTT

## 2013-02-03 NOTE — Progress Notes (Signed)
RIJ sleeve removed. Occlusive dsg and pressure applied.  Pt instructed to remain flat and in bed for 30 mins. Pt verbalized understanding.  Will continue to monitor pt closely.

## 2013-02-03 NOTE — Progress Notes (Signed)
[facility] [place]  Subjective  - had BM yesterday, walking more daily, variety of aches and pains left shoulder, toes with pins and needles bilat, right inner thigh numbness   Objective 124/77 84 98.2 F (36.8 C) (Oral) 19 94%  Intake/Output Summary (Last 24 hours) at 02/03/13 0935 Last data filed at 02/03/13 0600  Gross per 24 hour  Intake   2085 ml  Output      0 ml  Net   2085 ml   Abdomen incision healing, soft + bs Groin incsions healing Left DP 1-2+ Right DP PT absent but brisk monophasic doppler  Assessment/Planning: POD #4 from aorto bifem Continued progress Advance diet tolerated some clears but overall still low PO intake Ambulate Possible d/c tomorrow or Tuesday D/c swan sleeve   Nadja Lina E 02/03/2013 9:35 AM --  Laboratory Lab Results: No results found for this basename: WBC, HGB, HCT, PLT,  in the last 72 hours BMET No results found for this basename: NA, K, CL, CO2, GLUCOSE, BUN, CREATININE, CALCIUM,  in the last 72 hours  COAG Lab Results  Component Value Date   INR 1.12 01/30/2013   INR 0.88 01/25/2013   No results found for this basename: PTT

## 2013-02-03 NOTE — Progress Notes (Signed)
Pt amb 575ft pushing rolling walker with family. Pt did not have any complaints. Will continue to monitor pt closely.

## 2013-02-04 MED ORDER — ATORVASTATIN CALCIUM 10 MG PO TABS: 10.0000 mg | ORAL_TABLET | Freq: Every day | ORAL | Status: DC

## 2013-02-04 MED ORDER — OXYCODONE HCL 5 MG PO TABS
5.0000 mg | ORAL_TABLET | Freq: Four times a day (QID) | ORAL | Status: DC | PRN
  Administered 2013-02-04 (×2): 5 mg via ORAL
  Filled 2013-02-04 (×2): qty 1

## 2013-02-04 MED ORDER — BISACODYL 10 MG RE SUPP
10.0000 mg | Freq: Once | RECTAL | Status: AC
  Administered 2013-02-04: 10 mg via RECTAL
  Filled 2013-02-04: qty 1

## 2013-02-04 MED ORDER — ENOXAPARIN SODIUM 40 MG/0.4ML ~~LOC~~ SOLN
40.0000 mg | SUBCUTANEOUS | Status: DC
  Administered 2013-02-04: 40 mg via SUBCUTANEOUS
  Filled 2013-02-04 (×2): qty 0.4

## 2013-02-04 MED ORDER — ASPIRIN 81 MG PO CHEW
81.0000 mg | CHEWABLE_TABLET | Freq: Every day | ORAL | Status: DC
  Administered 2013-02-04: 81 mg via ORAL
  Filled 2013-02-04: qty 1

## 2013-02-04 NOTE — Discharge Summary (Signed)
[facility] Discharge Summary   Patient ID:  [name] MRN: [id number] DOB/AGE: 02/02/1961 52 y.o.  Admit date: 01/30/2013 Discharge date: 02/05/2013 Date of Surgery: 01/30/2013 Surgeon: Surgeon(s): Pryor Ochoa, MD Fransisco Hertz, MD  Admission Diagnosis: AIOD  Discharge Diagnoses:  AIOD  Secondary Diagnoses: Past Medical History  Diagnosis Date  . Hyperlipidemia     not on meds at present and Dr.McKenzie will review after surgery  . Genital herpes   . Vitamin B12 deficiency   . Peripheral vascular disease   . GERD (gastroesophageal reflux disease)     takes Omeprazole daily  . Anxiety     takes Xanax prn   . Depression     takes Effexor daily  . History of bronchitis     last time 3months ago  . History of migraine     last one 14yrs ago  . Weakness     bil legs  . Back pain     DDD  . Hemorrhoids   . Constipation   . History of colon polyps   . Insomnia     Procedure(s): AORTA BIFEMORAL BYPASS GRAFT  Discharged Condition: good  HPI:  [name] is a 52 y.o. female  who presented with complaints of cramping like sensation in her bilateral thigh and buttocks with the left being worse than the right. She states her symptoms have been ongoing for about 3 years, but have progressively worsened over time. She states that when she is walking with weight (brief case, etc) her cramping is worse. She states that without any weight, she does better, but after walking about a block, she has to stop and rest to relieve the pain. She denies any non healing ulcers/sores. She denies any symptoms of stroke; denies chest pain/pressure. She does have a tobacco history of ~ 1ppd for about 35 years. She states that she does have plates and screws in her left ankle from fib/tib Fx in the past. Otherwise, she states she has been in good health. 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 female is S/P  Procedure(s): AORTA BIFEMORAL BYPASS GRAFT Extubated: POD # 0 Physical exam: abdomen soft, NABS Palpable PT/DP on the left; monophasic PT/peroneal and absent DP on right Post-op wounds healing well Pt. Ambulating, voiding and taking PO diet without difficulty. Pt pain controlled with PO pain meds. Labs as below Complications:none  Consults:     Significant Diagnostic Studies: CBC Lab Results  Component Value Date   WBC 13.9* 01/31/2013   HGB 11.8* 01/31/2013   HCT 35.4* 01/31/2013   MCV 92.7 01/31/2013   PLT 219 01/31/2013    BMET    Component Value Date/Time   NA 134* 01/31/2013 0445   K 3.8 01/31/2013 0445   CL 100 01/31/2013 0445   CO2 25 01/31/2013 0445   GLUCOSE 145* 01/31/2013 0445   BUN 6 01/31/2013 0445   CREATININE 0.57 01/31/2013 0445   CALCIUM 8.2* 01/31/2013 0445   GFRNONAA >90 01/31/2013 0445   GFRAA >90 01/31/2013 0445   COAG Lab Results  Component Value Date   INR 1.12 01/30/2013   INR 0.88 01/25/2013     Disposition:  Discharge to :Home Discharge Orders   Future Appointments Provider Department Dept Phone   02/26/2013 8:45 AM Pryor Ochoa, MD  [facility] [place] [phone]   Future Orders Complete By Expires   ABDOMINAL PROCEDURE/ANEURYSM REPAIR/AORTO-BIFEMORAL BYPASS:  Call MD for increased abdominal pain; cramping diarrhea; nausea/vomiting  As directed    Call MD for:  redness, tenderness, or signs of infection (pain, swelling, bleeding, redness, odor or green/yellow discharge around incision site)  As directed    Call MD for:  severe or increased pain, loss or decreased feeling  in affected limb(s)  As directed    Call MD for:  temperature >100.5  As directed    Driving Restrictions  As directed    Comments:     No driving for 4 weeks   Increase activity slowly  As directed    Comments:     Walk  with assistance use walker or cane as needed   Lifting restrictions  As directed    Comments:     No lifting for 6 weeks   May shower   As directed    may wash over wound with mild soap and water  As directed    No dressing needed  As directed    Resume previous diet  As directed        Medication List         ALPRAZolam 0.5 MG tablet  Commonly known as:  XANAX  Take 0.5 mg by mouth at bedtime as needed for sleep.     atorvastatin 10 MG tablet  Commonly known as:  LIPITOR  Take 1 tablet (10 mg total) by mouth daily at 6 PM.     BIFIDO-GENIC GROWTH FACTORS PO  Take 4 tablets by mouth daily.     PRILOSEC 20 MG capsule  Generic drug:  omeprazole  Take 20 mg by mouth as needed.     valACYclovir 500 MG tablet  Commonly known as:  VALTREX  Take 500 mg by mouth as needed.     Venlafaxine HCl 75 MG Tb24  Take 1 tablet by mouth daily.     vitamin B-12 1000 MCG tablet  Commonly known as:  CYANOCOBALAMIN  Take 1,000 mcg by mouth daily.     vitamin C 1000 MG tablet  Take 1,000 mg by mouth daily.       Verbal and written Discharge instructions given to the patient. Wound care per Discharge AVS     Follow-up Information   Follow up with Josephina Gip, MD In 3 weeks. (office will arrange-sent)    Specialty:  Vascular Surgery   Contact information:   [address] [place] [address] [phone]       Signed: Marlowe Shores 02/04/2013, 12:10 PM  - For VQI Registry use --- Instructions: Press F2 to tab through selections.  Delete question if not applicable.   Post-op:  Time to Extubation: [x ] In OR, [ ]  <12 hrs, [ ]  12-24 hrs, [ ]  > 24 hrs Vasopressors: No  ICU Stay: 1 days  Transfusion: No   New Arrhythmia: No Ipsilateral amputation: [x ] no, [ ]  Minor, [ ]  BKA, [ ]  AKA Discharge patency: [x ] Primary, [ ]  Primary assisted, [ ]  Secondary, [ ]  Occluded Patency judged by: [ ]  Dopper only, [ ]  Palpable graft pulse, [x ] Palpable distal pulse, [ ]  ABI inc. >  0.15, [ ]  Duplex Discharge ABI: R 0.62, L 1.12 D/C Ambulatory Status: Ambulatory  Complications: Wound complication: [x ] No, [ ]  Superficial, [ ]  Return to OR  Graft infection: No  Leg ischemia/emboli: [x ]  No, [ ]  Yes, no Surgery, [ ]  Yes, Surgery req., [ ]  Amputation If amputation: side: [ ]  R: [ ]  minor, [ ]  BKA, [ ]  AKA; [ ]  L: [ ]  Minor, [ ]  BKA, [ ]  AKA  MI: [x ] No, [ ]  Troponin only, [ ]  EKG or Clinical CHF: No Resp failure: [x ] none, [ ]  Pneumonia, [ ]  Ventilator Chg in renal function: [x ] none, [ ]  Inc. Cr > 0.5, [ ]  Temp. Dialysis, [ ]  Permanent dialysis Stroke: [ x] None, [ ]  Minor, [ ]  Major Return to OR: No  Reason for return to OR: [ ]  Bleeding, [ ]  Infection, [ ]  Thrombosis, [ ]  Revision  Discharge medications: Statin use:  Yes ASA use:  Yes Plavix use: No Beta blocker use: NO Coumadin use: No

## 2013-02-04 NOTE — Progress Notes (Signed)
Occupational Therapy Treatment and Discharge Note Patient Details Name: [name] MRN: [id number] DOB: May 31, 1960 Today's Date: 02/04/2013 Time: 8657-8469 OT Time Calculation (min): 26 min  OT Assessment / Plan / Recommendation  History of present illness Pt is s/p aorta bifemoral bypass graft   OT comments  Pt is performing ADL at a supervision to modified independent level.  Education in use of DME, AE, and energy conservation completed.  No further OT needs.    Follow Up Recommendations  No OT follow up    Barriers to Discharge       Equipment Recommendations  3 in 1 bedside comode    Recommendations for Other Services    Frequency Min 2X/week   Progress towards OT Goals Progress towards OT goals: Goals met/education completed, patient discharged from OT  Plan Discharge plan needs to be updated    Precautions / Restrictions Precautions Precautions: Fall Restrictions Weight Bearing Restrictions: No   Pertinent Vitals/Pain VSS, no pain reported.   ADL  Grooming: Wash/dry hands;Teeth care;Brushing hair;Modified independent Where Assessed - Grooming: Unsupported standing Lower Body Bathing: Modified independent Where Assessed - Lower Body Bathing: Unsupported sitting;Supported sit to stand Lower Body Dressing: Modified independent Where Assessed - Lower Body Dressing: Unsupported sitting;Supported sit to stand Toilet Transfer: Modified independent Toilet Transfer Method: Sit to Barista: Raised toilet seat with arms (or 3-in-1 over toilet) Toileting - Clothing Manipulation and Hygiene: Independent Where Assessed - Engineer, mining and Hygiene: Sit on 3-in-1 or toilet Tub/Shower Transfer: Therapist, sports Method: Science writer:  (3 in 1) Equipment Used: Rolling walker Transfers/Ambulation Related to ADLs: mod I with RW ADL Comments: Pt is now able to cross her foot over the  opposite knee to donn pants and socks.  Educated in use of long bath sponge for energy conservation and reacher for accessing low cabinets or dropped objects when home alone.  Instructed to use 3 in1 for energy conservation in shower, but demonstrated ability to stand x 10 minutes during ADL.      OT Diagnosis:    OT Problem List:   OT Treatment Interventions:     OT Goals(current goals can now be found in the care plan section) Acute Rehab OT Goals Patient Stated Goal: to go home. OT Goal Formulation: With patient Time For Goal Achievement: 02/14/13 Potential to Achieve Goals: Good ADL Goals Pt Will Transfer to Toilet: with supervision;ambulating Pt Will Perform Toileting - Clothing Manipulation and hygiene: with modified independence;sit to/from stand Pt Will Perform Tub/Shower Transfer: with supervision;with caregiver independent in assisting;shower seat;ambulating Additional ADL Goal #1: pt/family will verbalize use of compensatory techniques/AE for LB ADL  Visit Information  Last OT Received On: 02/04/13 Assistance Needed: +1 History of Present Illness: Pt is s/p aorta bifemoral bypass graft    Subjective Data      Prior Functioning       Cognition  Cognition Arousal/Alertness: Awake/alert Behavior During Therapy: Anxious Overall Cognitive Status: Within Functional Limits for tasks assessed    Mobility  Bed Mobility Bed Mobility: Not assessed Supine to Sit: 6: Modified independent (Device/Increase time);With rails;HOB elevated Sitting - Scoot to Edge of Bed: 6: Modified independent (Device/Increase time) Details for Bed Mobility Assistance: pt able to perform without assist Transfers Sit to Stand: 6: Modified independent (Device/Increase time);From bed;From toilet Stand to Sit: To toilet;To chair/3-in-1    Exercises      Balance     End of Session OT - End of Session Equipment Utilized  During Treatment: Rolling walker Activity Tolerance: Patient tolerated  treatment well Patient left: in chair;with call bell/phone within reach;with family/visitor present  GO     Evern Bio 02/04/2013, 12:54 PM [phone]

## 2013-02-04 NOTE — Progress Notes (Addendum)
[facility] Progress Note  02/04/2013 7:44 AM POD 5  Subjective:  States she is having pains when passing gas; no BM since having solid food; states she is walking  Afebrile 120's-130's systolic HR 70's-90's regular 78% RA  Filed Vitals:   02/04/13 0409  BP: 138/85  Pulse: 94  Temp: 97.8 F (36.6 C)  Resp: 19    Physical Exam: Cardiac:  RRR Lungs:  CTAB Abdomen:  Soft NT/ND + BS Incisions:  Bilateral groins are c/d/i-right is tender to palpation Extremities:  + palpable DP bilaterally Left > Right  CBC    Component Value Date/Time   WBC 13.9* 01/31/2013 0445   RBC 3.82* 01/31/2013 0445   HGB 11.8* 01/31/2013 0445   HCT 35.4* 01/31/2013 0445   PLT 219 01/31/2013 0445   MCV 92.7 01/31/2013 0445   MCH 30.9 01/31/2013 0445   MCHC 33.3 01/31/2013 0445   RDW 13.0 01/31/2013 0445   LYMPHSABS 1.7 12/18/2006 1226   MONOABS 0.5 12/18/2006 1226   EOSABS 0.1 12/18/2006 1226   BASOSABS 0.1 12/18/2006 1226    BMET    Component Value Date/Time   NA 134* 01/31/2013 0445   K 3.8 01/31/2013 0445   CL 100 01/31/2013 0445   CO2 25 01/31/2013 0445   GLUCOSE 145* 01/31/2013 0445   BUN 6 01/31/2013 0445   CREATININE 0.57 01/31/2013 0445   CALCIUM 8.2* 01/31/2013 0445   GFRNONAA >90 01/31/2013 0445   GFRAA >90 01/31/2013 0445    INR    Component Value Date/Time   INR 1.12 01/30/2013 1240     Intake/Output Summary (Last 24 hours) at 02/04/13 0744 Last data filed at 02/04/13 0600  Gross per 24 hour  Intake   2280 ml  Output    700 ml  Net   1580 ml     Assessment/Plan:  52 y.o. female is s/p Aortobifemoral BPG  POD 5  -pt doing well and ambulating and is no distress -tolerating diet, but no BM since solid food and some pain with flatus -will give a dulcolax this am -continue to mobilize -will start Lovenox -d/c IVF   Doreatha Massed, PA-C [facility] [phone] 02/04/2013 7:44 AM  Agree with above assessment Incisions healing nicely 3+  pulses palpable left DP and PT-right foot well-perfused ABI right leg 0.62 left leg 1.0 We'll continue ambulation today and DC IV fluids in plan DC home in a.m.  Lipitor started on September 20 Once again emphasized need to remain off of cigarettes completely which so far she has done

## 2013-02-04 NOTE — Progress Notes (Signed)
Physical Therapy Treatment Patient Details Name: [name] MRN: [id number] DOB: 1960-09-21 Today's Date: 02/04/2013 Time: 1130-1208 PT Time Calculation (min): 38 min  PT Assessment / Plan / Recommendation  History of Present Illness Pt is s/p aorta bifemoral bypass graft   PT Comments   Pt anxious with mobility especially with increasing mobility including steps. Pt has brick walls surrounding steps at home without rails and educated for 2 techniques and will plan to practice with HHA only next session for pt comfort and reassurance. Pt and family educated for ambulating 3x/day and HHPT at discharge. Will follow.   Follow Up Recommendations  Home health PT;Supervision - Intermittent     Does the patient have the potential to tolerate intense rehabilitation     Barriers to Discharge        Equipment Recommendations  Rolling walker with 5" wheels    Recommendations for Other Services    Frequency     Progress towards PT Goals Progress towards PT goals: Goals met and updated - see care plan  Plan Discharge plan needs to be updated    Precautions / Restrictions Precautions Precautions: Fall Restrictions Weight Bearing Restrictions: No   Pertinent Vitals/Pain No pain pt reports numbness of toes on RLE with ambulation   Mobility  Bed Mobility Supine to Sit: 6: Modified independent (Device/Increase time);With rails;HOB elevated Sitting - Scoot to Edge of Bed: 6: Modified independent (Device/Increase time) Details for Bed Mobility Assistance: pt able to perform without assist Transfers Sit to Stand: 6: Modified independent (Device/Increase time);From bed;From toilet Stand to Sit: To toilet;To chair/3-in-1 Ambulation/Gait Ambulation/Gait Assistance: 5: Supervision Ambulation Distance (Feet): 350 Feet Assistive device: Rolling walker Ambulation/Gait Assistance Details: slow and steady without need for cues but pt denied attempting ambulation without RW out of fear Gait  Pattern: Decreased stride length Gait velocity: decreased Stairs: Yes Stairs Assistance: 4: Min assist Stairs Assistance Details (indicate cue type and reason): Pt performed 11 steps backward with RW with min assist with cueing for sequence and safety. Pt not needing to utilize off weighting legs with RW and practices 2 steps with hand held assist only minguard and descended all steps forward with HHA Stair Management Technique: Backwards;With walker;Forwards;Other (comment) (HHA) Number of Stairs: 13    Exercises     PT Diagnosis:    PT Problem List:   PT Treatment Interventions:     PT Goals (current goals can now be found in the care plan section)    Visit Information  Last PT Received On: 02/04/13 Assistance Needed: +1 History of Present Illness: Pt is s/p aorta bifemoral bypass graft    Subjective Data      Cognition  Cognition Arousal/Alertness: Awake/alert Behavior During Therapy: WFL for tasks assessed/performed Overall Cognitive Status: Within Functional Limits for tasks assessed    Balance     End of Session PT - End of Session Equipment Utilized During Treatment: Gait belt Activity Tolerance: Patient tolerated treatment well Patient left: Other (comment);with family/visitor present;with call bell/phone within reach (in bathroom) Nurse Communication: Mobility status   GP     Toney Sang Beth 02/04/2013, 12:15 PM Delaney Meigs, PT [phone]

## 2013-02-05 ENCOUNTER — Ambulatory Visit: Payer: Private Health Insurance - Indemnity | Admitting: Vascular Surgery

## 2013-02-05 MED ORDER — OXYCODONE HCL 5 MG PO TABS: 5.0000 mg | ORAL_TABLET | Freq: Four times a day (QID) | ORAL | Status: DC | PRN

## 2013-02-05 MED ORDER — ASPIRIN 81 MG PO CHEW: 81.0000 mg | CHEWABLE_TABLET | Freq: Every day | ORAL | Status: DC

## 2013-02-05 MED ORDER — ATORVASTATIN CALCIUM 10 MG PO TABS: 10.0000 mg | ORAL_TABLET | Freq: Every day | ORAL | Status: DC

## 2013-02-05 NOTE — Progress Notes (Signed)
Pt up ambulating in hallway with husband at this time awaiting home equipment to be delivered to room prior to d/c home; steady gait noted; will cont. To monitor.

## 2013-02-05 NOTE — Progress Notes (Signed)
Pt and husband given d/c instructions at this time; both verbalized understanding via teach back method; IV and tele monitor d/c; pt requesting to shower prior to d/c; also awaiting walker and BSC to be delivered to room; will cont. To monitor.

## 2013-02-05 NOTE — Progress Notes (Addendum)
  VASCULAR SURGERY  PROGRESS NOTE   6 Days Post-Op Aortobifem Bypass  SUBJECTIVE: pt doing well. Had large BM yesterday. Ambulating and taking PO well   PHYSICAL EXAM: BP Readings from Last 3 Encounters:  02/05/13 108/68  02/05/13 108/68  01/25/13 138/89   Temp Readings from Last 3 Encounters:  02/05/13 98.4 F (36.9 C) Oral  02/05/13 98.4 F (36.9 C) Oral  01/25/13 98.2 F (36.8 C) Oral   Pulse Readings from Last 3 Encounters:  02/05/13 74  02/05/13 74  01/25/13 91   SpO2 Readings from Last 3 Encounters:  02/05/13 100%  02/05/13 100%  01/25/13 99%     Intake/Output Summary (Last 24 hours) at 02/05/13 0746 Last data filed at 02/04/13 1700  Gross per 24 hour  Intake    480 ml  Output    351 ml  Net    129 ml   Respiratory: clear to auscultation bilaterally Cardiac: RRR without murmur Abdomen non-tender,non-distended Bilateral groins soft without hematoma Extremities: Incisions clean, dry and intact /PT palpable on the left Feet pink and warm  LABS: Lab Results  Component Value Date   WBC 13.9* 01/31/2013   HGB 11.8* 01/31/2013   HCT 35.4* 01/31/2013   MCV 92.7 01/31/2013   PLT 219 01/31/2013   Lab Results  Component Value Date   CREATININE 0.57 01/31/2013   Lab Results  Component Value Date   INR 1.12 01/30/2013     ASSESSMENT: 6 Days Post-Op aortobifem bypass Hypercholesterolemia - pt tol lipitor so far  PLAN: DC Home F/U with PCP for cholesterol Ambulate  Disposition::Home.  F/u 3-4 weeks    Agree with above Incisions healing nicely with 3+ femoral pulses palpable and left dorsalis and posterior tibial pulses 2+. Right foot well-perfused. DC home today and return to office in 3 weeks for followup

## 2013-02-05 NOTE — Progress Notes (Signed)
Physical Therapy Treatment Patient Details Name: [name] MRN: [id number] DOB: [birth year] Today's Date: 02/05/2013 Time: 1191-4782 PT Time Calculation (min): 26 min  PT Assessment / Plan / Recommendation  History of Present Illness Pt is s/p aorta bifemoral bypass graft   PT Comments   Pt with excellent mobility today and able to ambulate and perform stairs without assist or DME. Pt educated for need to continue ambulating and perform general bil LE HEP. Pt and spouse voiced understanding of education and agreeable to no further needs. Will sign off.   Follow Up Recommendations  No PT follow up     Does the patient have the potential to tolerate intense rehabilitation     Barriers to Discharge        Equipment Recommendations  None recommended by PT    Recommendations for Other Services    Frequency     Progress towards PT Goals Progress towards PT goals: Goals met/education completed, patient discharged from PT  Plan Discharge plan needs to be updated    Precautions / Restrictions Precautions Precautions: None   Pertinent Vitals/Pain No pain, tightness in right calf    Mobility  Bed Mobility Bed Mobility: Not assessed Transfers Sit to Stand: 6: Modified independent (Device/Increase time);From bed Stand to Sit: 6: Modified independent (Device/Increase time);To bed Ambulation/Gait Ambulation/Gait Assistance: 6: Modified independent (Device/Increase time) Ambulation Distance (Feet): 350 Feet Assistive device: None Gait Pattern: Step-through pattern;Decreased stride length Gait velocity: decreased Stairs: Yes Stairs Assistance: 4: Min guard Stair Management Technique: No rails;Forwards;Step to pattern Number of Stairs: 11    Exercises     PT Diagnosis:    PT Problem List:   PT Treatment Interventions:     PT Goals (current goals can now be found in the care plan section)    Visit Information  Last PT Received On: 02/05/13 Assistance Needed: +1 History of  Present Illness: Pt is s/p aorta bifemoral bypass graft    Subjective Data      Cognition  Cognition Arousal/Alertness: Awake/alert Behavior During Therapy: WFL for tasks assessed/performed Overall Cognitive Status: Within Functional Limits for tasks assessed    Balance     End of Session PT - End of Session Activity Tolerance: Patient tolerated treatment well Patient left: in chair;with call bell/phone within reach;with family/visitor present Nurse Communication: Mobility status   GP     Delorse Lek 02/05/2013, 10:09 AM Delaney Meigs, PT [phone]

## 2013-02-12 ENCOUNTER — Telehealth: Payer: Self-pay | Admitting: *Deleted

## 2013-02-12 NOTE — Telephone Encounter (Signed)
[name] called because had a great deal of pain below her belly button last night. She feels like she has maybe a UTI or just is constipated. She gave her self a suppository this morning, she has been passing a lot of gas and feels better. I told her to drink lots of fluid and walk around more. I explained that anethesia and pain medicine slow down bowel activity. She said she has a PCP appt this week and I told her to mention this to him. I,also, told her to call us if pain comes back or increases. She verbalized understanding.

## 2013-02-22 ENCOUNTER — Telehealth: Payer: Self-pay | Admitting: *Deleted

## 2013-02-22 DIAGNOSIS — G8918 Other acute postprocedural pain: Secondary | ICD-10-CM

## 2013-02-22 MED ORDER — TRAMADOL HCL 50 MG PO TABS: 50.0000 mg | ORAL_TABLET | Freq: Four times a day (QID) | ORAL | Status: DC | PRN

## 2013-02-22 NOTE — Telephone Encounter (Signed)
Patient called to request additional pain medication because she has been more ambulatory since discharge (s/p aortobifemoral BPG on 01-30-13 by Dr. Hart Rochester). She states that she has been afebrile, has no erythema or drainage. She has incisional and musculoskeletal pain when moving around the house. I asked Dr. Imogene Burn and he authorized this Rx for Tramadol to be called in to the [facility]. Patient voiced understanding of the use of this medication and she will keep her postop appt with Dr. Hart Rochester on Tuesday 02-26-13.

## 2013-02-25 ENCOUNTER — Encounter: Payer: Self-pay | Admitting: Vascular Surgery

## 2013-02-26 ENCOUNTER — Other Ambulatory Visit: Payer: Self-pay

## 2013-02-26 ENCOUNTER — Ambulatory Visit (INDEPENDENT_AMBULATORY_CARE_PROVIDER_SITE_OTHER): Payer: Private Health Insurance - Indemnity | Admitting: Vascular Surgery

## 2013-02-26 ENCOUNTER — Encounter: Payer: Self-pay | Admitting: Vascular Surgery

## 2013-02-26 VITALS — BP 130/89 | HR 78 | Ht 67.0 in | Wt 153.6 lb

## 2013-02-26 DIAGNOSIS — I70219 Atherosclerosis of native arteries of extremities with intermittent claudication, unspecified extremity: Secondary | ICD-10-CM

## 2013-02-26 DIAGNOSIS — I7772 Dissection of iliac artery: Secondary | ICD-10-CM

## 2013-02-26 NOTE — Progress Notes (Signed)
Subjective:     Patient ID: [name], female   DOB: 1961-04-08, 52 y.o.   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 She has done very well with complete resolution of her claudication symptoms. She does continue to have some numbness in the right second third and fourth toes due to the ischemia she was experiencing preoperatively. She has no limitations with her walking however. Her appetite is improving as is her general strength. She denies any chest pain or shortness of breath. She has continued to refrain from any cigarette smoking since her preoperative visit.  Review of Systems     Objective:   Physical Exam BP 130/89  Pulse 78  Ht 5\' 7"  (1.702 m)  Wt 153 lb 9.6 oz (69.673 kg)  BMI 24.05 kg/m2  SpO2 100%  LMP 10/31/2010  General well-developed well-nourished female no apparent stress alert and oriented x3 Lungs no rhonchi or wheezing Abdomen incision well-healed no evidence of ventral hernia. Lower extremities have 3+ femoral and 2+ posterior tibial pulses palpable bilaterally.     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

## 2013-02-26 NOTE — Addendum Note (Signed)
Addended by: Sharee Pimple on: 02/26/2013 11:36 AM   Modules accepted: Orders

## 2013-02-28 ENCOUNTER — Telehealth: Payer: Self-pay

## 2013-02-28 ENCOUNTER — Encounter: Payer: Self-pay | Admitting: Vascular Surgery

## 2013-02-28 ENCOUNTER — Ambulatory Visit (INDEPENDENT_AMBULATORY_CARE_PROVIDER_SITE_OTHER): Payer: Private Health Insurance - Indemnity | Admitting: Vascular Surgery

## 2013-02-28 VITALS — BP 124/87 | HR 96 | Resp 16 | Ht 67.0 in | Wt 154.0 lb

## 2013-02-28 DIAGNOSIS — I70219 Atherosclerosis of native arteries of extremities with intermittent claudication, unspecified extremity: Secondary | ICD-10-CM

## 2013-02-28 DIAGNOSIS — G8918 Other acute postprocedural pain: Secondary | ICD-10-CM | POA: Insufficient documentation

## 2013-02-28 NOTE — Telephone Encounter (Signed)
Phone call from pt.  Stated she developed a soreness on the top of her abdominal incision on 10/14 afternoon, following her appt. with Dr. Hart Rochester.  Describes the area at the top of incision as a bump the size of an eraser head with a whitehead on it.  Also stated she now feels a bump starting at the base of the incision.  Denied fever/ chills.  Denied drainage.  Stated the incision is intact.  Discussed with Dr. Darrick Penna.  Recommended pt. Come to office today for evaluation.   Appt. given for 12:45 PM today.  Agrees with plan.

## 2013-02-28 NOTE — Progress Notes (Signed)
Patient is a 52 year old female who recently underwent aortobifemoral bypass by Dr. Hart Rochester. She noticed a nodule forming at the upper portion of her incision which was tender to touch. She denies any fever or chills. She denies any drainage. She is walking approximately 1 mile per day.  Physical exam:  Filed Vitals:   02/28/13 1312  BP: 124/87  Pulse: 96  Resp: 16  Height: 5\' 7"  (1.702 m)  Weight: 154 lb (69.854 kg)    Abdomen: Soft appropriately tender 2 cm nodule 4 cm from the top of the incision with erythema this was opened with drainage of some clear to whitish fluid this did not penetrate deep  Extremities: Bilateral groin incisions healing well no erythema  Assessment: Stitch abscess proximal portion of midline laparotomy incision Plan: Wash once daily with soap and water cover with dry dressing. The patient has followup Dr. Hart Rochester in December. I advised her that if the open wound has not completely healed within 2 weeks to call is for a followup appointment.  Fabienne Bruns, MD [facility] Office: [phone] Pager: [phone]

## 2013-03-21 ENCOUNTER — Other Ambulatory Visit: Payer: Self-pay

## 2013-03-28 ENCOUNTER — Encounter: Payer: Self-pay | Admitting: *Deleted

## 2013-04-04 ENCOUNTER — Telehealth: Payer: Self-pay

## 2013-04-04 NOTE — Telephone Encounter (Signed)
Phone call from pt. to report abdominal pain after episode of falling last Friday, when her dog knocked her down.  Stated she felt a "sharp pain" in the lower incision, below umbilicus, along left border of incision at time of fall.   Reports that she was riding in a car on Sunday, and upon going over a couple of pot holes in the road, had "violent pain" in the abdomen and grabbed her stomach.  Since the two different episodes, reports when she turns on her left side, "feels like something is tearing."  Also states when she coughs or bears down to have a BM, it hurts and pulls, so she supports herself with a pillow.   States has some purple bruising in the mid incisional area above and below the umbilicus. Also, notes there is some swelling on the left side of the incision, compared to the right side. Questioned about fever; states "I haven't been able to get warm for 3 days."  Discussed w/ Dr. Darrick Penna, in the office.  Recommends pt. to follow up to see Dr. Hart Rochester next week.  Appt. given for 04/09/13 @  8:30 AM.  Advised pt. to go to the ER if her symptoms of pain worsen.  Verb. Understanding.

## 2013-04-08 ENCOUNTER — Encounter: Payer: Self-pay | Admitting: Vascular Surgery

## 2013-04-09 ENCOUNTER — Ambulatory Visit: Payer: Private Health Insurance - Indemnity | Admitting: Vascular Surgery

## 2013-04-29 ENCOUNTER — Encounter: Payer: Self-pay | Admitting: Vascular Surgery

## 2013-04-30 ENCOUNTER — Ambulatory Visit (HOSPITAL_COMMUNITY)
Admission: RE | Admit: 2013-04-30 | Discharge: 2013-04-30 | Disposition: A | Discharge status: Home / Self Care | Payer: Private Health Insurance - Indemnity | Source: Ambulatory Visit | Attending: Vascular Surgery | Admitting: Vascular Surgery

## 2013-04-30 ENCOUNTER — Encounter: Payer: Self-pay | Admitting: *Deleted

## 2013-04-30 ENCOUNTER — Encounter: Payer: Self-pay | Admitting: Vascular Surgery

## 2013-04-30 ENCOUNTER — Ambulatory Visit (INDEPENDENT_AMBULATORY_CARE_PROVIDER_SITE_OTHER): Payer: Private Health Insurance - Indemnity | Admitting: Vascular Surgery

## 2013-04-30 VITALS — BP 144/100 | HR 82 | Resp 16 | Ht 67.0 in | Wt 155.0 lb

## 2013-04-30 DIAGNOSIS — I70219 Atherosclerosis of native arteries of extremities with intermittent claudication, unspecified extremity: Secondary | ICD-10-CM

## 2013-04-30 DIAGNOSIS — I739 Peripheral vascular disease, unspecified: Secondary | ICD-10-CM

## 2013-04-30 NOTE — Progress Notes (Signed)
Subjective:     Patient ID: [name], female   DOB: 10/05/1960, 52 y.o.   MRN: [id number]  HPI this 51 year old female returns 3 months post aortobifemoral bypass grafting for severe aortoiliac occlusive disease. She also has a known right popliteal occlusion-segmental. She is able to ambulate about 1 mile now before developing right calf claudication. She has no rest pain or nonhealing ulcers history and does have some occasional numbness and tingling in the right second and third toes.     Review of Systems     Objective:   Physical Exam BP 144/100  Pulse 82  Resp 16  Ht 5\' 7"  (1.702 m)  Wt 155 lb (70.308 kg)  BMI 24.27 kg/m2  LMP 10/31/2010  General well-developed well-nourished female in no apparent stress alert and oriented x3 Abdomen soft nontender. Midline incision well-healed no evidence of ventral hernia 3+ femoral popliteal and dorsalis pedis pulse on the left. 3+ femoral absent popliteal and distal pulses on the right. Both the well-perfused.  ABIs today are 1.08 on the left and 0.71 on the right with monophasic flow on the right and biphasic flow on the left.     Assessment:     Status post aortobifemoral bypass grafting with complete resolution of claudication the left but residual right calf claudication at 1 mile and very active female-none segmental occlusion of right popliteal artery which could be treated if indicated in the future    Plan:     Patient will work on her ambulation over the next 6 months will repeat ABIs on return and she will then have a better feeling about whether right superficial femoral to popliteal bypass graft should be performed to relieve her symptoms

## 2013-05-01 ENCOUNTER — Encounter: Payer: Self-pay | Admitting: *Deleted

## 2013-05-01 NOTE — Addendum Note (Signed)
Addended by: Sharee Pimple on: 05/01/2013 10:22 AM   Modules accepted: Orders

## 2013-07-11 ENCOUNTER — Other Ambulatory Visit: Payer: Self-pay

## 2013-07-11 DIAGNOSIS — Z1231 Encounter for screening mammogram for malignant neoplasm of breast: Secondary | ICD-10-CM

## 2013-07-23 ENCOUNTER — Telehealth: Payer: Self-pay

## 2013-07-23 DIAGNOSIS — R1909 Other intra-abdominal and pelvic swelling, mass and lump: Secondary | ICD-10-CM

## 2013-07-23 NOTE — Telephone Encounter (Signed)
Phone call from pt.  Reported right groin incision has a "puffy" appearance from the incision and 2-3 inches into the right upper, inner thigh.  C/o "soreness" in this site.  Denied redness, drainage, or fever/ chills.  Stated the right groin feels slightly warmer than the left groin.  Also stated when she sits down, there is a sensation of fullness in the upper, inner thigh area. Reported she 1st noticed these symptoms last Fri., 07/19/13.  Also reported that the incision itself is more pigmented red compared to the left incision.  Discussed with Dr. Kellie Simmering. Recommended to have pt.  Continue to monitor symptoms and if develops any increased redness, drainage, fever, or chills, or if area increases in size, then schedule for duplex of graft and appt. next week.  Returned call to pt.  Advised of the above recommendations per Dr. Kellie Simmering.  Verb. understanding.   Will schedule appts. For vascular lab and MD visit on 07/30/13; pt. to call and cancel, if symptoms resolve.

## 2013-07-26 ENCOUNTER — Other Ambulatory Visit: Payer: Self-pay

## 2013-07-26 DIAGNOSIS — K769 Liver disease, unspecified: Secondary | ICD-10-CM

## 2013-07-29 ENCOUNTER — Ambulatory Visit
Admission: RE | Admit: 2013-07-29 | Discharge: 2013-07-29 | Disposition: A | Discharge status: Home / Self Care | Payer: BC Managed Care – PPO | Source: Ambulatory Visit | Attending: Vascular Surgery | Admitting: Vascular Surgery

## 2013-07-29 ENCOUNTER — Encounter: Payer: Self-pay | Admitting: Vascular Surgery

## 2013-07-29 ENCOUNTER — Ambulatory Visit: Payer: Private Health Insurance - Indemnity | Admitting: Surgery

## 2013-07-29 DIAGNOSIS — K769 Liver disease, unspecified: Secondary | ICD-10-CM

## 2013-07-29 MED ORDER — IOHEXOL 300 MG/ML  SOLN
100.0000 mL | Freq: Once | INTRAMUSCULAR | Status: AC | PRN
  Administered 2013-07-29: 100 mL via INTRAVENOUS

## 2013-07-30 ENCOUNTER — Ambulatory Visit (HOSPITAL_COMMUNITY)
Admission: RE | Admit: 2013-07-30 | Discharge: 2013-07-30 | Disposition: A | Discharge status: Home / Self Care | Payer: BC Managed Care – PPO | Source: Ambulatory Visit | Attending: Vascular Surgery | Admitting: Vascular Surgery

## 2013-07-30 ENCOUNTER — Ambulatory Visit (INDEPENDENT_AMBULATORY_CARE_PROVIDER_SITE_OTHER): Payer: BC Managed Care – PPO | Admitting: Vascular Surgery

## 2013-07-30 ENCOUNTER — Encounter: Payer: Self-pay | Admitting: Vascular Surgery

## 2013-07-30 ENCOUNTER — Ambulatory Visit: Payer: Private Health Insurance - Indemnity | Admitting: Vascular Surgery

## 2013-07-30 VITALS — BP 131/97 | HR 69 | Ht 67.0 in | Wt 156.3 lb

## 2013-07-30 DIAGNOSIS — I739 Peripheral vascular disease, unspecified: Secondary | ICD-10-CM

## 2013-07-30 DIAGNOSIS — R1909 Other intra-abdominal and pelvic swelling, mass and lump: Secondary | ICD-10-CM

## 2013-07-30 DIAGNOSIS — R1900 Intra-abdominal and pelvic swelling, mass and lump, unspecified site: Secondary | ICD-10-CM | POA: Insufficient documentation

## 2013-07-30 NOTE — Progress Notes (Signed)
Subjective:     Patient ID: [name], female   DOB: 1961-03-09, 53 y.o.   MRN: [id number]  HPI this 53 year old female returns for continued followup regarding her aortobifemoral bypass graft which was done 6 months ago. Recently she developed some fullness and discomfort in the right inguinal region and she was concerned about this. The discomfort has resolved. She is able to walk about three quarters of a mile before she develops claudication in the right calf. She has a known right popliteal segmental occlusion. She has no history of rest pain or nonhealing ulcers. She did have a small questionable lesion in her liver on the preoperative CT scan and for that reason a CT scan was repeated yesterday to followup on this. Otherwise she is doing quite well. She continues to completely refrain from smoking since her surgery  Past Medical History  Diagnosis Date  . Hyperlipidemia     not on meds at present and Dr.McKenzie will review after surgery  . Genital herpes   . Vitamin B12 deficiency   . Peripheral vascular disease   . GERD (gastroesophageal reflux disease)     takes Omeprazole daily  . Anxiety     takes Xanax prn   . Depression     takes Effexor daily  . History of bronchitis     last time 73months ago  . History of migraine     last one 55yrs ago  . Weakness     bil legs  . Back pain     DDD  . Hemorrhoids   . Constipation   . History of colon polyps   . Insomnia     History  Substance Use Topics  . Smoking status: Former Smoker -- 0.50 packs/day for 35 years    Types: Cigarettes    Quit date: 01/30/2013  . Smokeless tobacco: Never Used  . Alcohol Use: 0.0 oz/week     Comment: weekends only    Family History  Problem Relation Age of Onset  . Hyperlipidemia Mother     Allergies  Allergen Reactions  . Chantix [Varenicline]     Unknown    . Septra [Sulfamethoxazole-Tmp Ds] Other (See Comments)    Unknown   . Simvastatin     Muscle cramps     Current  outpatient prescriptions:ALPRAZolam (XANAX) 0.5 MG tablet, Take 0.5 mg by mouth at bedtime as needed for sleep., Disp: , Rfl: ;  aspirin 81 MG chewable tablet, Chew 1 tablet (81 mg total) by mouth daily., Disp: , Rfl: ;  fluvastatin (LESCOL) 20 MG capsule, Take 20 mg by mouth at bedtime., Disp: , Rfl: ;  Nutritional Supplements (BIFIDO-GENIC GROWTH FACTORS PO), Take 4 tablets by mouth daily., Disp: , Rfl:  traMADol (ULTRAM) 50 MG tablet, Take 1 tablet (50 mg total) by mouth every 6 (six) hours as needed for pain., Disp: 30 tablet, Rfl: 0;  valACYclovir (VALTREX) 500 MG tablet, Take 500 mg by mouth as needed., Disp: , Rfl: ;  Venlafaxine HCl 75 MG TB24, Take 1 tablet by mouth daily., Disp: , Rfl: ;  Ascorbic Acid (VITAMIN C) 1000 MG tablet, Take 1,000 mg by mouth daily., Disp: , Rfl:  atorvastatin (LIPITOR) 10 MG tablet, Take 1 tablet (10 mg total) by mouth daily at 6 PM., Disp: 60 tablet, Rfl: 10;  omeprazole (PRILOSEC) 20 MG capsule, Take 20 mg by mouth as needed. , Disp: , Rfl: ;  oxyCODONE (OXY IR/ROXICODONE) 5 MG immediate release tablet, Take 1-2 tablets (5-10 mg  total) by mouth every 6 (six) hours as needed., Disp: 30 tablet, Rfl: 0 vitamin B-12 (CYANOCOBALAMIN) 1000 MCG tablet, Take 1,000 mcg by mouth daily., Disp: , Rfl:   BP 131/97  Pulse 69  Ht 5\' 7"  (1.702 m)  Wt 156 lb 4.8 oz (70.897 kg)  BMI 24.47 kg/m2  SpO2 100%  LMP 10/31/2010  Body mass index is 24.47 kg/(m^2).           Review of Systems denies chest pain, dyspnea on exertion, PND, orthopnea, hemoptysis, all systems negative except for claudication right leg     Objective:   Physical Exam BP 131/97  Pulse 69  Ht 5\' 7"  (1.702 m)  Wt 156 lb 4.8 oz (70.897 kg)  BMI 24.47 kg/m2  SpO2 100%  LMP 10/31/2010  Gen.-alert and oriented x3 in no apparent distress HEENT normal for age Lungs no rhonchi or wheezing Cardiovascular regular rhythm no murmurs carotid pulses 3+ palpable no bruits audible Abdomen soft nontender  no palpable masses Musculoskeletal free of  major deformities Skin clear -no rashes Neurologic normal Lower extremities 3+ femoral and dorsalis pedis pulse left leg 3+ femoral and absent popliteal and distal pulses right leg. Both feet well perfused.   Patient had a CT scan performed yesterday which have reviewed the computer. The lesion in the liver was not visualized on this examination. Today I ordered a duplex scan of the right inguinal area which I reviewed and interpreted. There is no evidence of pseudoaneurysm or other abnormalities where the surgery was performed      Assessment:     Doing well 6 months post aortobifemoral bypass grafting for severe aortoiliac occlusive disease Patient has refrain from using tobacco since surgery Preoperative hepatic lesion which was small is not visualized on CT scan at this time    Plan:     Patient will return in 6 months with ABIs for further followup regarding her right leg claudication. Currently her symptoms are not bad enough to warrant any further treatment. She will notify her medical doctor regarding the small hepatic lesion which is now not visualize so that can be followed up if needed by her medical doctor

## 2013-07-31 ENCOUNTER — Ambulatory Visit
Admission: RE | Admit: 2013-07-31 | Discharge: 2013-07-31 | Disposition: A | Discharge status: Home / Self Care | Payer: BC Managed Care – PPO | Source: Ambulatory Visit

## 2013-07-31 DIAGNOSIS — Z1231 Encounter for screening mammogram for malignant neoplasm of breast: Secondary | ICD-10-CM

## 2013-07-31 NOTE — Addendum Note (Signed)
Addended by: Mena Goes on: 07/31/2013 10:42 AM   Modules accepted: Orders

## 2013-08-05 ENCOUNTER — Other Ambulatory Visit: Payer: Self-pay | Admitting: Internal Medicine

## 2013-08-05 DIAGNOSIS — R928 Other abnormal and inconclusive findings on diagnostic imaging of breast: Secondary | ICD-10-CM

## 2013-08-15 ENCOUNTER — Other Ambulatory Visit: Payer: BC Managed Care – PPO

## 2013-08-21 ENCOUNTER — Ambulatory Visit
Admission: RE | Admit: 2013-08-21 | Discharge: 2013-08-21 | Disposition: A | Discharge status: Home / Self Care | Payer: BC Managed Care – PPO | Source: Ambulatory Visit | Attending: Internal Medicine | Admitting: Internal Medicine

## 2013-08-21 ENCOUNTER — Ambulatory Visit
Admission: RE | Admit: 2013-08-21 | Discharge: 2013-08-21 | Disposition: A | Discharge status: Home / Self Care | Payer: Self-pay | Source: Ambulatory Visit | Attending: Internal Medicine | Admitting: Internal Medicine

## 2013-08-21 DIAGNOSIS — R928 Other abnormal and inconclusive findings on diagnostic imaging of breast: Secondary | ICD-10-CM

## 2013-10-29 ENCOUNTER — Encounter (HOSPITAL_COMMUNITY): Payer: Private Health Insurance - Indemnity

## 2013-10-29 ENCOUNTER — Ambulatory Visit: Payer: Private Health Insurance - Indemnity | Admitting: Vascular Surgery

## 2014-02-03 ENCOUNTER — Encounter: Payer: Self-pay | Admitting: Vascular Surgery

## 2014-02-04 ENCOUNTER — Ambulatory Visit (HOSPITAL_COMMUNITY)
Admission: RE | Admit: 2014-02-04 | Discharge: 2014-02-04 | Disposition: A | Discharge status: Home / Self Care | Payer: BC Managed Care – PPO | Source: Ambulatory Visit | Attending: Vascular Surgery | Admitting: Vascular Surgery

## 2014-02-04 ENCOUNTER — Other Ambulatory Visit: Payer: Self-pay | Admitting: Vascular Surgery

## 2014-02-04 ENCOUNTER — Encounter: Payer: Self-pay | Admitting: Vascular Surgery

## 2014-02-04 ENCOUNTER — Ambulatory Visit (INDEPENDENT_AMBULATORY_CARE_PROVIDER_SITE_OTHER): Payer: BC Managed Care – PPO | Admitting: Vascular Surgery

## 2014-02-04 VITALS — BP 128/80 | HR 68 | Resp 16 | Ht 67.0 in | Wt 143.0 lb

## 2014-02-04 DIAGNOSIS — Z48812 Encounter for surgical aftercare following surgery on the circulatory system: Secondary | ICD-10-CM

## 2014-02-04 DIAGNOSIS — I739 Peripheral vascular disease, unspecified: Secondary | ICD-10-CM

## 2014-02-04 DIAGNOSIS — I70219 Atherosclerosis of native arteries of extremities with intermittent claudication, unspecified extremity: Secondary | ICD-10-CM

## 2014-02-04 NOTE — Addendum Note (Signed)
Addended by: Mena Goes on: 02/04/2014 01:01 PM   Modules accepted: Orders

## 2014-02-04 NOTE — Progress Notes (Signed)
Subjective:     Patient ID: [name], female   DOB: Oct 06, 1960, 53 y.o.   MRN: [id number]  HPI this 53 year old female returns for continued followup regarding her aortobifemoral bypass graft which performed 01/30/2013. She has had complete resolution of her claudication symptoms. She will occasionally have numbness in the right second and third toes after exercising for a long period of time. She has no limiting claudication whatsoever in the right calf. She continues to refrain from smoking since her surgical procedure and does have a 30 pack year history of smoking. She denies any other specific vascular symptoms. She takes one aspirin per day. 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 Hyperlipidemia     not on meds at present and Dr.McKenzie will review after surgery  . Genital herpes   . Vitamin B12 deficiency   . Peripheral vascular disease   . GERD (gastroesophageal reflux disease)     takes Omeprazole daily  . Anxiety     takes Xanax prn   . Depression     takes Effexor daily  . History of bronchitis     last time 92months ago  . History of migraine     last one 48yrs ago  . Weakness     bil legs  . Back pain     DDD  . Hemorrhoids   . Constipation   . History of colon polyps   . Insomnia     History  Substance Use Topics  . Smoking status: Former Smoker -- 0.50 packs/day for 35 years    Types: Cigarettes    Quit date: 01/30/2013  . Smokeless tobacco: Never Used  . Alcohol Use: 0.0 oz/week     Comment: weekends only    Family History  Problem Relation Age of Onset  . Hyperlipidemia Mother     Allergies  Allergen Reactions  . Chantix [Varenicline]     Unknown    . Septra [Sulfamethoxazole-Tmp Ds] Other (See Comments)    Unknown   . Simvastatin     Muscle cramps     Current outpatient prescriptions:ALPRAZolam (XANAX) 0.5 MG tablet, Take 0.5 mg by mouth at bedtime as needed for sleep.,  Disp: , Rfl: ;  aspirin 81 MG chewable tablet, Chew 1 tablet (81 mg total) by mouth daily., Disp: , Rfl: ;  fluvastatin (LESCOL) 20 MG capsule, Take 20 mg by mouth at bedtime., Disp: , Rfl: ;  Nutritional Supplements (BIFIDO-GENIC GROWTH FACTORS PO), Take 4 tablets by mouth daily., Disp: , Rfl:  omeprazole (PRILOSEC) 20 MG capsule, Take 20 mg by mouth as needed. , Disp: , Rfl: ;  valACYclovir (VALTREX) 500 MG tablet, Take 500 mg by mouth as needed., Disp: , Rfl: ;  Venlafaxine HCl 75 MG TB24, Take 1 tablet by mouth daily., Disp: , Rfl: ;  Ascorbic Acid (VITAMIN C) 1000 MG tablet, Take 1,000 mg by mouth daily., Disp: , Rfl: ;  atorvastatin (LIPITOR) 10 MG tablet, Take 1 tablet (10 mg total) by mouth daily at 6 PM., Disp: 60 tablet, Rfl: 10 oxyCODONE (OXY IR/ROXICODONE) 5 MG immediate release tablet, Take 1-2 tablets (5-10 mg total) by mouth every 6 (six) hours as needed., Disp: 30 tablet, Rfl: 0;  traMADol (ULTRAM) 50 MG tablet, Take 1 tablet (50 mg total) by mouth every 6 (six) hours as needed for pain., Disp: 30 tablet, Rfl: 0;  vitamin B-12 (CYANOCOBALAMIN) 1000 MCG tablet, Take 1,000 mcg by  mouth daily., Disp: , Rfl:   BP 128/80  Pulse 68  Resp 16  Ht 5\' 7"  (1.702 m)  Wt 143 lb (64.864 kg)  BMI 22.39 kg/m2  LMP 10/31/2010  Body mass index is 22.39 kg/(m^2).           Review of Systems denies chest pain, dyspnea on exertion, PND, orthopnea. Only symptoms are occasional numb sensations in the right second and third toes following exercise. All other systems negative.    Objective:   Physical Exam BP 128/80  Pulse 68  Resp 16  Ht 5\' 7"  (1.702 m)  Wt 143 lb (64.864 kg)  BMI 22.39 kg/m2  LMP 10/31/2010  Gen.-alert and oriented x3 in no apparent distress HEENT normal for age Lungs no rhonchi or wheezing Cardiovascular regular rhythm no murmurs carotid pulses 3+ palpable no bruits audible Abdomen soft nontender no palpable masses Musculoskeletal free of  major deformities Skin  clear -no rashes Neurologic normal Lower extremities 3+ femoral and dorsalis pedis on the left. 3+ femoral on the right with no palpable pedal pulses. Both feet have slight bluish discoloration but are adequately perfused with good sensation and motion.  Today I ordered Lorsch family arterial study which are reviewed and interpreted. The right leg has biphasic flow with ABI of 0.79 left leg triphasic flow with ABI of 0.99       Assessment:     1 year post aortobifemoral bypass grafting with known right popliteal occlusion. Currently asymptomatic with no claudication symptoms Successful tobacco cessation now at one year    Plan:     Return in one year with repeat Lorsch family arterial study unless claudication symptoms and right leg occur. She is tolerating popliteal occlusion well and remaining quite active in doubt that she will need any revascularization unless something changes

## 2014-04-25 ENCOUNTER — Encounter: Payer: Self-pay | Admitting: Internal Medicine

## 2014-09-25 ENCOUNTER — Other Ambulatory Visit: Payer: Self-pay

## 2014-09-25 DIAGNOSIS — Z1231 Encounter for screening mammogram for malignant neoplasm of breast: Secondary | ICD-10-CM

## 2014-09-30 ENCOUNTER — Ambulatory Visit: Payer: Self-pay

## 2014-10-01 ENCOUNTER — Ambulatory Visit
Admission: RE | Admit: 2014-10-01 | Discharge: 2014-10-01 | Disposition: A | Discharge status: Home / Self Care | Payer: Private Health Insurance - Indemnity | Source: Ambulatory Visit

## 2014-10-01 ENCOUNTER — Encounter: Payer: Self-pay | Admitting: Cardiology

## 2014-10-01 DIAGNOSIS — Z1231 Encounter for screening mammogram for malignant neoplasm of breast: Secondary | ICD-10-CM

## 2015-02-10 ENCOUNTER — Encounter (HOSPITAL_COMMUNITY): Payer: Self-pay

## 2015-02-10 ENCOUNTER — Ambulatory Visit: Payer: BC Managed Care – PPO | Admitting: Vascular Surgery

## 2017-11-11 HISTORY — PX: COLONOSCOPY: SHX174

## 2017-11-20 ENCOUNTER — Other Ambulatory Visit: Payer: Self-pay | Admitting: Family Medicine

## 2017-11-20 DIAGNOSIS — Z1231 Encounter for screening mammogram for malignant neoplasm of breast: Secondary | ICD-10-CM

## 2017-12-12 ENCOUNTER — Ambulatory Visit
Admission: RE | Admit: 2017-12-12 | Discharge: 2017-12-12 | Disposition: A | Discharge status: Home / Self Care | Payer: BLUE CROSS/BLUE SHIELD | Source: Ambulatory Visit | Attending: Family Medicine | Admitting: Family Medicine

## 2017-12-12 DIAGNOSIS — Z1231 Encounter for screening mammogram for malignant neoplasm of breast: Secondary | ICD-10-CM

## 2019-02-04 ENCOUNTER — Other Ambulatory Visit: Payer: Self-pay | Admitting: Family Medicine

## 2019-02-04 ENCOUNTER — Ambulatory Visit
Admission: RE | Admit: 2019-02-04 | Discharge: 2019-02-04 | Disposition: A | Discharge status: Home / Self Care | Payer: BC Managed Care – PPO | Source: Ambulatory Visit | Attending: Family Medicine | Admitting: Family Medicine

## 2019-02-04 ENCOUNTER — Other Ambulatory Visit: Payer: Self-pay

## 2019-02-04 DIAGNOSIS — Z1231 Encounter for screening mammogram for malignant neoplasm of breast: Secondary | ICD-10-CM

## 2019-03-22 ENCOUNTER — Other Ambulatory Visit: Payer: Self-pay | Admitting: Surgery

## 2019-05-03 ENCOUNTER — Other Ambulatory Visit: Payer: Self-pay

## 2019-05-03 ENCOUNTER — Encounter: Payer: Self-pay | Admitting: Plastic Surgery

## 2019-05-03 ENCOUNTER — Ambulatory Visit (INDEPENDENT_AMBULATORY_CARE_PROVIDER_SITE_OTHER): Payer: Self-pay | Admitting: Plastic Surgery

## 2019-05-03 DIAGNOSIS — N62 Hypertrophy of breast: Secondary | ICD-10-CM | POA: Insufficient documentation

## 2019-05-03 NOTE — Progress Notes (Signed)
Patient ID: [name], female    DOB: [birth year], 58 y.o.   MRN: [id number]   Chief Complaint  Patient presents with  . Breast Problem    Mammary Hyperplasia: The patient is a 58 y.o. female with a history of mammary hyperplasia for several years.  She has extremely large breasts causing symptoms that include the following: Back pain in the upper and lower back, including neck pain. She pulls or pins her bra straps to provide better lift and relief of the pressure and pain. She notices relief by holding her breast up manually.  Her shoulder straps cause grooves and pain and pressure that requires padding for relief. Pain medication is sometimes required with motrin and tylenol.  Activities that are hindered by enlarged breasts include: exercise and running.   Her breasts are extremely large and fairly symmetric.  She has hyperpigmentation of the inframammary area on both sides.  The sternal to nipple distance on the right is 30 cm and the left is 30 cm.  The IMF distance is 17 cm.  She is 5 feet 7 inches tall and weighs 158 pounds.  Preoperative bra size = 38 DDD cup. She would like to be a C cup  The estimated excess breast tissue to be removed at the time of surgery = 250 grams on the left and 250 grams on the right.  Mammogram history: Sep 2020 IMPRESSION: No mammographic evidence of malignancy.  She smokes cigars.    Review of Systems  Constitutional: Positive for activity change. Negative for appetite change.  HENT: Negative.   Eyes: Negative.   Respiratory: Negative.  Negative for shortness of breath.   Cardiovascular: Negative.   Gastrointestinal: Negative.  Negative for abdominal pain.  Endocrine: Negative.   Genitourinary: Negative.   Musculoskeletal: Positive for back pain and neck pain.  Skin: Positive for color change. Negative for wound.  Neurological: Negative.   Psychiatric/Behavioral: Negative.     Past Medical History:  Diagnosis Date  . Anxiety    takes  Xanax prn   . Back pain    DDD  . Constipation   . Depression    takes Effexor daily  . Genital herpes   . GERD (gastroesophageal reflux disease)    takes Omeprazole daily  . Hemorrhoids   . History of bronchitis    last time 20months ago  . History of colon polyps   . History of migraine    last one 63yrs ago  . Hyperlipidemia    not on meds at present and Dr.McKenzie will review after surgery  . Insomnia   . Peripheral vascular disease ([place])   . Vitamin B12 deficiency   . Weakness    bil legs    Past Surgical History:  Procedure Laterality Date  . ANKLE SURGERY Left    with 3 screws and 2 pens  . 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 CERVICAL CONE BIOPSY    . colonscopy    . ECTOPIC PREGNANCY SURGERY     x 2  . left fallopian tube removed    . wisdom teeth extracted         Current Outpatient Medications:  .  ALPRAZolam (XANAX) 0.5 MG tablet, Take 0.5 mg by mouth at bedtime as needed for sleep., Disp: , Rfl:  .  aspirin 81  MG chewable tablet, Chew 1 tablet (81 mg total) by mouth daily., Disp: , Rfl:  .  atorvastatin (LIPITOR) 10 MG tablet, Take 1 tablet (10 mg total) by mouth daily at 6 PM., Disp: 60 tablet, Rfl: 10 .  NON FORMULARY, Super beets, Disp: , Rfl:  .  Nutritional Supplements (BIFIDO-GENIC GROWTH FACTORS PO), Take 4 tablets by mouth daily., Disp: , Rfl:  .  valACYclovir (VALTREX) 500 MG tablet, Take 500 mg by mouth as needed., Disp: , Rfl:  .  Venlafaxine HCl 75 MG TB24, Take 1 tablet by mouth daily., Disp: , Rfl:  .  vitamin B-12 (CYANOCOBALAMIN) 1000 MCG tablet, Take 1,000 mcg by mouth daily., Disp: , Rfl:    Objective:   Vitals:   05/03/19 0846  BP: 129/77  Pulse: 69  Temp: (!) 97.5 F (36.4 C)  SpO2: 100%    Physical Exam Vitals and nursing note reviewed.  Constitutional:      Appearance:  Normal appearance.  HENT:     Head: Normocephalic and atraumatic.  Eyes:     Extraocular Movements: Extraocular movements intact.  Cardiovascular:     Rate and Rhythm: Normal rate.  Pulmonary:     Effort: Pulmonary effort is normal.  Abdominal:     General: Abdomen is flat. There is no distension.     Tenderness: There is no abdominal tenderness.  Skin:    General: Skin is warm.     Capillary Refill: Capillary refill takes less than 2 seconds.  Neurological:     General: No focal deficit present.     Mental Status: She is alert and oriented to person, place, and time.  Psychiatric:        Mood and Affect: Mood normal.        Behavior: Behavior normal.        Thought Content: Thought content normal.     Assessment & Plan:  Symptomatic mammary hypertrophy  Patient is a good candidate for breast reduction.  We will provide her with a quote. She is aware she will have to be tobacco free for 6 weeks prior to surgery.   Pictures were obtained of the patient and placed in the chart with the patient's or guardian's permission.  The [place] was signed into law in 2016 which includes the topic of electronic health records.  This provides immediate access to information in MyChart.  This includes consultation notes, operative notes, office notes, lab results and pathology reports.  If you have any questions about what you read please let us know at your next visit or call us at the office.  We are right here with you.   [place], DO

## 2019-06-18 ENCOUNTER — Other Ambulatory Visit: Payer: Self-pay

## 2019-06-18 ENCOUNTER — Ambulatory Visit (INDEPENDENT_AMBULATORY_CARE_PROVIDER_SITE_OTHER): Payer: BC Managed Care – PPO | Admitting: Plastic Surgery

## 2019-06-18 ENCOUNTER — Encounter: Payer: Self-pay | Admitting: Plastic Surgery

## 2019-06-18 VITALS — BP 113/71 | HR 67 | Temp 98.0°F | Ht 67.0 in | Wt 159.2 lb

## 2019-06-18 DIAGNOSIS — N62 Hypertrophy of breast: Secondary | ICD-10-CM

## 2019-06-18 MED ORDER — ONDANSETRON HCL 4 MG PO TABS: 4.0000 mg | ORAL_TABLET | Freq: Three times a day (TID) | ORAL | 0 refills | Status: DC | PRN

## 2019-06-18 MED ORDER — HYDROCODONE-ACETAMINOPHEN 5-325 MG PO TABS: 1.0000 | ORAL_TABLET | Freq: Three times a day (TID) | ORAL | 0 refills | Status: AC | PRN

## 2019-06-18 MED ORDER — CEPHALEXIN 500 MG PO CAPS: 500.0000 mg | ORAL_CAPSULE | Freq: Four times a day (QID) | ORAL | 0 refills | Status: AC

## 2019-06-18 NOTE — Progress Notes (Signed)
No diagnosis found.    Patient ID: [name], female    DOB: 10-26-1960, 59 y.o.   MRN: [id number]   History of Present Illness: [name] is a 59 y.o.  female  with a history of mammary hyperplasia.  She presents for preoperative evaluation for upcoming procedure, bilateral breast reduction, scheduled for 218/21 with Dr. Marla Roe.  She has extremely large breasts causing symptoms that include the following: Back pain (upper and lower) and neck pain. She frequently pins bra cups higher on straps for better lift and relief. Notices relief when holding breast up in her hands. Shoulder straps causing grooves, pain occasionally requiring padding. Pain medication is sometimes required with motrin and tylenol.  She has marks and scars on her shoulders due to the weight. Activities that are hindered by enlarged breasts include: running and heavy exercise.  Her breasts are large and fairly symmetric.  Sternal to nipple distance on the right is 30 cm and the left is 30 cm.  The IMF distance is 17 cm.  She is 5 feet 7 inches tall and weighs 159 pounds.  Her preoperative bra size is 38 DD D cup.  She would like to be admitted to small C cup.  The estimated excess breast tissue to be removed at the time of surgery equals 250 g on the left and 250 g on the right.  Last mammogram September 2020, impression: No evidence of malignancy.  She was 1/2 pack a day smoker and has worked to cut back since her consult with Dr. Marla Roe.  Reports she stopped smoking 2 weeks ago.  She has no personal history of breast cancer but has a maternal great-grandmother who had breast cancer.  No history of blood clots.  The patient has not had problems with anesthesia.   Past Medical History: Allergies: Allergies  Allergen Reactions  . Chantix [Varenicline]     Unknown    . Septra [Sulfamethoxazole-Trimethoprim] Other (See Comments)    Unknown   . Simvastatin     Muscle cramps     Current  Medications:  Current Outpatient Medications:  .  ALPRAZolam (XANAX) 0.5 MG tablet, Take 0.5 mg by mouth at bedtime as needed for sleep., Disp: , Rfl:  .  aspirin 81 MG chewable tablet, Chew 1 tablet (81 mg total) by mouth daily., Disp: , Rfl:  .  atorvastatin (LIPITOR) 10 MG tablet, Take 1 tablet (10 mg total) by mouth daily at 6 PM., Disp: 60 tablet, Rfl: 10 .  NON FORMULARY, Super beets, Disp: , Rfl:  .  Nutritional Supplements (BIFIDO-GENIC GROWTH FACTORS PO), Take 4 tablets by mouth daily., Disp: , Rfl:  .  valACYclovir (VALTREX) 500 MG tablet, Take 500 mg by mouth as needed., Disp: , Rfl:  .  Venlafaxine HCl 75 MG TB24, Take 1 tablet by mouth daily., Disp: , Rfl:  .  vitamin B-12 (CYANOCOBALAMIN) 1000 MCG tablet, Take 1,000 mcg by mouth daily., Disp: , Rfl:   Past Medical Problems: Past Medical History:  Diagnosis Date  . Anxiety    takes Xanax prn   . Back pain    DDD  . Constipation   . Depression    takes Effexor daily  . Genital herpes   . GERD (gastroesophageal reflux disease)    takes Omeprazole daily  . Hemorrhoids   . History of bronchitis    last time 19months ago  . History of colon polyps   . History of migraine    last one  45yrs ago  . Hyperlipidemia    not on meds at present and Dr.McKenzie will review after surgery  . Insomnia   . Peripheral vascular disease ([place])   . Vitamin B12 deficiency   . Weakness    bil legs    Past Surgical History: Past Surgical History:  Procedure Laterality Date  . ANKLE SURGERY Left    with 3 screws and 2 pens  . 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 CERVICAL CONE BIOPSY    . colonscopy    . ECTOPIC PREGNANCY SURGERY     x 2  . left fallopian tube removed    . wisdom teeth extracted       Social History: Social History   Socioeconomic History  . Marital status:  Married    Spouse name: Not on file  . Number of children: 1  . Years of education: Not on file  . Highest education level: Not on file  Occupational History    Employer: [employer]  Tobacco Use  . Smoking status: Current Every Day Smoker    Packs/day: 0.50    Years: 35.00    Pack years: 17.50    Types: Cigarettes  . Smokeless tobacco: Never Used  Substance and Sexual Activity  . Alcohol use: Yes    Comment: weekends only  . Drug use: No  . Sexual activity: Yes  Other Topics Concern  . Not on file  Social History Narrative   Lives at home with husband.     Social Determinants of Health   Financial Resource Strain:   . Difficulty of Paying Living Expenses: Not on file  Food Insecurity:   . Worried About Charity fundraiser in the Last Year: Not on file  . Ran Out of Food in the Last Year: Not on file  Transportation Needs:   . Lack of Transportation (Medical): Not on file  . Lack of Transportation (Non-Medical): Not on file  Physical Activity:   . Days of Exercise per Week: Not on file  . Minutes of Exercise per Session: Not on file  Stress:   . Feeling of Stress : Not on file  Social Connections:   . Frequency of Communication with Friends and Family: Not on file  . Frequency of Social Gatherings with Friends and Family: Not on file  . Attends Religious Services: Not on file  . Active Member of Clubs or Organizations: Not on file  . Attends Archivist Meetings: Not on file  . Marital Status: Not on file  Intimate Partner Violence:   . Fear of Current or Ex-Partner: Not on file  . Emotionally Abused: Not on file  . Physically Abused: Not on file  . Sexually Abused: Not on file    Family History: Family History  Problem Relation Age of Onset  . Hyperlipidemia Mother   . Breast cancer Other     Review of Systems: Review of Systems  Constitutional: Negative for chills and fever.  HENT: Negative for congestion and sore throat.    Respiratory: Negative for cough and shortness of breath.   Cardiovascular: Negative for chest pain.  Gastrointestinal: Negative for abdominal pain, nausea and vomiting.  Musculoskeletal: Positive for back pain and neck pain. Negative for joint pain and myalgias.  Skin: Negative for itching and rash.  Physical Exam: Vital Signs BP 113/71 (BP Location: Left Arm, Patient Position: Sitting, Cuff Size: Normal)   Pulse 67   Temp 98 F (36.7 C) (Temporal)   Ht 5\' 7"  (1.702 m)   Wt 159 lb 3.2 oz (72.2 kg)   LMP 10/31/2010   SpO2 98%   BMI 24.93 kg/m  Physical Exam Nursing note reviewed. Exam conducted with a chaperone present.  Constitutional:      Appearance: Normal appearance. She is normal weight.  HENT:     Head: Normocephalic and atraumatic.  Eyes:     Extraocular Movements: Extraocular movements intact.  Cardiovascular:     Rate and Rhythm: Normal rate and regular rhythm.     Pulses: Normal pulses.     Heart sounds: Normal heart sounds.  Pulmonary:     Effort: Pulmonary effort is normal.     Breath sounds: Normal breath sounds. No wheezing, rhonchi or rales.  Abdominal:     General: Bowel sounds are normal.     Palpations: Abdomen is soft.  Musculoskeletal:        General: No swelling. Normal range of motion.     Cervical back: Normal range of motion.  Skin:    General: Skin is warm and dry.     Coloration: Skin is not pale.     Findings: No erythema or rash.  Neurological:     General: No focal deficit present.     Mental Status: She is alert and oriented to person, place, and time.  Psychiatric:        Mood and Affect: Mood normal.        Behavior: Behavior normal.        Thought Content: Thought content normal.        Judgment: Judgment normal.     Assessment/Plan:   Ms. Entler scheduled for bilateral breast reduction with Dr. Marla Roe.  Risks, benefits, and alternatives of procedure discussed, questions answered and consent obtained.    We discussed  extensively the increased risk smoking causes with breast reduction.  Patient verbalized understanding of these risks.  Agreed to push surgery out 4 weeks to around the first week in March in order to allow more time for the nicotine to get out of her system.   Caprini risk: moderate (risk factors 3).  Risks include 59 year old female, length of planned surgery.  Recommendation for mechanical or pharmacological prophylaxis.  Postop medication sent to pharmacy: Keflex, Zofran, and hydrocodone.  Patient occasionally takes Xanax, provided caution about combining Xanax with hydrocodone due to sedation effects.  Patient verbalized understanding  The risk that can be encountered with breast reduction were discussed and include the following but not limited to these:  Breast asymmetry, fluid accumulation, firmness of the breast, inability to breast feed, loss of nipple or areola, skin loss, decrease or no nipple sensation, fat necrosis of the breast tissue, bleeding, infection, healing delay.  There are risks of anesthesia, changes to skin sensation and injury to nerves or blood vessels.  The muscle can be temporarily or permanently injured.  You may have an allergic reaction to tape, suture, glue, blood products which can result in skin discoloration, swelling, pain, skin lesions, poor healing.  Any of these can lead to the need for revisonal surgery or stage procedures.  A reduction has potential to interfere with diagnostic procedures.  Nipple or breast piercing can increase risks of infection.  This procedure is best done when the breast is fully developed.  Changes in the  breast will continue to occur over time.  Pregnancy can alter the outcomes of previous breast reduction surgery, weight gain and weigh loss can also effect the long term appearance.   The [place] was signed into law in 2016 which includes the topic of electronic health records.  This provides immediate access to information in  MyChart.  This includes consultation notes, operative notes, office notes, lab results and pathology reports.  If you have any questions about what you read please let us know at your next visit or call us at the office.  We are right here with you.   Electronically signed by: Threasa Heads, PA-C 06/18/2019 2:44 PM

## 2019-07-12 ENCOUNTER — Encounter: Payer: BC Managed Care – PPO | Admitting: Plastic Surgery

## 2019-08-02 ENCOUNTER — Encounter: Payer: Self-pay | Admitting: Plastic Surgery

## 2019-08-16 ENCOUNTER — Encounter: Payer: Self-pay | Admitting: Plastic Surgery

## 2019-11-28 NOTE — Progress Notes (Signed)
Patient ID: [name], female    DOB: 06-18-1960, 59 y.o.   MRN: [id number]  Chief Complaint  Patient presents with  . Pre-op Exam      ICD-10-CM   1. Symptomatic mammary hypertrophy  N62   2. Tobacco abuse disorder  Z72.0 Nicotine/cotinine metabolites    History of Present Illness: [name] is a 59 y.o.  female  with a history of mammary hyperplase.  She presents for preoperative evaluation for upcoming procedure, bilateral breast reduction, scheduled for 01/02/20 at [facility] with Dr. Marla Roe. Patient was previously scheduled for 06/2019, but this was cancelled due to smoking.   The patient has not had problems with anesthesia. No history of DVT/PE.  No family history of DVT/PE.  No family or personal history of bleeding or clotting disorders.  Patient is not currently taking any blood thinners.  No history of CVA/MI.   Summary of Previous Visit: Patient's breasts are large and fairly symmetric, STM on the right is 30 cm and STN on left is 30 cm.  IMF is 17 cm.  Patient is 5 feet 7 inches tall and weighs 159 pounds.  Patient is a 38 triple D cup and would like to be a small C cup.  Estimated excess breast removed at time of surgery 250 g on the right and 250 g on the left.  Patient had mammogram in September 2020, impression is no sign of malignancy.  Patient reports that she would like to be a small C/medium C cup, but no larger. Patient reports that she has stopped smoking with the exception of 1 cigarette on 11/17/2019.  She reports she has not smoked since then and does not plan to smoke.  She reiterated that she is aware of the risk associated with smoking and nipple necrosis and poor wound healing.  PMH Significant for: Aorto bifemoral bypass, on chronic aspirin 81 mg daily and is followed by a vein vascular specialist in [place].  No history of GERD, hyperlipidemia peripheral vascular disease.    Past Medical History: Allergies: Allergies  Allergen Reactions  . Chantix  [Varenicline]     Unknown    . Septra [Sulfamethoxazole-Trimethoprim] Other (See Comments)    Unknown   . Simvastatin     Muscle cramps     Current Medications:  Current Outpatient Medications:  .  ALPRAZolam (XANAX) 0.5 MG tablet, Take 0.5 mg by mouth at bedtime as needed for sleep., Disp: , Rfl:  .  aspirin 81 MG chewable tablet, Chew 1 tablet (81 mg total) by mouth daily., Disp: , Rfl:  .  atorvastatin (LIPITOR) 10 MG tablet, Take 1 tablet (10 mg total) by mouth daily at 6 PM., Disp: 60 tablet, Rfl: 10 .  NON FORMULARY, Super beets, Disp: , Rfl:  .  Nutritional Supplements (BIFIDO-GENIC GROWTH FACTORS PO), Take 4 tablets by mouth daily., Disp: , Rfl:  .  valACYclovir (VALTREX) 500 MG tablet, Take 500 mg by mouth as needed., Disp: , Rfl:  .  Venlafaxine HCl 75 MG TB24, Take 1 tablet by mouth daily., Disp: , Rfl:  .  vitamin B-12 (CYANOCOBALAMIN) 1000 MCG tablet, Take 1,000 mcg by mouth daily., Disp: , Rfl:   Past Medical Problems: Past Medical History:  Diagnosis Date  . Anxiety    takes Xanax prn   . Back pain    DDD  . Constipation   . Depression    takes Effexor daily  . Genital herpes   . GERD (gastroesophageal reflux  disease)    takes Omeprazole daily  . Hemorrhoids   . History of bronchitis    last time 44months ago  . History of colon polyps   . History of migraine    last one 48yrs ago  . Hyperlipidemia    not on meds at present and Dr.McKenzie will review after surgery  . Insomnia   . Peripheral vascular disease ([place])   . Vitamin B12 deficiency   . Weakness    bil legs    Past Surgical History: Past Surgical History:  Procedure Laterality Date  . ANKLE SURGERY Left    with 3 screws and 2 pens  . AORTA - BILATERAL FEMORAL ARTERY BYPASS GRAFT N/A 01/30/2013   Procedure: AORTA BIFEMORAL BYPASS GRAFT;  Surgeon: Mal Misty, MD;  Location: [place];  Service: Vascular;  Laterality: N/A;  . CERVICAL CONE BIOPSY    . colonscopy    . ECTOPIC PREGNANCY SURGERY      x 2  . left fallopian tube removed    . wisdom teeth extracted       Social History: Social History   Socioeconomic History  . Marital status: Married    Spouse name: Not on file  . Number of children: 1  . Years of education: Not on file  . Highest education level: Not on file  Occupational History    Employer: [employer]  Tobacco Use  . Smoking status: Current Every Day Smoker    Packs/day: 0.50    Years: 35.00    Pack years: 17.50    Types: Cigarettes  . Smokeless tobacco: Never Used  Substance and Sexual Activity  . Alcohol use: Yes    Comment: weekends only  . Drug use: No  . Sexual activity: Yes  Other Topics Concern  . Not on file  Social History Narrative   Lives at home with husband.     Social Determinants of Health   Financial Resource Strain:   . Difficulty of Paying Living Expenses:   Food Insecurity:   . Worried About Charity fundraiser in the Last Year:   . Arboriculturist in the Last Year:   Transportation Needs:   . Film/video editor (Medical):   Marland Kitchen Lack of Transportation (Non-Medical):   Physical Activity:   . Days of Exercise per Week:   . Minutes of Exercise per Session:   Stress:   . Feeling of Stress :   Social Connections:   . Frequency of Communication with Friends and Family:   . Frequency of Social Gatherings with Friends and Family:   . Attends Religious Services:   . Active Member of Clubs or Organizations:   . Attends Archivist Meetings:   Marland Kitchen Marital Status:   Intimate Partner Violence:   . Fear of Current or Ex-Partner:   . Emotionally Abused:   Marland Kitchen Physically Abused:   . Sexually Abused:     Family History: Family History  Problem Relation Age of Onset  . Hyperlipidemia Mother   . Breast cancer Other     Review of Systems: Review of Systems  Constitutional: Negative.   Respiratory: Negative.   Cardiovascular: Negative.   Gastrointestinal: Negative.   Musculoskeletal: Positive  for back pain and neck pain.    Physical Exam: Vital Signs BP 122/77 (BP Location: Left Arm, Patient Position: Sitting, Cuff Size: Large)   Pulse 89   Temp 98.4 F (36.9 C) (Oral)   Ht 5\' 7"  (1.702  m)   Wt 159 lb 6.4 oz (72.3 kg)   LMP 10/31/2010   SpO2 98%   BMI 24.97 kg/m  Physical Exam Exam conducted with a chaperone present.  Constitutional:      General: She is not in acute distress.    Appearance: Normal appearance. She is not ill-appearing.  HENT:     Head: Normocephalic and atraumatic.  Eyes:     Pupils: Pupils are equal, round Neck:     Musculoskeletal: Normal range of motion.  Cardiovascular:     Rate and Rhythm: Normal rate and regular rhythm.     Pulses: Normal pulses.     Heart sounds: Normal heart sounds. No murmur.  Pulmonary:     Effort: Pulmonary effort is normal. No respiratory distress.     Breath sounds: Normal breath sounds. No wheezing.  Abdominal:     General: Abdomen is flat. There is no distension. Abdominal incision noted from previous vascular surgery    Palpations: Abdomen is soft.     Tenderness: There is no abdominal tenderness.  Musculoskeletal: Normal range of motion.  Skin:    General: Skin is warm and dry.     Findings: No erythema or rash.  Neurological:     General: No focal deficit present.     Mental Status: She is alert and oriented to person, place, and time. Mental status is at baseline.     Motor: No weakness.  Psychiatric:        Mood and Affect: Mood normal.        Behavior: Behavior normal.    Assessment/Plan: [name] is scheduled for bilateral breast reduction with possible liposuction with Dr. Marla Roe on 01/02/20.  Risks, benefits, and alternatives of procedure discussed, questions answered and consent obtained.    Smoking Status: Last reported cigarette was 11/17/19, reports she has not smoked since and will not prior to surgery. Nicotine test lab order provided. Recommend having this completed early august (2  weeks pre-op to allow it to come back in time); Counseling Given? Yes, discussed risk of nipple/areola necrosis due to nicotine.  Last Mammogram: 02/04/2019; Results: No evidence of malignancy.  Caprini Score: moderate, 3; Risk Factors include: age and length of planned surgery. Recommendation for mechanical prophylaxis during surgery. Encourage early ambulation.   Pictures obtained: 05/03/2019  Post-op Rx sent to pharmacy: Patient has keflex, zofran from 06/2019 pre-op. Will send norco after negative nicotine test.  Pre-op clearance to be sent to vascular and vein specialists of  as patient takes ASA 81mg  due to stents after aorta bifemoral bypass.   Patient was provided with the Breast reduction and General Surgical Risk consent document and Pain Medication Agreement prior to their appointment.  They had adequate time to read through the risk consent documents and Pain Medication Agreement. We also discussed them in person together during this preop appointment. All of their questions were answered to their satisfaction.  Recommended calling if they have any further questions.  Risk consent form and Pain Medication Agreement to be scanned into patient's chart.  The risk that can be encountered with breast reduction were discussed and include the following but not limited to these:  Breast asymmetry, fluid accumulation, firmness of the breast, inability to breast feed, loss of nipple or areola, skin loss, decrease or no nipple sensation, fat necrosis of the breast tissue, bleeding, infection, healing delay.  There are risks of anesthesia, changes to skin sensation and injury to nerves or blood vessels.  The muscle  can be temporarily or permanently injured.  You may have an allergic reaction to tape, suture, glue, blood products which can result in skin discoloration, swelling, pain, skin lesions, poor healing.  Any of these can lead to the need for revisonal surgery or stage procedures.  A  reduction has potential to interfere with diagnostic procedures.  Nipple or breast piercing can increase risks of infection.  This procedure is best done when the breast is fully developed.  Changes in the breast will continue to occur over time.  Pregnancy can alter the outcomes of previous breast reduction surgery, weight gain and weigh loss can also effect the long term appearance.    Electronically signed by: Lindsey Rhine Kaleiah Kutzer, PA-C 11/29/2019 8:58 AM

## 2019-11-29 ENCOUNTER — Other Ambulatory Visit: Payer: Self-pay

## 2019-11-29 ENCOUNTER — Encounter: Payer: Self-pay | Admitting: Surgical

## 2019-11-29 ENCOUNTER — Ambulatory Visit (INDEPENDENT_AMBULATORY_CARE_PROVIDER_SITE_OTHER): Payer: Self-pay | Admitting: Surgical

## 2019-11-29 VITALS — BP 122/77 | HR 89 | Temp 98.4°F | Ht 67.0 in | Wt 159.4 lb

## 2019-11-29 DIAGNOSIS — N62 Hypertrophy of breast: Secondary | ICD-10-CM

## 2019-11-29 DIAGNOSIS — Z720 Tobacco use: Secondary | ICD-10-CM

## 2019-12-02 ENCOUNTER — Telehealth: Payer: Self-pay | Admitting: Plastic Surgery

## 2019-12-02 ENCOUNTER — Other Ambulatory Visit: Payer: Self-pay | Admitting: Surgical

## 2019-12-02 DIAGNOSIS — I739 Peripheral vascular disease, unspecified: Secondary | ICD-10-CM

## 2019-12-02 NOTE — Telephone Encounter (Signed)
Received call from [facility] Vascular and Vein Specialist. Patient has not been seen since 2015 and needs an appointment before the surgery clearance will be reviewed. A referral to vascular and vein has been put in for Peripheral Vascular Disease - surgery clearance needed for 8/19 surgery. The office staff will review and schedule the soonest available appointment. I will wait to hear back from the scheduling staff tomorrow before calling Ms. Fernande Boyden. The office is booking into late August for appointments, so a delay is likely. I will advise accordingly and keep the providers and patient aware.

## 2019-12-10 DIAGNOSIS — Z719 Counseling, unspecified: Secondary | ICD-10-CM

## 2019-12-18 ENCOUNTER — Other Ambulatory Visit: Payer: Self-pay

## 2019-12-18 DIAGNOSIS — I739 Peripheral vascular disease, unspecified: Secondary | ICD-10-CM

## 2019-12-19 ENCOUNTER — Encounter (HOSPITAL_COMMUNITY): Payer: Self-pay

## 2019-12-19 ENCOUNTER — Encounter: Payer: Self-pay | Admitting: Vascular Surgery

## 2020-01-02 ENCOUNTER — Other Ambulatory Visit (INDEPENDENT_AMBULATORY_CARE_PROVIDER_SITE_OTHER): Payer: Self-pay | Admitting: Plastic Surgery

## 2020-01-02 ENCOUNTER — Other Ambulatory Visit: Payer: Self-pay | Admitting: Plastic Surgery

## 2020-01-02 DIAGNOSIS — Z719 Counseling, unspecified: Secondary | ICD-10-CM

## 2020-01-02 MED ORDER — IBUPROFEN 600 MG PO TABS: 600.0000 mg | ORAL_TABLET | Freq: Four times a day (QID) | ORAL | 0 refills | Status: DC | PRN

## 2020-01-02 MED ORDER — ACETAMINOPHEN 500 MG PO TABS: 500.0000 mg | ORAL_TABLET | Freq: Four times a day (QID) | ORAL | 0 refills | Status: DC | PRN

## 2020-01-02 MED ORDER — HYDROCODONE-ACETAMINOPHEN 5-325 MG PO TABS: 1.0000 | ORAL_TABLET | Freq: Three times a day (TID) | ORAL | 0 refills | Status: AC | PRN

## 2020-01-02 NOTE — Progress Notes (Signed)
Sent RX pain medication after surgery today to pharmacy

## 2020-01-03 ENCOUNTER — Telehealth: Payer: Self-pay | Admitting: Plastic Surgery

## 2020-01-03 NOTE — Telephone Encounter (Signed)
Patient needed some clarification on her post op instructions. Husband thought she had tubes in and couldn't shower until Sunday. [name] said she didn't have tubes and could shower. She would like to know which one is correct. When she does shower, what does she need to do about bandages. Please call her at [phone] to advise.

## 2020-01-03 NOTE — Telephone Encounter (Signed)
Returned pt's call re: pt is requesting verification of instructions/wound dressing care She states that her husband thought she had "drains" in place after surgery & they would like  instructions regarding showering & drain care I consulted with Dr. Marla Roe- she confirmed she did not place drains postop & pt can shower tomorrow- removing the gauze dressing/outer binder & leaving the adhesive dressing/cover on incisions- replace gauze dressing/binder after shower & as needed Pt indicated that she has the RX for pain med- & she is using this and alternating OTC Tylenol/Ibuprofen as needed She reports pain is 5/10- bilaterally & no chills/fever and no N/V She did report decreased appetite- but is taking fluids  Normal bladder function- I instructed her to hydrate well- & use recommended OTC stool softener/fiber as needed She is reminded to call for any further questions/concerns -otherwise she has f/u with Dr. Marla Roe on 01/10/20 Pt agrees with plan of care

## 2020-01-10 ENCOUNTER — Encounter: Payer: Self-pay | Admitting: Plastic Surgery

## 2020-01-10 ENCOUNTER — Other Ambulatory Visit: Payer: Self-pay

## 2020-01-10 ENCOUNTER — Ambulatory Visit (INDEPENDENT_AMBULATORY_CARE_PROVIDER_SITE_OTHER): Payer: Self-pay | Admitting: Plastic Surgery

## 2020-01-10 VITALS — BP 131/84 | HR 75 | Temp 98.1°F

## 2020-01-10 DIAGNOSIS — N62 Hypertrophy of breast: Secondary | ICD-10-CM

## 2020-01-10 NOTE — Progress Notes (Signed)
The patient is a 59 year old female here for follow-up after undergoing bilateral breast reduction.  She is doing extremely well.  She has some bruising and swelling as expected.  There does not appear to be any sign of a hematoma or seroma.  No sign of infection.  She had a little bit of diarrhea which stopped when she discontinued the pain medication 2 days ago.  She has not been eating really well and will try some protein shakes.  She stated that she just did not have much of an appetite.  She will also increase her water intake.  She can go into a sports bra.  I would like to see her back next week.  Will take the dressings off at that time.  She can walk but do not increase her activity otherwise.  No heavy lifting or workouts.

## 2020-01-15 NOTE — Progress Notes (Signed)
Patient is a 59 year old female here for follow-up after undergoing bilateral breast reduction on 01/02/2020 with Dr. Marla Roe.   ~ 2 weeks PO Patient reports she is doing well.  Bilateral dressings removed.  Bilateral incisions are healing very nicely, C/D/I.  No signs of infection, redness, drainage, seroma/hematoma.  Patient denies fever/chills, nausea/vomiting.  Left breast has some mild swelling.  Patient reports she sleeps on that side.  Recommend she sleep on her back.  Continue to wear compression garment 24/7 until 6 weeks postop.  Continue to avoid heavy lifting.  Follow-up in 3 weeks.  Call office with any questions/concerns.  The [place] was signed into law in 2016 which includes the topic of electronic health records.  This provides immediate access to information in MyChart.  This includes consultation notes, operative notes, office notes, lab results and pathology reports.  If you have any questions about what you read please let us know at your next visit or call us at the office.  We are right here with you.

## 2020-01-17 ENCOUNTER — Other Ambulatory Visit: Payer: Self-pay

## 2020-01-17 ENCOUNTER — Ambulatory Visit (INDEPENDENT_AMBULATORY_CARE_PROVIDER_SITE_OTHER): Payer: Self-pay | Admitting: Plastic Surgery

## 2020-01-17 ENCOUNTER — Encounter: Payer: Self-pay | Admitting: Plastic Surgery

## 2020-01-17 VITALS — HR 74 | Temp 98.5°F

## 2020-01-17 DIAGNOSIS — I70213 Atherosclerosis of native arteries of extremities with intermittent claudication, bilateral legs: Secondary | ICD-10-CM

## 2020-01-17 DIAGNOSIS — I739 Peripheral vascular disease, unspecified: Secondary | ICD-10-CM

## 2020-01-17 DIAGNOSIS — Z9889 Other specified postprocedural states: Secondary | ICD-10-CM

## 2020-01-31 ENCOUNTER — Encounter (HOSPITAL_COMMUNITY): Payer: Self-pay

## 2020-01-31 ENCOUNTER — Encounter: Payer: Self-pay | Admitting: Vascular Surgery

## 2020-01-31 ENCOUNTER — Inpatient Hospital Stay (HOSPITAL_COMMUNITY): Admission: RE | Admit: 2020-01-31 | Payer: Self-pay | Source: Ambulatory Visit

## 2020-02-03 ENCOUNTER — Telehealth: Payer: Self-pay | Admitting: Plastic Surgery

## 2020-02-03 NOTE — Telephone Encounter (Signed)
Returned patients call. Her BL breast reduction with lipo was on 01/02/20. Last office visit was on 01/27/20 with some mild swelling. Her left breast is now more swollen, warm to touch, and hurts on the outside of breast. She also mention that there bruising left side of her nipple. Advise her I will call her back after I talk with Dr. Marla Roe.

## 2020-02-03 NOTE — Telephone Encounter (Signed)
Patient called to say that her left breast is swollen with stabbing pain on the sides. At her last appt, it was advised that she may need to be drained and she should call sooner. Please call patient to advise.

## 2020-02-04 ENCOUNTER — Ambulatory Visit (INDEPENDENT_AMBULATORY_CARE_PROVIDER_SITE_OTHER): Payer: Self-pay | Admitting: Surgical

## 2020-02-04 ENCOUNTER — Other Ambulatory Visit: Payer: Self-pay

## 2020-02-04 ENCOUNTER — Encounter: Payer: Self-pay | Admitting: Surgical

## 2020-02-04 VITALS — BP 139/77 | HR 77 | Temp 98.1°F

## 2020-02-04 DIAGNOSIS — Z9889 Other specified postprocedural states: Secondary | ICD-10-CM

## 2020-02-04 NOTE — Progress Notes (Signed)
Patient is a 58 year old female here for follow-up after bilateral breast reduction on 01/02/2020 with Dr. Marla Roe. Patient called the office stating that she felt her left breast was larger and noticed some increased pain in her left breast. She is here today for evaluation of this.  She reports she is overall doing well, has some increased pain of the left breast. She is not having any fevers, chills, nausea, vomiting.  Chaperone present on exam on exam bilateral breast incisions are intact, some residual Dermabond noted, no erythema, no cellulitic changes, no incisional dehiscence. Left breast slightly larger with positive fluid wave noted. Bilateral breasts are soft without any significant tension on skin. Bilateral NAC's are viable with good color.  -Discussed with patient that overall everything appears to be healing well, we did aspirate the left breast using a sterile technique, no fluid was able to be aspirated. -Recommend continue wear compressive garment 24/7, discussed with patient she may need to purchase a smaller bra as the current sports bra was not providing much compression. -Recommend calling with any questions or concerns, scheduled additional follow-up for 3 weeks for reevaluation. Discussed with patient if she had any questions or changes in her status we would be happy to see her sooner.

## 2020-02-04 NOTE — Telephone Encounter (Signed)
Patient coming in for evaluation today

## 2020-02-10 ENCOUNTER — Ambulatory Visit: Payer: Self-pay | Admitting: Plastic Surgery

## 2020-02-14 HISTORY — PX: BREAST SURGERY: SHX581

## 2020-02-24 NOTE — Progress Notes (Signed)
Patient is a 59 yr-old female here for follow up after undergoing a bilateral breast reduction on 01/02/20 with Dr. Marla Roe.  At last visit on 9/21 patient had some swelling of the left breast, but no fluid was able to be aspirated. Recommended that patient wear more compressive garment.  ~ 8 weeks PO Patient reports overall she is feeling well.  Still concerned that the left breast is larger than the right (reports left breast was larger than the right prior to surgery also).  Upon exam the left breast does appear larger.  There is some swelling noted upon palpation.  Bilateral incisions are healing very nicely, C/D/I.  No signs of infection, redness, drainage.  Patient denies fever/chills, nausea/vomiting.  Patient denies breast pain.  Discussed option with patient to attempt to aspirate fluid from left breast or allow time to see if the body would reabsorb it.  Since aspiration was attempted at her last visit but no fluid was able to be obtained she would like to give it a little more time.  I think this is reasonable.  Follow-up in 3 to 4 weeks.  Continue to wear compression 24/7.  Call office with any questions/concerns.

## 2020-02-25 DIAGNOSIS — Z9889 Other specified postprocedural states: Secondary | ICD-10-CM | POA: Insufficient documentation

## 2020-02-26 ENCOUNTER — Encounter: Payer: Self-pay | Admitting: Plastic Surgery

## 2020-02-26 ENCOUNTER — Ambulatory Visit (INDEPENDENT_AMBULATORY_CARE_PROVIDER_SITE_OTHER): Payer: Self-pay | Admitting: Plastic Surgery

## 2020-02-26 ENCOUNTER — Other Ambulatory Visit: Payer: Self-pay

## 2020-02-26 VITALS — BP 137/77 | HR 86 | Temp 98.2°F

## 2020-02-26 DIAGNOSIS — Z9889 Other specified postprocedural states: Secondary | ICD-10-CM

## 2020-03-22 NOTE — Progress Notes (Signed)
Patient is a 59 year old female here for follow-up after undergoing a bilateral breast reduction on 01/02/2020 with Dr. Marla Roe.  At last visit on 10/13, left breast was slightly larger than the right breast.  Patient reports the left breast was larger prior to surgery.  There is some swelling present on the left breast.  At previous visit on 9/21 no fluid was able to be aspirated, so recommended allowing some more time for the breast to heal..  ~ 12 weeks PO Patient reports that her left breast is larger than her right breast.  Denies fever/chills, nausea/vomiting.  Bilateral incisions are healing very nicely, C/D/I.  No signs of infection, redness, drainage.  Negative for fluid wave.  No seromas palpable.  Attempt was made to aspirate fluid using sterile technique per patient preference; no fluid was able to be aspirated.  Recommend follow-up with Dr. Marla Roe.  Call office with any questions/concerns.

## 2020-03-23 ENCOUNTER — Other Ambulatory Visit: Payer: Self-pay | Admitting: Family Medicine

## 2020-03-23 DIAGNOSIS — Z1231 Encounter for screening mammogram for malignant neoplasm of breast: Secondary | ICD-10-CM

## 2020-03-25 ENCOUNTER — Encounter: Payer: Self-pay | Admitting: Plastic Surgery

## 2020-03-25 ENCOUNTER — Other Ambulatory Visit: Payer: Self-pay

## 2020-03-25 ENCOUNTER — Ambulatory Visit: Payer: Self-pay

## 2020-03-25 ENCOUNTER — Ambulatory Visit (INDEPENDENT_AMBULATORY_CARE_PROVIDER_SITE_OTHER): Payer: Self-pay | Admitting: Plastic Surgery

## 2020-03-25 VITALS — BP 123/90 | HR 84 | Temp 98.3°F

## 2020-03-25 DIAGNOSIS — Z9889 Other specified postprocedural states: Secondary | ICD-10-CM

## 2020-03-25 DIAGNOSIS — Z720 Tobacco use: Secondary | ICD-10-CM

## 2020-04-06 ENCOUNTER — Ambulatory Visit: Payer: Self-pay | Attending: Internal Medicine

## 2020-04-06 DIAGNOSIS — Z23 Encounter for immunization: Secondary | ICD-10-CM

## 2020-04-06 NOTE — Progress Notes (Signed)
   Covid-19 Vaccination Clinic  Name:  Malie Kashani    MRN: [id number] DOB: Mar 21, 1961  04/06/2020  Ms. Buchbinder was observed post Covid-19 immunization for 15 minutes without incident. She was provided with Vaccine Information Sheet and instruction to access the V-Safe system.   Ms. Finlayson was instructed to call 911 with any severe reactions post vaccine: Marland Kitchen Difficulty breathing  . Swelling of face and throat  . A fast heartbeat  . A bad rash all over body  . Dizziness and weakness   Immunizations Administered    Name Date Dose VIS Date Route   JANSSEN COVID-19 VACCINE 04/06/2020  1:29 PM 0.5 mL 03/04/2020 Intramuscular   Manufacturer: [employer]   Lot: [id number]   [place]: [phone]

## 2020-04-21 ENCOUNTER — Ambulatory Visit (INDEPENDENT_AMBULATORY_CARE_PROVIDER_SITE_OTHER): Payer: Self-pay | Admitting: Plastic Surgery

## 2020-04-21 ENCOUNTER — Other Ambulatory Visit: Payer: Self-pay

## 2020-04-21 ENCOUNTER — Encounter: Payer: Self-pay | Admitting: Plastic Surgery

## 2020-04-21 VITALS — BP 142/87 | HR 82 | Temp 98.1°F

## 2020-04-21 DIAGNOSIS — N62 Hypertrophy of breast: Secondary | ICD-10-CM

## 2020-04-21 NOTE — Progress Notes (Signed)
   Subjective:    Patient ID: [name], female    DOB: Dec 05, 1960, 59 y.o.   MRN: [id number]  The patient is a 59 year old female here for follow-up after undergoing a breast reduction in August 2021. She thinks that the left breast may be 1 cup size larger than the right. She has been wearing sports bras since the surgery and has not gone into a regular bra yet. She is happy she just wants to be sure she is as good as she can be. Her incisions have healed very nicely there are no lumps or bumps that are concerning. In person I see a little bit of a difference with the left side being larger. To me it does not look a whole cup size larger but it may certainly feel that way to the patient. The patient is aware of her asymmetry prior to surgery with the left side being larger. She understands that there may be some chest wall differences.     Review of Systems  Constitutional: Negative.   HENT: Negative.   Eyes: Negative.   Respiratory: Negative.   Cardiovascular: Negative.   Gastrointestinal: Negative.   Genitourinary: Negative.   Musculoskeletal: Negative.   Hematological: Negative.   Psychiatric/Behavioral: Negative.        Objective:   Physical Exam Vitals and nursing note reviewed.  Constitutional:      Appearance: Normal appearance.  HENT:     Head: Normocephalic and atraumatic.  Cardiovascular:     Rate and Rhythm: Normal rate.     Pulses: Normal pulses.  Neurological:     General: No focal deficit present.     Mental Status: She is alert and oriented to person, place, and time.  Psychiatric:        Mood and Affect: Mood normal.        Behavior: Behavior normal.        Assessment & Plan:     ICD-10-CM   1. Symptomatic mammary hypertrophy  N62     We discussed options for revision. If she wants a little revision to the left breast we could probably do that in the office. If she wants a dramatic change then that may require more local anesthesia and then we can  give her in the office and therefore that would need to be done in the OR. We talked about trying on a bra. Trying on a swim suit. See how she feels and looks and let us plan on talking again in a week or 2. Patient seems happy with that and will talk more and see what we can do to achieve more symmetry.  Pictures were obtained of the patient and placed in the chart with the patient's or guardian's permission.

## 2020-05-12 ENCOUNTER — Telehealth: Payer: Self-pay | Admitting: Plastic Surgery

## 2020-05-12 ENCOUNTER — Other Ambulatory Visit: Payer: Self-pay

## 2020-06-09 ENCOUNTER — Other Ambulatory Visit: Payer: Self-pay | Admitting: Internal Medicine

## 2020-06-09 DIAGNOSIS — Z1231 Encounter for screening mammogram for malignant neoplasm of breast: Secondary | ICD-10-CM

## 2020-06-10 ENCOUNTER — Other Ambulatory Visit: Payer: Self-pay

## 2020-06-11 ENCOUNTER — Encounter: Payer: Self-pay | Admitting: Internal Medicine

## 2020-06-11 ENCOUNTER — Telehealth: Payer: BC Managed Care – PPO | Admitting: Internal Medicine

## 2020-06-11 ENCOUNTER — Encounter: Payer: BC Managed Care – PPO | Admitting: Internal Medicine

## 2020-06-11 DIAGNOSIS — F411 Generalized anxiety disorder: Secondary | ICD-10-CM | POA: Diagnosis not present

## 2020-06-11 DIAGNOSIS — Z20822 Contact with and (suspected) exposure to covid-19: Secondary | ICD-10-CM

## 2020-06-11 DIAGNOSIS — E785 Hyperlipidemia, unspecified: Secondary | ICD-10-CM

## 2020-06-11 DIAGNOSIS — I739 Peripheral vascular disease, unspecified: Secondary | ICD-10-CM

## 2020-06-11 DIAGNOSIS — B009 Herpesviral infection, unspecified: Secondary | ICD-10-CM

## 2020-06-11 MED ORDER — VALACYCLOVIR HCL 500 MG PO TABS: 500.0000 mg | ORAL_TABLET | ORAL | 0 refills | Status: DC | PRN

## 2020-06-11 MED ORDER — ALPRAZOLAM 0.5 MG PO TABS: 0.5000 mg | ORAL_TABLET | Freq: Every evening | ORAL | 1 refills | Status: DC | PRN

## 2020-06-11 MED ORDER — ATORVASTATIN CALCIUM 10 MG PO TABS: 10.0000 mg | ORAL_TABLET | Freq: Every day | ORAL | 1 refills | Status: DC

## 2020-06-11 MED ORDER — ROSUVASTATIN CALCIUM 20 MG PO TABS: 20.0000 mg | ORAL_TABLET | Freq: Every day | ORAL | 1 refills | Status: DC

## 2020-06-11 NOTE — Progress Notes (Signed)
Virtual Visit via Video Note  I connected with [name] on 06/11/20 at  3:00 PM EST by a video enabled telemedicine application and verified that I am speaking with the correct person using two identifiers.  Location patient: home Location provider: work office Persons participating in the virtual visit: patient, provider  I discussed the limitations of evaluation and management by telemedicine and the availability of in person appointments. The patient expressed understanding and agreed to proceed.   HPI: This visit was scheduled as an in person new patient appointment, however upon check-in she reported a sore throat and was rescheduled as virtual.  She is here mainly to establish care.  Her past medical history is significant for peripheral vascular disease with an aortobifemoral bypass in 2016, hyperlipidemia, generalized anxiety disorder who uses Xanax 0.5 mg very sparingly, maybe once or twice a month.  Her prior PCP had started her on Effexor 75 mg years ago for perimenopausal vasomotor symptoms.  When she stopped seeing her prior PCP her medications were not renewed.  It has been over 3 months since she has had any medications she does not feel like stopping Effexor has had any effect.  She would like to have some Xanax in case of emergency.  She also uses Valtrex as needed for fever blisters.  She has been having a sore throat for the past 2 days.  Denies fever, has had some congestion, no body aches or shortness of breath.   ROS: Constitutional: Denies fever, chills, diaphoresis, appetite change and fatigue.  HEENT: Denies photophobia, eye pain, redness, hearing loss, ear pain,  mouth sores, trouble swallowing, neck pain, neck stiffness and tinnitus.   Respiratory: Denies SOB, DOE, cough, chest tightness,  and wheezing.   Cardiovascular: Denies chest pain, palpitations and leg swelling.  Gastrointestinal: Denies nausea, vomiting, abdominal pain, diarrhea, constipation, blood  in stool and abdominal distention.  Genitourinary: Denies dysuria, urgency, frequency, hematuria, flank pain and difficulty urinating.  Endocrine: Denies: hot or cold intolerance, sweats, changes in hair or nails, polyuria, polydipsia. Musculoskeletal: Denies myalgias, back pain, joint swelling, arthralgias and gait problem.  Skin: Denies pallor, rash and wound.  Neurological: Denies dizziness, seizures, syncope, weakness, light-headedness, numbness and headaches.  Hematological: Denies adenopathy. Easy bruising, personal or family bleeding history  Psychiatric/Behavioral: Denies suicidal ideation, mood changes, confusion, nervousness, sleep disturbance and agitation   Past Medical History:  Diagnosis Date  . Anxiety    takes Xanax prn   . Back pain    DDD  . Constipation   . Depression    takes Effexor daily  . Genital herpes   . GERD (gastroesophageal reflux disease)    takes Omeprazole daily  . Hemorrhoids   . History of bronchitis    last time 26months ago  . History of colon polyps   . History of migraine    last one 69yrs ago  . Hyperlipidemia    not on meds at present and Dr.McKenzie will review after surgery  . Insomnia   . Peripheral vascular disease ([place])   . Vitamin B12 deficiency   . Weakness    bil legs    Past Surgical History:  Procedure Laterality Date  . ANKLE SURGERY Left    with 3 screws and 2 pens  . AORTA - BILATERAL FEMORAL ARTERY BYPASS GRAFT N/A 01/30/2013   Procedure: AORTA BIFEMORAL BYPASS GRAFT;  Surgeon: Mal Misty, MD;  Location: [facility];  Service: Vascular;  Laterality: N/A;  . CERVICAL CONE  BIOPSY    . colonscopy    . ECTOPIC PREGNANCY SURGERY     x 2  . left fallopian tube removed    . wisdom teeth extracted       Family History  Problem Relation Age of Onset  . Hyperlipidemia Mother   . Breast cancer Other     SOCIAL HX:   reports that she has been smoking cigarettes. She has a 17.50 pack-year smoking history. She has never  used smokeless tobacco. She reports current alcohol use. She reports that she does not use drugs.   Current Outpatient Medications:  .  ALPRAZolam (XANAX) 0.5 MG tablet, Take 1 tablet (0.5 mg total) by mouth at bedtime as needed for sleep., Disp: 30 tablet, Rfl: 1 .  aspirin 81 MG chewable tablet, Chew 1 tablet (81 mg total) by mouth daily., Disp: , Rfl:  .  atorvastatin (LIPITOR) 10 MG tablet, Take 1 tablet (10 mg total) by mouth daily at 6 PM., Disp: 90 tablet, Rfl: 1 .  Nutritional Supplements (BIFIDO-GENIC GROWTH FACTORS PO), Take 4 tablets by mouth daily., Disp: , Rfl:  .  valACYclovir (VALTREX) 500 MG tablet, Take 1 tablet (500 mg total) by mouth as needed., Disp: 30 tablet, Rfl: 0 .  Venlafaxine HCl 75 MG TB24, Take 1 tablet by mouth daily., Disp: , Rfl:  .  vitamin B-12 (CYANOCOBALAMIN) 1000 MCG tablet, Take 1,000 mcg by mouth daily., Disp: , Rfl:   EXAM:   VITALS per patient if applicable: None reported  GENERAL: alert, oriented, appears well and in no acute distress.  Voice is hoarse.  HEENT: atraumatic, conjunttiva clear, no obvious abnormalities on inspection of external nose and ears  NECK: normal movements of the head and neck  LUNGS: on inspection no signs of respiratory distress, breathing rate appears normal, no obvious gross increased work of breathing, gasping or wheezing  CV: no obvious cyanosis  MS: moves all visible extremities without noticeable abnormality  PSYCH/NEURO: pleasant and cooperative, no obvious depression or anxiety, speech and thought processing grossly intact  ASSESSMENT AND PLAN:   Peripheral vascular disease, unspecified ([place])  -She would like a referral to vein and vascular for follow-up, she is not having any symptoms of claudication.  GAD (generalized anxiety disorder)  - Plan: ALPRAZolam (XANAX) 0.5 MG tablet; she uses this very sparingly, maybe once or twice a month. -She would like to try and wean off Effexor since it has been 90  days since she last took her dose.  Hyperlipidemia, unspecified hyperlipidemia type Herpes simplex  - Plan: valACYclovir (VALTREX) 500 MG tablet to use as needed  Suspected COVID-19 virus infection -I have instructed her to get Covid tested and notify me with results.  She should quarantine until she receives either negative test result or 10 days after a positive result. -She can use over-the-counter symptomatic management such as pain relievers, antihistamines, cough suppressants, decongestants.     I discussed the assessment and treatment plan with the patient. The patient was provided an opportunity to ask questions and all were answered. The patient agreed with the plan and demonstrated an understanding of the instructions.   The patient was advised to call back or seek an in-person evaluation if the symptoms worsen or if the condition fails to improve as anticipated.    Lelon Frohlich, MD  [facility] at [facility]

## 2020-06-12 ENCOUNTER — Encounter: Payer: Self-pay | Admitting: Internal Medicine

## 2020-06-12 ENCOUNTER — Telehealth: Payer: Self-pay | Admitting: Internal Medicine

## 2020-06-12 NOTE — Telephone Encounter (Signed)
Pt is calling in to let Dr. Jerilee Hoh know that she took the COVID test and it came back negative and she stated that her throat is still sore and will be sending a picture of the throat on mychart to see if Dr. Jerilee Hoh would send something in for her.  Pt would like to have a call back.

## 2020-06-16 ENCOUNTER — Other Ambulatory Visit: Payer: Self-pay | Admitting: Internal Medicine

## 2020-06-16 MED ORDER — VENLAFAXINE HCL ER 75 MG PO CP24: 75.0000 mg | ORAL_CAPSULE | Freq: Every day | ORAL | 2 refills | Status: DC

## 2020-06-16 MED ORDER — VENLAFAXINE HCL ER 75 MG PO TB24: 1.0000 | ORAL_TABLET | Freq: Every day | ORAL | 2 refills | Status: DC

## 2020-06-16 NOTE — Addendum Note (Signed)
Addended by: Anderson Malta on: 06/16/2020 11:55 AM   Modules accepted: Orders

## 2020-06-16 NOTE — Telephone Encounter (Signed)
Attempted to call the patient, but unable to leave a message. Rx sent Medication list updated

## 2020-06-18 ENCOUNTER — Ambulatory Visit: Payer: BC Managed Care – PPO | Admitting: Internal Medicine

## 2020-06-18 ENCOUNTER — Encounter: Payer: Self-pay | Admitting: Internal Medicine

## 2020-06-18 ENCOUNTER — Other Ambulatory Visit: Payer: Self-pay

## 2020-06-18 VITALS — BP 120/84 | HR 88 | Temp 98.2°F | Wt 163.9 lb

## 2020-06-18 DIAGNOSIS — H66013 Acute suppurative otitis media with spontaneous rupture of ear drum, bilateral: Secondary | ICD-10-CM

## 2020-06-18 MED ORDER — AMOXICILLIN-POT CLAVULANATE 875-125 MG PO TABS: 1.0000 | ORAL_TABLET | Freq: Two times a day (BID) | ORAL | 0 refills | Status: AC

## 2020-06-18 NOTE — Progress Notes (Signed)
Acute office Visit     This visit occurred during the SARS-CoV-2 public health emergency.  Safety protocols were in place, including screening questions prior to the visit, additional usage of staff PPE, and extensive cleaning of exam room while observing appropriate contact time as indicated for disinfecting solutions.    CC/Reason for Visit: Continued sore throat and bilateral ear pain  HPI: [name] is a 60 y.o. female who is coming in today for the above mentioned reasons.  We did a virtual visit on 1/27 at which time she was complaining of a sore throat and headache.  We asked her to get Covid tested which she did.  She brings proof today of 2 - Covid PCRs 1 on January 27 and the second 1 on February 1.  Because of her 2 recent negative Covid test I have decided to see her in person today.  She continues to have worsening, severe throat soreness and has now progressed to having bilateral, right greater than left ear pain.  She feels a lot of popping noises out of her ear.  Over the bit of pain when she moves her jaw.  No fever.  She believes she has seen white "pus pockets" on her soft palate.   Past Medical/Surgical History: Past Medical History:  Diagnosis Date  . Anxiety    takes Xanax prn   . Back pain    DDD  . Constipation   . Depression    takes Effexor daily  . Genital herpes   . GERD (gastroesophageal reflux disease)    takes Omeprazole daily  . Hemorrhoids   . History of bronchitis    last time 4months ago  . History of colon polyps   . History of migraine    last one 57yrs ago  . Hyperlipidemia    not on meds at present and Dr.McKenzie will review after surgery  . Insomnia   . Peripheral vascular disease ([place])   . Vitamin B12 deficiency   . Weakness    bil legs    Past Surgical History:  Procedure Laterality Date  . ANKLE SURGERY Left    with 3 screws and 2 pens  . AORTA - BILATERAL FEMORAL ARTERY BYPASS GRAFT N/A 01/30/2013   Procedure:  AORTA BIFEMORAL BYPASS GRAFT;  Surgeon: Mal Misty, MD;  Location: [facility];  Service: Vascular;  Laterality: N/A;  . CERVICAL CONE BIOPSY    . colonscopy    . ECTOPIC PREGNANCY SURGERY     x 2  . left fallopian tube removed    . wisdom teeth extracted       Social History:  reports that she has been smoking cigarettes. She has a 17.50 pack-year smoking history. She has never used smokeless tobacco. She reports current alcohol use. She reports that she does not use drugs.  Allergies: Allergies  Allergen Reactions  . Chantix [Varenicline]     Unknown    . Lipitor [Atorvastatin]     Muscle aches  . Septra [Sulfamethoxazole-Trimethoprim] Other (See Comments)    Unknown   . Simvastatin     Muscle cramps     Family History:  Family History  Problem Relation Age of Onset  . Hyperlipidemia Mother   . Breast cancer Other      Current Outpatient Medications:  .  ALPRAZolam (XANAX) 0.5 MG tablet, Take 1 tablet (0.5 mg total) by mouth at bedtime as needed for sleep., Disp: 30 tablet, Rfl: 1 .  amoxicillin-clavulanate (AUGMENTIN) 875-125 MG tablet, Take 1 tablet by mouth 2 (two) times daily for 7 days., Disp: 14 tablet, Rfl: 0 .  aspirin 81 MG chewable tablet, Chew 1 tablet (81 mg total) by mouth daily., Disp: , Rfl:  .  Nutritional Supplements (BIFIDO-GENIC GROWTH FACTORS PO), Take 4 tablets by mouth daily., Disp: , Rfl:  .  rosuvastatin (CRESTOR) 20 MG tablet, Take 1 tablet (20 mg total) by mouth daily., Disp: 90 tablet, Rfl: 1 .  valACYclovir (VALTREX) 500 MG tablet, Take 1 tablet (500 mg total) by mouth as needed., Disp: 30 tablet, Rfl: 0 .  venlafaxine XR (EFFEXOR-XR) 75 MG 24 hr capsule, Take 1 capsule (75 mg total) by mouth daily with breakfast., Disp: 30 capsule, Rfl: 2 .  vitamin B-12 (CYANOCOBALAMIN) 1000 MCG tablet, Take 1,000 mcg by mouth daily., Disp: , Rfl:   Review of Systems:  Constitutional: Denies fever, chills, diaphoresis, appetite change.  HEENT: Denies  photophobia, eye pain, redness, hearing loss,mouth sores, trouble swallowing, neck pain, neck stiffness and tinnitus.   Respiratory: Denies SOB, DOE, cough, chest tightness,  and wheezing.   Cardiovascular: Denies chest pain, palpitations and leg swelling.  Gastrointestinal: Denies nausea, vomiting, abdominal pain, diarrhea, constipation, blood in stool and abdominal distention.  Genitourinary: Denies dysuria, urgency, frequency, hematuria, flank pain and difficulty urinating.  Endocrine: Denies: hot or cold intolerance, sweats, changes in hair or nails, polyuria, polydipsia. Musculoskeletal: Denies myalgias, back pain, joint swelling, arthralgias and gait problem.  Skin: Denies pallor, rash and wound.  Neurological: Denies dizziness, seizures, syncope, weakness, light-headedness, numbness and headaches.  Hematological: Denies adenopathy. Easy bruising, personal or family bleeding history  Psychiatric/Behavioral: Denies suicidal ideation, mood changes, confusion, nervousness, sleep disturbance and agitation    Physical Exam: Vitals:   06/18/20 1548  BP: 120/84  Pulse: 88  Temp: 98.2 F (36.8 C)  TempSrc: Oral  SpO2: 99%  Weight: 163 lb 14.4 oz (74.3 kg)    Body mass index is 25.67 kg/m.   Constitutional: NAD, calm, comfortable Eyes: PERRL, lids and conjunctivae normal ENMT: Mucous membranes are moist. Posterior pharynx is erythematous but clear of any exudate or lesions. Normal dentition. Tympanic membrane is erythematous with bilateral air-fluid levels. Neck: normal, supple, no masses, no thyromegaly Respiratory: clear to auscultation bilaterally, no wheezing, no crackles. Normal respiratory effort. No accessory muscle use.  Cardiovascular: Regular rate and rhythm, no murmurs / rubs / gallops. No extremity edema. 2+ pedal pulses. No carotid bruits.  Neurologic: Grossly intact and nonfocal Psychiatric: Normal judgment and insight. Alert and oriented x 3. Normal mood.     Impression and Plan:  Non-recurrent acute suppurative otitis media of both ears with spontaneous rupture of tympanic membranes  -Start Augmentin 875 mg twice daily for 7 days, add Mucinex D for decongestant purposes. -May take OTC Tylenol or ibuprofen as needed for pain.    Patient Instructions  -Nice seeing you today!!  -Start augmentin 875/125 mg twice daily for 7 days.  -Start mucienx-D 1 tablet twice daily.   Otitis Media, Adult  Otitis media is a condition in which the middle ear is red and swollen (inflamed) and full of fluid. The middle ear is the part of the ear that contains bones for hearing as well as air that helps send sounds to the brain. The condition usually goes away on its own. What are the causes? This condition is caused by a blockage in the eustachian tube. The eustachian tube connects the middle ear to the back of the  nose. It normally allows air into the middle ear. The blockage is caused by fluid or swelling. Problems that can cause blockage include:  A cold or infection that affects the nose, mouth, or throat.  Allergies.  An irritant, such as tobacco smoke.  Adenoids that have become large. The adenoids are soft tissue located in the back of the throat, behind the nose and the roof of the mouth.  Growth or swelling in the upper part of the throat, just behind the nose (nasopharynx).  Damage to the ear caused by change in pressure. This is called barotrauma. What are the signs or symptoms? Symptoms of this condition include:  Ear pain.  Fever.  Problems with hearing.  Being tired.  Fluid leaking from the ear.  Ringing in the ear. How is this treated? This condition can go away on its own within 3-5 days. But if the condition is caused by bacteria or does not go away on its own, or if it keeps coming back, your doctor may:  Give you antibiotic medicines.  Give you medicines for pain. Follow these instructions at home:  Take  over-the-counter and prescription medicines only as told by your doctor.  If you were prescribed an antibiotic medicine, take it as told by your doctor. Do not stop taking the antibiotic even if you start to feel better.  Keep all follow-up visits as told by your doctor. This is important. Contact a doctor if:  You have bleeding from your nose.  There is a lump on your neck.  You are not feeling better in 5 days.  You feel worse instead of better. Get help right away if:  You have pain that is not helped with medicine.  You have swelling, redness, or pain around your ear.  You get a stiff neck.  You cannot move part of your face (paralysis).  You notice that the bone behind your ear hurts when you touch it.  You get a very bad headache. Summary  Otitis media means that the middle ear is red, swollen, and full of fluid.  This condition usually goes away on its own.  If the problem does not go away, treatment may be needed. You may be given medicines to treat the infection or to treat your pain.  If you were prescribed an antibiotic medicine, take it as told by your doctor. Do not stop taking the antibiotic even if you start to feel better.  Keep all follow-up visits as told by your doctor. This is important. This information is not intended to replace advice given to you by your health care provider. Make sure you discuss any questions you have with your health care provider. Document Revised: 04/04/2019 Document Reviewed: 04/04/2019 Elsevier Patient Education  2021 [place], MD [facility]

## 2020-06-18 NOTE — Patient Instructions (Signed)
-  Nice seeing you today!!  -Start augmentin 875/125 mg twice daily for 7 days.  -Start mucienx-D 1 tablet twice daily.   Otitis Media, Adult  Otitis media is a condition in which the middle ear is red and swollen (inflamed) and full of fluid. The middle ear is the part of the ear that contains bones for hearing as well as air that helps send sounds to the brain. The condition usually goes away on its own. What are the causes? This condition is caused by a blockage in the eustachian tube. The eustachian tube connects the middle ear to the back of the nose. It normally allows air into the middle ear. The blockage is caused by fluid or swelling. Problems that can cause blockage include:  A cold or infection that affects the nose, mouth, or throat.  Allergies.  An irritant, such as tobacco smoke.  Adenoids that have become large. The adenoids are soft tissue located in the back of the throat, behind the nose and the roof of the mouth.  Growth or swelling in the upper part of the throat, just behind the nose (nasopharynx).  Damage to the ear caused by change in pressure. This is called barotrauma. What are the signs or symptoms? Symptoms of this condition include:  Ear pain.  Fever.  Problems with hearing.  Being tired.  Fluid leaking from the ear.  Ringing in the ear. How is this treated? This condition can go away on its own within 3-5 days. But if the condition is caused by bacteria or does not go away on its own, or if it keeps coming back, your doctor may:  Give you antibiotic medicines.  Give you medicines for pain. Follow these instructions at home:  Take over-the-counter and prescription medicines only as told by your doctor.  If you were prescribed an antibiotic medicine, take it as told by your doctor. Do not stop taking the antibiotic even if you start to feel better.  Keep all follow-up visits as told by your doctor. This is important. Contact a doctor  if:  You have bleeding from your nose.  There is a lump on your neck.  You are not feeling better in 5 days.  You feel worse instead of better. Get help right away if:  You have pain that is not helped with medicine.  You have swelling, redness, or pain around your ear.  You get a stiff neck.  You cannot move part of your face (paralysis).  You notice that the bone behind your ear hurts when you touch it.  You get a very bad headache. Summary  Otitis media means that the middle ear is red, swollen, and full of fluid.  This condition usually goes away on its own.  If the problem does not go away, treatment may be needed. You may be given medicines to treat the infection or to treat your pain.  If you were prescribed an antibiotic medicine, take it as told by your doctor. Do not stop taking the antibiotic even if you start to feel better.  Keep all follow-up visits as told by your doctor. This is important. This information is not intended to replace advice given to you by your health care provider. Make sure you discuss any questions you have with your health care provider. Document Revised: 04/04/2019 Document Reviewed: 04/04/2019 Elsevier Patient Education  2021 [employer].

## 2020-07-01 ENCOUNTER — Ambulatory Visit
Admission: RE | Admit: 2020-07-01 | Discharge: 2020-07-01 | Disposition: A | Discharge status: Home / Self Care | Payer: BC Managed Care – PPO | Source: Ambulatory Visit

## 2020-07-01 ENCOUNTER — Other Ambulatory Visit: Payer: Self-pay

## 2020-07-01 DIAGNOSIS — Z1231 Encounter for screening mammogram for malignant neoplasm of breast: Secondary | ICD-10-CM

## 2020-07-03 ENCOUNTER — Encounter: Payer: Self-pay | Admitting: Obstetrics and Gynecology

## 2020-07-03 ENCOUNTER — Other Ambulatory Visit: Payer: Self-pay | Admitting: *Deleted

## 2020-07-03 DIAGNOSIS — I70213 Atherosclerosis of native arteries of extremities with intermittent claudication, bilateral legs: Secondary | ICD-10-CM

## 2020-07-03 DIAGNOSIS — I739 Peripheral vascular disease, unspecified: Secondary | ICD-10-CM

## 2020-07-07 ENCOUNTER — Other Ambulatory Visit: Payer: Self-pay | Admitting: Internal Medicine

## 2020-07-07 DIAGNOSIS — B009 Herpesviral infection, unspecified: Secondary | ICD-10-CM

## 2020-07-08 ENCOUNTER — Other Ambulatory Visit: Payer: Self-pay | Admitting: Internal Medicine

## 2020-07-21 ENCOUNTER — Other Ambulatory Visit: Payer: Self-pay

## 2020-07-21 ENCOUNTER — Ambulatory Visit (HOSPITAL_COMMUNITY)
Admission: RE | Admit: 2020-07-21 | Discharge: 2020-07-21 | Disposition: A | Discharge status: Home / Self Care | Payer: BC Managed Care – PPO | Source: Ambulatory Visit | Attending: Vascular Surgery | Admitting: Vascular Surgery

## 2020-07-21 ENCOUNTER — Encounter: Payer: Self-pay | Admitting: Vascular Surgery

## 2020-07-21 ENCOUNTER — Ambulatory Visit: Payer: BC Managed Care – PPO | Admitting: Vascular Surgery

## 2020-07-21 VITALS — BP 133/88 | HR 84 | Temp 98.2°F | Resp 20 | Ht 67.0 in | Wt 160.0 lb

## 2020-07-21 DIAGNOSIS — I70213 Atherosclerosis of native arteries of extremities with intermittent claudication, bilateral legs: Secondary | ICD-10-CM | POA: Insufficient documentation

## 2020-07-21 DIAGNOSIS — Z95828 Presence of other vascular implants and grafts: Secondary | ICD-10-CM

## 2020-07-21 DIAGNOSIS — I739 Peripheral vascular disease, unspecified: Secondary | ICD-10-CM | POA: Insufficient documentation

## 2020-07-21 NOTE — Progress Notes (Signed)
[facility] [place]  ASSESSMENT / PLAN: [name] is a 60 y.o. female with peripheral arterial disease status post aortobifemoral bypass in 2014.  She is done remarkably well.  She has some occasional discomfort in her right second toe, which she only notices when she walks in excess of 6 miles.  Should continue walking as she can.  She was counseled extensively about stopping smoking.  She should continue aspirin and statin therapy.  She can follow-up with me in 3 years or as needed.  CHIEF COMPLAINT: Follow-up aortobifemoral bypass  HISTORY OF PRESENT ILLNESS: [name] is a 60 y.o. female presents to clinic for evaluation at the recommendation of her primary care doctor.  The patient underwent an aortobifemoral bypass graft 01/30/2013 with Dr. Kellie Simmering.  She did very well postoperatively.  She took his recommendations to heart and has been taking excellent care of herself.  Walks 6 to 8 miles a day.  She is trying to quit smoking but is finding it hard to do so.  She is compliant with aspirin and statin therapies.  Has noticed no hernia or discomfort in her incisional sites.  VASCULAR SURGICAL HISTORY:  Aortobifemoral bypass graft 01/30/13 with Dr. Kellie Simmering  Past Medical History:  Diagnosis Date  . Anxiety    takes Xanax prn   . Back pain    DDD  . Constipation   . Depression    takes Effexor daily  . Genital herpes   . GERD (gastroesophageal reflux disease)    takes Omeprazole daily  . Hemorrhoids   . History of bronchitis    last time 18months ago  . History of colon polyps   . History of migraine    last one 10yrs ago  . Hyperlipidemia    not on meds at present and Dr.McKenzie will review after surgery  . Insomnia   . Peripheral vascular disease ([place])   . Vitamin B12 deficiency   . Weakness    bil legs    Past Surgical History:  Procedure Laterality Date  . ANKLE SURGERY Left    with 3 screws and 2 pens  . AORTA - BILATERAL FEMORAL  ARTERY BYPASS GRAFT N/A 01/30/2013   Procedure: AORTA BIFEMORAL BYPASS GRAFT;  Surgeon: Mal Misty, MD;  Location: [facility];  Service: Vascular;  Laterality: N/A;  . CERVICAL CONE BIOPSY    . colonscopy    . ECTOPIC PREGNANCY SURGERY     x 2  . left fallopian tube removed    . wisdom teeth extracted       Family History  Problem Relation Age of Onset  . Hyperlipidemia Mother   . Breast cancer Other     Social History   Socioeconomic History  . Marital status: Married    Spouse name: Not on file  . Number of children: 1  . Years of education: Not on file  . Highest education level: Not on file  Occupational History    Employer: [employer]  Tobacco Use  . Smoking status: Current Every Day Smoker    Packs/day: 0.50    Years: 35.00    Pack years: 17.50    Types: Cigarettes  . Smokeless tobacco: Never Used  Substance and Sexual Activity  . Alcohol use: Yes    Comment: weekends only  . Drug use: No  . Sexual activity: Yes  Other Topics Concern  . Not on file  Social History Narrative   Lives at home  with husband.     Social Determinants of Health   Financial Resource Strain: Not on file  Food Insecurity: Not on file  Transportation Needs: Not on file  Physical Activity: Not on file  Stress: Not on file  Social Connections: Not on file  Intimate Partner Violence: Not on file    Allergies  Allergen Reactions  . Chantix [Varenicline]     Unknown    . Lipitor [Atorvastatin]     Muscle aches  . Septra [Sulfamethoxazole-Trimethoprim] Other (See Comments)    Unknown   . Simvastatin     Muscle cramps     Current Outpatient Medications  Medication Sig Dispense Refill  . ALPRAZolam (XANAX) 0.5 MG tablet Take 1 tablet (0.5 mg total) by mouth at bedtime as needed for sleep. 30 tablet 1  . aspirin 81 MG chewable tablet Chew 1 tablet (81 mg total) by mouth daily.    . Nutritional Supplements (BIFIDO-GENIC GROWTH FACTORS PO) Take 4 tablets by mouth  daily.    . rosuvastatin (CRESTOR) 20 MG tablet Take 1 tablet (20 mg total) by mouth daily. 90 tablet 1  . valACYclovir (VALTREX) 500 MG tablet TAKE 1 TABLET (500 MG TOTAL) BY MOUTH AS NEEDED. 30 tablet 0  . venlafaxine XR (EFFEXOR-XR) 75 MG 24 hr capsule TAKE 1 CAPSULE BY MOUTH DAILY WITH BREAKFAST. 90 capsule 0  . vitamin B-12 (CYANOCOBALAMIN) 1000 MCG tablet Take 1,000 mcg by mouth daily.     No current facility-administered medications for this visit.    REVIEW OF SYSTEMS:  [X]  denotes positive finding, [ ]  denotes negative finding Cardiac  Comments:  Chest pain or chest pressure:    Shortness of breath upon exertion:    Short of breath when lying flat:    Irregular heart rhythm:        Vascular    Pain in calf, thigh, or hip brought on by ambulation:    Pain in feet at night that wakes you up from your sleep:     Blood clot in your veins:    Leg swelling:         Pulmonary    Oxygen at home:    Productive cough:     Wheezing:         Neurologic    Sudden weakness in arms or legs:     Sudden numbness in arms or legs:     Sudden onset of difficulty speaking or slurred speech:    Temporary loss of vision in one eye:     Problems with dizziness:         Gastrointestinal    Blood in stool:     Vomited blood:         Genitourinary    Burning when urinating:     Blood in urine:        Psychiatric    Major depression:         Hematologic    Bleeding problems:    Problems with blood clotting too easily:        Skin    Rashes or ulcers:        Constitutional    Fever or chills:      PHYSICAL EXAM  Vitals:   07/21/20 1247  BP: 133/88  Pulse: 84  Resp: 20  Temp: 98.2 F (36.8 C)  SpO2: 99%  Weight: 160 lb (72.6 kg)  Height: 5\' 7"  (1.702 m)    Constitutional: well appearing in no distress. Appears well  nourished.  Neurologic: CN intact. no focal findings. no sensory loss. Psychiatric: Mood and affect symmetric and appropriate. Eyes: No icterus. No  conjunctival pallor. Ears, nose, throat: mucous membranes moist. Midline trachea.  Cardiac: regular rate and rhythm.  Respiratory: unlabored. Abdominal: soft, non-tender, non-distended. No midline hernia. Well healed incision. Peripheral vascular:  1+ Dps bilaterally Extremity: No edema. No cyanosis. No pallor.  Skin: No gangrene. No ulceration.  Lymphatic: No Stemmer's sign. No palpable lymphadenopathy.   PERTINENT LABORATORY AND RADIOLOGIC DATA  Most recent CBC CBC Latest Ref Rng & Units 01/31/2013 01/30/2013 01/25/2013  WBC 4.0 - 10.5 K/uL 13.9(H) 23.0(H) 10.5  Hemoglobin 12.0 - 15.0 g/dL 11.8(L) 11.8(L) 15.0  Hematocrit 36.0 - 46.0 % 35.4(L) 34.6(L) 42.2  Platelets 150 - 400 K/uL 219 227 288     Most recent CMP CMP Latest Ref Rng & Units 01/31/2013 01/30/2013 01/25/2013  Glucose 70 - 99 mg/dL 145(H) 153(H) 91  BUN 6 - 23 mg/dL 6 10 10   Creatinine 0.50 - 1.10 mg/dL 0.57 0.73 0.62  Sodium 135 - 145 mEq/L 134(L) 135 135  Potassium 3.5 - 5.1 mEq/L 3.8 4.4 4.0  Chloride 96 - 112 mEq/L 100 102 100  CO2 19 - 32 mEq/L 25 26 19   Calcium 8.4 - 10.5 mg/dL 8.2(L) 7.9(L) 9.7  Total Protein 6.0 - 8.3 g/dL 5.8(L) - 7.3  Total Bilirubin 0.3 - 1.2 mg/dL 0.3 - 0.2(L)  Alkaline Phos 39 - 117 U/L 77 - 110  AST 0 - 37 U/L 21 - 19  ALT 0 - 35 U/L 18 - 25    Renal function CrCl cannot be calculated (Patient's most recent lab result is older than the maximum 21 days allowed.).  No results found for: HGBA1C  LDL Cholesterol  Date Value Ref Range Status  01/30/2013 146 (H) 0 - 99 mg/dL Final    Comment:           Total Cholesterol/HDL:CHD Risk Coronary Heart Disease Risk Table                     Men   Women  1/2 Average Risk   3.4   3.3  Average Risk       5.0   4.4  2 X Average Risk   9.6   7.1  3 X Average Risk  23.4   11.0        Use the calculated Patient Ratio above and the CHD Risk Table to determine the patient's CHD Risk.        ATP III CLASSIFICATION (LDL):  <100     mg/dL    Optimal  100-129  mg/dL   Near or Above                    Optimal  130-159  mg/dL   Borderline  160-189  mg/dL   High  >190     mg/dL   Very High     Vascular Imaging: ABI 07/21/20    Yevonne Aline. Stanford Breed, MD [facility] Phone Number: [phone] 07/21/2020 10:02 AM

## 2020-08-03 ENCOUNTER — Other Ambulatory Visit: Payer: Self-pay

## 2020-08-04 ENCOUNTER — Encounter: Payer: Self-pay | Admitting: Internal Medicine

## 2020-08-04 ENCOUNTER — Ambulatory Visit (INDEPENDENT_AMBULATORY_CARE_PROVIDER_SITE_OTHER): Payer: BC Managed Care – PPO | Admitting: Internal Medicine

## 2020-08-04 VITALS — BP 110/70 | HR 67 | Temp 97.9°F | Ht 66.0 in | Wt 157.1 lb

## 2020-08-04 DIAGNOSIS — Z23 Encounter for immunization: Secondary | ICD-10-CM

## 2020-08-04 DIAGNOSIS — E785 Hyperlipidemia, unspecified: Secondary | ICD-10-CM

## 2020-08-04 DIAGNOSIS — I739 Peripheral vascular disease, unspecified: Secondary | ICD-10-CM | POA: Diagnosis not present

## 2020-08-04 DIAGNOSIS — F411 Generalized anxiety disorder: Secondary | ICD-10-CM | POA: Diagnosis not present

## 2020-08-04 DIAGNOSIS — Z0000 Encounter for general adult medical examination without abnormal findings: Secondary | ICD-10-CM | POA: Diagnosis not present

## 2020-08-04 LAB — COMPREHENSIVE METABOLIC PANEL
ALT: 18 U/L (ref 0–35)
AST: 19 U/L (ref 0–37)
Albumin: 4.6 g/dL (ref 3.5–5.2)
Alkaline Phosphatase: 91 U/L (ref 39–117)
BUN: 17 mg/dL (ref 6–23)
CO2: 28 mEq/L (ref 19–32)
Calcium: 9.6 mg/dL (ref 8.4–10.5)
Chloride: 104 mEq/L (ref 96–112)
Creatinine, Ser: 0.83 mg/dL (ref 0.40–1.20)
GFR: 77.18 mL/min (ref >60.00)
Glucose, Bld: 94 mg/dL (ref 70–99)
Potassium: 4.7 mEq/L (ref 3.5–5.1)
Sodium: 140 mEq/L (ref 135–145)
Total Bilirubin: 0.5 mg/dL (ref 0.2–1.2)
Total Protein: 6.9 g/dL (ref 6.0–8.3)

## 2020-08-04 LAB — LIPID PANEL
Cholesterol: 186 mg/dL (ref 0–200)
HDL: 58.2 mg/dL (ref >39.00)
LDL Cholesterol: 100 mg/dL — ABNORMAL HIGH (ref 0–99)
NonHDL: 127.65
Total CHOL/HDL Ratio: 3
Triglycerides: 140 mg/dL (ref 0.0–149.0)
VLDL: 28 mg/dL (ref 0.0–40.0)

## 2020-08-04 LAB — CBC WITH DIFFERENTIAL/PLATELET
Basophils Absolute: 0.1 10*3/uL (ref 0.0–0.1)
Basophils Relative: 0.9 % (ref 0.0–3.0)
Eosinophils Absolute: 0.1 10*3/uL (ref 0.0–0.7)
Eosinophils Relative: 1.1 % (ref 0.0–5.0)
HCT: 46 % (ref 36.0–46.0)
Hemoglobin: 15.5 g/dL — ABNORMAL HIGH (ref 12.0–15.0)
Lymphocytes Relative: 27.5 % (ref 12.0–46.0)
Lymphs Abs: 2 10*3/uL (ref 0.7–4.0)
MCHC: 33.7 g/dL (ref 30.0–36.0)
MCV: 92.5 fl (ref 78.0–100.0)
Monocytes Absolute: 0.7 10*3/uL (ref 0.1–1.0)
Monocytes Relative: 9.9 % (ref 3.0–12.0)
Neutro Abs: 4.3 10*3/uL (ref 1.4–7.7)
Neutrophils Relative %: 60.6 % (ref 43.0–77.0)
Platelets: 206 10*3/uL (ref 150.0–400.0)
RBC: 4.97 Mil/uL (ref 3.87–5.11)
RDW: 13.2 % (ref 11.5–15.5)
WBC: 7.2 10*3/uL (ref 4.0–10.5)

## 2020-08-04 LAB — VITAMIN B12: Vitamin B-12: 520 pg/mL (ref 211–911)

## 2020-08-04 LAB — TSH: TSH: 0.75 u[IU]/mL (ref 0.35–4.50)

## 2020-08-04 LAB — VITAMIN D 25 HYDROXY (VIT D DEFICIENCY, FRACTURES): VITD: 35.77 ng/mL (ref 30.00–100.00)

## 2020-08-04 LAB — HEMOGLOBIN A1C: Hgb A1c MFr Bld: 6 % (ref 4.6–6.5)

## 2020-08-04 NOTE — Patient Instructions (Addendum)
-Nice seeing you today!!  -Lab work today; will notify you once results are available.  -Tetanus and shingles vaccines today.  -Remember to schedule your colonoscopy with Dr. Collene Mares and your eye exam.  -Schedule follow up in 6 months.   Preventive Care 21-60 Years Old, Female Preventive care refers to lifestyle choices and visits with your health care provider that can promote health and wellness. This includes:  A yearly physical exam. This is also called an annual wellness visit.  Regular dental and eye exams.  Immunizations.  Screening for certain conditions.  Healthy lifestyle choices, such as: ? Eating a healthy diet. ? Getting regular exercise. ? Not using drugs or products that contain nicotine and tobacco. ? Limiting alcohol use. What can I expect for my preventive care visit? Physical exam Your health care provider will check your:  Height and weight. These may be used to calculate your BMI (body mass index). BMI is a measurement that tells if you are at a healthy weight.  Heart rate and blood pressure.  Body temperature.  Skin for abnormal spots. Counseling Your health care provider may ask you questions about your:  Past medical problems.  Family's medical history.  Alcohol, tobacco, and drug use.  Emotional well-being.  Home life and relationship well-being.  Sexual activity.  Diet, exercise, and sleep habits.  Work and work Statistician.  Access to firearms.  Method of birth control.  Menstrual cycle.  Pregnancy history. What immunizations do I need? Vaccines are usually given at various ages, according to a schedule. Your health care provider will recommend vaccines for you based on your age, medical history, and lifestyle or other factors, such as travel or where you work.   What tests do I need? Blood tests  Lipid and cholesterol levels. These may be checked every 5 years, or more often if you are over 24 years old.  Hepatitis C  test.  Hepatitis B test. Screening  Lung cancer screening. You may have this screening every year starting at age 32 if you have a 30-pack-year history of smoking and currently smoke or have quit within the past 15 years.  Colorectal cancer screening. ? All adults should have this screening starting at age 39 and continuing until age 18. ? Your health care provider may recommend screening at age 70 if you are at increased risk. ? You will have tests every 1-10 years, depending on your results and the type of screening test.  Diabetes screening. ? This is done by checking your blood sugar (glucose) after you have not eaten for a while (fasting). ? You may have this done every 1-3 years.  Mammogram. ? This may be done every 1-2 years. ? Talk with your health care provider about when you should start having regular mammograms. This may depend on whether you have a family history of breast cancer.  BRCA-related cancer screening. This may be done if you have a family history of breast, ovarian, tubal, or peritoneal cancers.  Pelvic exam and Pap test. ? This may be done every 3 years starting at age 90. ? Starting at age 33, this may be done every 5 years if you have a Pap test in combination with an HPV test. Other tests  STD (sexually transmitted disease) testing, if you are at risk.  Bone density scan. This is done to screen for osteoporosis. You may have this scan if you are at high risk for osteoporosis. Talk with your health care provider about your test  results, treatment options, and if necessary, the need for more tests. Follow these instructions at home: Eating and drinking  Eat a diet that includes fresh fruits and vegetables, whole grains, lean protein, and low-fat dairy products.  Take vitamin and mineral supplements as recommended by your health care provider.  Do not drink alcohol if: ? Your health care provider tells you not to drink. ? You are pregnant, may be  pregnant, or are planning to become pregnant.  If you drink alcohol: ? Limit how much you have to 0-1 drink a day. ? Be aware of how much alcohol is in your drink. In the [place] one drink equals one 12 oz bottle of beer (355 mL), one 5 oz glass of wine (148 mL), or one 1 oz glass of hard liquor (44 mL).   Lifestyle  Take daily care of your teeth and gums. Brush your teeth every morning and night with fluoride toothpaste. Floss one time each day.  Stay active. Exercise for at least 30 minutes 5 or more days each week.  Do not use any products that contain nicotine or tobacco, such as cigarettes, e-cigarettes, and chewing tobacco. If you need help quitting, ask your health care provider.  Do not use drugs.  If you are sexually active, practice safe sex. Use a condom or other form of protection to prevent STIs (sexually transmitted infections).  If you do not wish to become pregnant, use a form of birth control. If you plan to become pregnant, see your health care provider for a prepregnancy visit.  If told by your health care provider, take low-dose aspirin daily starting at age 20.  Find healthy ways to cope with stress, such as: ? Meditation, yoga, or listening to music. ? Journaling. ? Talking to a trusted person. ? Spending time with friends and family. Safety  Always wear your seat belt while driving or riding in a vehicle.  Do not drive: ? If you have been drinking alcohol. Do not ride with someone who has been drinking. ? When you are tired or distracted. ? While texting.  Wear a helmet and other protective equipment during sports activities.  If you have firearms in your house, make sure you follow all gun safety procedures. What's next?  Visit your health care provider once a year for an annual wellness visit.  Ask your health care provider how often you should have your eyes and teeth checked.  Stay up to date on all vaccines. This information is not intended to  replace advice given to you by your health care provider. Make sure you discuss any questions you have with your health care provider. Document Revised: 02/04/2020 Document Reviewed: 01/11/2018 Elsevier Patient Education  2021 [employer].

## 2020-08-04 NOTE — Addendum Note (Signed)
Addended by: Westley Hummer B on: 08/04/2020 01:36 PM   Modules accepted: Orders

## 2020-08-04 NOTE — Progress Notes (Signed)
Established Patient Office Visit     This visit occurred during the SARS-CoV-2 public health emergency.  Safety protocols were in place, including screening questions prior to the visit, additional usage of staff PPE, and extensive cleaning of exam room while observing appropriate contact time as indicated for disinfecting solutions.    CC/Reason for Visit: Annual preventive exam  HPI: [name] is a 60 y.o. female who is coming in today for the above mentioned reasons. Past Medical History is significant for: Generalized anxiety disorder hyperlipidemia and peripheral vascular disease with an aortobifemoral bypass in 2016.  She has no acute issues today.  She has routine dental care, is overdue for her eye exam.  She exercises daily by either walking or attending personal training sessions at the gym.  She last had a colonoscopy in 2019 and was supposed to be a 3-year callback.  She is overdue for shingles and tetanus vaccines but otherwise immunizations are up-to-date.  She had a mammogram in 2022.  Has an upcoming appointment in April with her GYN.   Past Medical/Surgical History: Past Medical History:  Diagnosis Date  . Anxiety    takes Xanax prn   . Back pain    DDD  . Constipation   . Depression    takes Effexor daily  . Genital herpes   . GERD (gastroesophageal reflux disease)    takes Omeprazole daily  . Hemorrhoids   . History of bronchitis    last time 53months ago  . History of colon polyps   . History of migraine    last one 77yrs ago  . Hyperlipidemia    not on meds at present and Dr.McKenzie will review after surgery  . Insomnia   . Peripheral vascular disease ([place])   . Vitamin B12 deficiency   . Weakness    bil legs    Past Surgical History:  Procedure Laterality Date  . ANKLE SURGERY Left    with 3 screws and 2 pens  . AORTA - BILATERAL FEMORAL ARTERY BYPASS GRAFT N/A 01/30/2013   Procedure: AORTA BIFEMORAL BYPASS GRAFT;  Surgeon: Mal Misty, MD;  Location: [facility];  Service: Vascular;  Laterality: N/A;  . CERVICAL CONE BIOPSY    . colonscopy    . ECTOPIC PREGNANCY SURGERY     x 2  . left fallopian tube removed    . wisdom teeth extracted       Social History:  reports that she has been smoking cigarettes. She has a 17.50 pack-year smoking history. She has never used smokeless tobacco. She reports current alcohol use. She reports that she does not use drugs.  Allergies: Allergies  Allergen Reactions  . Chantix [Varenicline]     Unknown    . Lipitor [Atorvastatin]     Muscle aches  . Septra [Sulfamethoxazole-Trimethoprim] Other (See Comments)    Unknown   . Simvastatin     Muscle cramps     Family History:  Family History  Problem Relation Age of Onset  . Hyperlipidemia Mother   . Breast cancer Other      Current Outpatient Medications:  .  ALPRAZolam (XANAX) 0.5 MG tablet, Take 1 tablet (0.5 mg total) by mouth at bedtime as needed for sleep., Disp: 30 tablet, Rfl: 1 .  aspirin 81 MG chewable tablet, Chew 1 tablet (81 mg total) by mouth daily., Disp: , Rfl:  .  Nutritional Supplements (BIFIDO-GENIC GROWTH FACTORS PO), Take 4 tablets by mouth daily., Disp: ,  Rfl:  .  rosuvastatin (CRESTOR) 20 MG tablet, Take 1 tablet (20 mg total) by mouth daily., Disp: 90 tablet, Rfl: 1 .  valACYclovir (VALTREX) 500 MG tablet, TAKE 1 TABLET (500 MG TOTAL) BY MOUTH AS NEEDED., Disp: 30 tablet, Rfl: 0 .  venlafaxine XR (EFFEXOR-XR) 75 MG 24 hr capsule, TAKE 1 CAPSULE BY MOUTH DAILY WITH BREAKFAST., Disp: 90 capsule, Rfl: 0 .  vitamin B-12 (CYANOCOBALAMIN) 1000 MCG tablet, Take 1,000 mcg by mouth daily. (Patient not taking: Reported on 08/04/2020), Disp: , Rfl:   Review of Systems:  Constitutional: Denies fever, chills, diaphoresis, appetite change and fatigue.  HEENT: Denies photophobia, eye pain, redness, hearing loss, ear pain, congestion, sore throat, rhinorrhea, sneezing, mouth sores, trouble swallowing, neck pain, neck  stiffness and tinnitus.   Respiratory: Denies SOB, DOE, cough, chest tightness,  and wheezing.   Cardiovascular: Denies chest pain, palpitations and leg swelling.  Gastrointestinal: Denies nausea, vomiting, abdominal pain, diarrhea, constipation, blood in stool and abdominal distention.  Genitourinary: Denies dysuria, urgency, frequency, hematuria, flank pain and difficulty urinating.  Endocrine: Denies: hot or cold intolerance, sweats, changes in hair or nails, polyuria, polydipsia. Musculoskeletal: Denies myalgias, back pain, joint swelling, arthralgias and gait problem.  Skin: Denies pallor, rash and wound.  Neurological: Denies dizziness, seizures, syncope, weakness, light-headedness, numbness and headaches.  Hematological: Denies adenopathy. Easy bruising, personal or family bleeding history  Psychiatric/Behavioral: Denies suicidal ideation, mood changes, confusion, nervousness, sleep disturbance and agitation    Physical Exam: Vitals:   08/04/20 0929  BP: 110/70  Pulse: 67  Temp: 97.9 F (36.6 C)  TempSrc: Oral  SpO2: 98%  Weight: 157 lb 1.6 oz (71.3 kg)  Height: $Remove'5\' 6"'jkdBlNU$  (1.676 m)    Body mass index is 25.36 kg/m.   Constitutional: NAD, calm, comfortable Eyes: PERRL, lids and conjunctivae normal ENMT: Mucous membranes are moist. Posterior pharynx clear of any exudate or lesions. Normal dentition. Tympanic membrane is pearly white, no erythema or bulging. Neck: normal, supple, no masses, no thyromegaly Respiratory: clear to auscultation bilaterally, no wheezing, no crackles. Normal respiratory effort. No accessory muscle use.  Cardiovascular: Regular rate and rhythm, no murmurs / rubs / gallops. No extremity edema. 2+ pedal pulses.  Abdomen: no tenderness, no masses palpated. No hepatosplenomegaly. Bowel sounds positive.  Musculoskeletal: no clubbing / cyanosis. No joint deformity upper and lower extremities. Good ROM, no contractures. Normal muscle tone.  Skin: no rashes,  lesions, ulcers. No induration Neurologic: CN 2-12 grossly intact. Sensation intact, DTR normal. Strength 5/5 in all 4.  Psychiatric: Normal judgment and insight. Alert and oriented x 3. Normal mood.    Impression and Plan:  Encounter for preventive health examination  -She has routine dental care, have advised routine eye care. -For shingles and tetanus vaccines today, otherwise immunizations are up-to-date including COVID. -Screening labs today. -Healthy lifestyle discussed in detail. -.  She had a negative mammogram in February 2022. -She had a colonoscopy in 2019 and was advised a 3-year callback due to numerous polyps.  She will call Dr. Lorie Apley office to reschedule. -She is overdue for Pap smear, she has an upcoming appointment with her GYN next month.  Peripheral vascular disease, unspecified ([place]) -Followed by vascular surgery, recent ABIs were excellent.  Hyperlipidemia, unspecified hyperlipidemia type  - Plan: Lipid panel -She is on rosuvastatin 20 mg daily.  GAD (generalized anxiety disorder) -On Effexor and as needed alprazolam.  Need for shingles vaccine -For shingles vaccine administered today.  Need for Tdap vaccination -Tdap administered today  Patient Instructions   -Nice seeing you today!!  -Lab work today; will notify you once results are available.  -Tetanus and shingles vaccines today.  -Remember to schedule your colonoscopy with Dr. Collene Mares and your eye exam.  -Schedule follow up in 6 months.   Preventive Care 45-40 Years Old, Female Preventive care refers to lifestyle choices and visits with your health care provider that can promote health and wellness. This includes:  A yearly physical exam. This is also called an annual wellness visit.  Regular dental and eye exams.  Immunizations.  Screening for certain conditions.  Healthy lifestyle choices, such as: ? Eating a healthy diet. ? Getting regular exercise. ? Not using drugs or products  that contain nicotine and tobacco. ? Limiting alcohol use. What can I expect for my preventive care visit? Physical exam Your health care provider will check your:  Height and weight. These may be used to calculate your BMI (body mass index). BMI is a measurement that tells if you are at a healthy weight.  Heart rate and blood pressure.  Body temperature.  Skin for abnormal spots. Counseling Your health care provider may ask you questions about your:  Past medical problems.  Family's medical history.  Alcohol, tobacco, and drug use.  Emotional well-being.  Home life and relationship well-being.  Sexual activity.  Diet, exercise, and sleep habits.  Work and work Statistician.  Access to firearms.  Method of birth control.  Menstrual cycle.  Pregnancy history. What immunizations do I need? Vaccines are usually given at various ages, according to a schedule. Your health care provider will recommend vaccines for you based on your age, medical history, and lifestyle or other factors, such as travel or where you work.   What tests do I need? Blood tests  Lipid and cholesterol levels. These may be checked every 5 years, or more often if you are over 58 years old.  Hepatitis C test.  Hepatitis B test. Screening  Lung cancer screening. You may have this screening every year starting at age 64 if you have a 30-pack-year history of smoking and currently smoke or have quit within the past 15 years.  Colorectal cancer screening. ? All adults should have this screening starting at age 101 and continuing until age 22. ? Your health care provider may recommend screening at age 37 if you are at increased risk. ? You will have tests every 1-10 years, depending on your results and the type of screening test.  Diabetes screening. ? This is done by checking your blood sugar (glucose) after you have not eaten for a while (fasting). ? You may have this done every 1-3  years.  Mammogram. ? This may be done every 1-2 years. ? Talk with your health care provider about when you should start having regular mammograms. This may depend on whether you have a family history of breast cancer.  BRCA-related cancer screening. This may be done if you have a family history of breast, ovarian, tubal, or peritoneal cancers.  Pelvic exam and Pap test. ? This may be done every 3 years starting at age 34. ? Starting at age 58, this may be done every 5 years if you have a Pap test in combination with an HPV test. Other tests  STD (sexually transmitted disease) testing, if you are at risk.  Bone density scan. This is done to screen for osteoporosis. You may have this scan if you are at high risk for osteoporosis. Talk with your health  care provider about your test results, treatment options, and if necessary, the need for more tests. Follow these instructions at home: Eating and drinking  Eat a diet that includes fresh fruits and vegetables, whole grains, lean protein, and low-fat dairy products.  Take vitamin and mineral supplements as recommended by your health care provider.  Do not drink alcohol if: ? Your health care provider tells you not to drink. ? You are pregnant, may be pregnant, or are planning to become pregnant.  If you drink alcohol: ? Limit how much you have to 0-1 drink a day. ? Be aware of how much alcohol is in your drink. In the [place] one drink equals one 12 oz bottle of beer (355 mL), one 5 oz glass of wine (148 mL), or one 1 oz glass of hard liquor (44 mL).   Lifestyle  Take daily care of your teeth and gums. Brush your teeth every morning and night with fluoride toothpaste. Floss one time each day.  Stay active. Exercise for at least 30 minutes 5 or more days each week.  Do not use any products that contain nicotine or tobacco, such as cigarettes, e-cigarettes, and chewing tobacco. If you need help quitting, ask your health care  provider.  Do not use drugs.  If you are sexually active, practice safe sex. Use a condom or other form of protection to prevent STIs (sexually transmitted infections).  If you do not wish to become pregnant, use a form of birth control. If you plan to become pregnant, see your health care provider for a prepregnancy visit.  If told by your health care provider, take low-dose aspirin daily starting at age 35.  Find healthy ways to cope with stress, such as: ? Meditation, yoga, or listening to music. ? Journaling. ? Talking to a trusted person. ? Spending time with friends and family. Safety  Always wear your seat belt while driving or riding in a vehicle.  Do not drive: ? If you have been drinking alcohol. Do not ride with someone who has been drinking. ? When you are tired or distracted. ? While texting.  Wear a helmet and other protective equipment during sports activities.  If you have firearms in your house, make sure you follow all gun safety procedures. What's next?  Visit your health care provider once a year for an annual wellness visit.  Ask your health care provider how often you should have your eyes and teeth checked.  Stay up to date on all vaccines. This information is not intended to replace advice given to you by your health care provider. Make sure you discuss any questions you have with your health care provider. Document Revised: 02/04/2020 Document Reviewed: 01/11/2018 Elsevier Patient Education  2021 [place], MD [facility]

## 2020-08-06 ENCOUNTER — Other Ambulatory Visit: Payer: Self-pay | Admitting: Internal Medicine

## 2020-08-06 DIAGNOSIS — B009 Herpesviral infection, unspecified: Secondary | ICD-10-CM

## 2020-08-20 ENCOUNTER — Ambulatory Visit: Payer: BC Managed Care – PPO | Admitting: Obstetrics and Gynecology

## 2020-08-20 ENCOUNTER — Encounter: Payer: Self-pay | Admitting: Obstetrics and Gynecology

## 2020-08-20 ENCOUNTER — Other Ambulatory Visit: Payer: Self-pay

## 2020-08-20 ENCOUNTER — Other Ambulatory Visit (HOSPITAL_COMMUNITY)
Admission: RE | Admit: 2020-08-20 | Discharge: 2020-08-20 | Disposition: A | Discharge status: Home / Self Care | Payer: BC Managed Care – PPO | Source: Ambulatory Visit | Attending: Obstetrics and Gynecology | Admitting: Obstetrics and Gynecology

## 2020-08-20 VITALS — BP 100/66 | HR 87 | Ht 66.0 in | Wt 158.0 lb

## 2020-08-20 DIAGNOSIS — Z01419 Encounter for gynecological examination (general) (routine) without abnormal findings: Secondary | ICD-10-CM | POA: Diagnosis present

## 2020-08-20 NOTE — Progress Notes (Signed)
60 y.o. G80P0011 Married Caucasian female here for annual exam.    Some vaginal dryness. Not bothersome.   Denies vaginal bleeding.  Received J and J Covid vaccine and booster.   Mother passed away from supranuclear palsy 2 years ago. Patient cared for her at home.   PCP: Lelon Frohlich, MD [facility]:  Jeneen Rinks, [place].  Patient's last menstrual period was 10/31/2010.           Sexually active: Yes.    The current method of family planning is post menopausal status.    Exercising: Yes.    personal trainer 4x/week, hiking Smoker:  Yes, smokes 1/2ppd  Health Maintenance: Pap: 15 years ago normal per patient History of abnormal Pap:  Yes, hx of conization 25 years ago--paps normal since MMG: 07-01-20 Neg/BiRads1 Colonoscopy:  11-10-17 9 polyps;next 3 years BMD: n/a  Result  n/a TDaP: 08-04-20 Gardasil:   no HIV: Neg in preg Hep C:unsure Screening Labs:  PCP.   reports that she has been smoking cigarettes. She has a 17.50 pack-year smoking history. She has never used smokeless tobacco. She reports current alcohol use of about 1.0 standard drink of alcohol per week. She reports that she does not use drugs.  Past Medical History:  Diagnosis Date  . Anxiety    takes Xanax prn   . Back pain    DDD  . Constipation   . Depression    takes Effexor daily  . Genital herpes   . GERD (gastroesophageal reflux disease)    takes Omeprazole daily  . Hemorrhoids   . History of abnormal cervical Pap smear    age 20--hx conization-paps normal since  . History of bronchitis    last time 73months ago  . History of colon polyps   . 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 HSV-1 infection   . Hyperlipidemia    not on meds at present and Dr.McKenzie will review after surgery  . Insomnia   . Peripheral vascular disease ([place])   . Vitamin B12 deficiency   . Weakness    bil legs    Past Surgical History:  Procedure Laterality Date  . ANKLE SURGERY Left     with 3 screws and 2 pens  . AORTA - BILATERAL FEMORAL ARTERY BYPASS GRAFT N/A 01/30/2013   Procedure: AORTA BIFEMORAL BYPASS GRAFT;  Surgeon: Mal Misty, MD;  Location: [facility] OR;  Service: Vascular;  Laterality: N/A;  . CERVICAL CONE BIOPSY     age 73  . colonscopy    . ECTOPIC PREGNANCY SURGERY     x 2  . left fallopian tube removed    . wisdom teeth extracted       Current Outpatient Medications  Medication Sig Dispense Refill  . ALPRAZolam (XANAX) 0.5 MG tablet Take 1 tablet (0.5 mg total) by mouth at bedtime as needed for sleep. 30 tablet 1  . aspirin 81 MG chewable tablet Chew 1 tablet (81 mg total) by mouth daily.    . Nutritional Supplements (BIFIDO-GENIC GROWTH FACTORS PO) Take 4 tablets by mouth daily.    . rosuvastatin (CRESTOR) 20 MG tablet Take 1 tablet (20 mg total) by mouth daily. 90 tablet 1  . valACYclovir (VALTREX) 500 MG tablet TAKE 1 TABLET (500 MG TOTAL) BY MOUTH AS NEEDED. 30 tablet 5  . venlafaxine XR (EFFEXOR-XR) 75 MG 24 hr capsule TAKE 1 CAPSULE BY MOUTH DAILY WITH BREAKFAST. 90 capsule 0  .  vitamin B-12 (CYANOCOBALAMIN) 1000 MCG tablet Take 1,000 mcg by mouth daily.     No current facility-administered medications for this visit.    Family History  Problem Relation Age of Onset  . Hyperlipidemia Mother   . Breast cancer Other   . Heart attack Maternal Grandfather   . Hypertension Paternal Grandmother     Review of Systems  All other systems reviewed and are negative.   Exam:   BP 100/66   Pulse 87   Ht 5\' 6"  (1.676 m)   Wt 158 lb (71.7 kg)   LMP 10/31/2010   SpO2 99%   BMI 25.50 kg/m     General appearance: alert, cooperative and appears stated age Head: normocephalic, without obvious abnormality, atraumatic Neck: no adenopathy, supple, symmetrical, trachea midline and thyroid normal to inspection and palpation Lungs: clear to auscultation bilaterally Breasts: consistent with bilateral breast reduction, no masses or tenderness, No nipple  retraction or dimpling, No nipple discharge or bleeding, No axillary adenopathy Heart: regular rate and rhythm Abdomen: vertical midline incision.  Abdomen is soft, non-tender; no masses, no organomegaly Extremities: extremities normal, atraumatic, no cyanosis or edema Skin: skin color, texture, turgor normal. No rashes or lesions Lymph nodes: cervical, supraclavicular, and axillary nodes normal. Neurologic: grossly normal  Pelvic: External genitalia:  no lesions              No abnormal inguinal nodes palpated.              Urethra:  normal appearing urethra with no masses, tenderness or lesions              Bartholins and Skenes: normal                 Vagina: normal appearing vagina with normal color and discharge, no lesions              Cervix: no lesions, consistent with conization.               Pap taken: Yes.   Bimanual Exam:  Uterus:  normal size, contour, position, consistency, mobility, non-tender              Adnexa: no mass, fullness, tenderness              Rectal exam: Yes.  .  Confirms.              Anus:  normal sphincter tone, no lesions  Chaperone was present for exam.  Assessment:   Well woman visit with normal exam. Hx conization for moderate dysplasia.  Hx ectopic pregnancy.  Status post left fallopian tube removal on pathology report 03/06/00. Status post bilateral breast reduction.  Hx breast cancer.  Maternal great grandmother.   Hx HSV 1. Status post aorto-bifem bypass.   Plan: Mammogram screening discussed. Self breast awareness reviewed. Pap and HR HPV as above. Guidelines for Calcium, Vitamin D, regular exercise program including cardiovascular and weight bearing exercise.  Follow up annually and prn.

## 2020-08-20 NOTE — Patient Instructions (Signed)

## 2020-08-21 LAB — CYTOLOGY - PAP
Adequacy: ABSENT
Comment: NEGATIVE
Diagnosis: NEGATIVE
High risk HPV: NEGATIVE

## 2020-09-16 ENCOUNTER — Other Ambulatory Visit: Payer: Self-pay | Admitting: Internal Medicine

## 2020-10-29 ENCOUNTER — Ambulatory Visit: Payer: BC Managed Care – PPO | Admitting: Internal Medicine

## 2020-10-29 ENCOUNTER — Other Ambulatory Visit: Payer: Self-pay

## 2020-10-29 VITALS — BP 124/70 | HR 74 | Temp 97.9°F | Wt 158.7 lb

## 2020-10-29 DIAGNOSIS — L559 Sunburn, unspecified: Secondary | ICD-10-CM

## 2020-10-29 DIAGNOSIS — K219 Gastro-esophageal reflux disease without esophagitis: Secondary | ICD-10-CM | POA: Diagnosis not present

## 2020-10-29 MED ORDER — PANTOPRAZOLE SODIUM 40 MG PO TBEC: 40.0000 mg | DELAYED_RELEASE_TABLET | Freq: Every day | ORAL | 1 refills | Status: DC

## 2020-10-29 NOTE — Progress Notes (Signed)
Acute office Visit     This visit occurred during the SARS-CoV-2 public health emergency.  Safety protocols were in place, including screening questions prior to the visit, additional usage of staff PPE, and extensive cleaning of exam room while observing appropriate contact time as indicated for disinfecting solutions.    CC/Reason for Visit: Itching of upper back, reflux symptoms  HPI: [name] is a 60 y.o. female who is coming in today for the above mentioned reasons.  She is here today to discuss 2 acute concerns:  1.  She went to the beach in April.  She admits to being a "sunburn".  She is not very consistent with sunscreen.  Ever since April she has been having itching of her upper back and shoulder area.  She thought it might be due to a switching laundry detergent which she has since eliminated without relief.  2.  For some time she has been having some stomach issues.  She feels a lot of reflux, heartburn, nausea, retching, sour taste in her mouth.  Past Medical/Surgical History: Past Medical History:  Diagnosis Date   Anxiety    takes Xanax prn    Back pain    DDD   Constipation    Depression    takes Effexor daily   Genital herpes    GERD (gastroesophageal reflux disease)    takes Omeprazole daily   Hemorrhoids    History of abnormal cervical Pap smear    age 16--hx conization-paps normal since   History of bronchitis    last time 41months ago   History of colon polyps    History of migraine    last one 52yrs ago--with aura   HSV-1 infection    Hyperlipidemia    not on meds at present and Dr.McKenzie will review after surgery   Insomnia    Peripheral vascular disease ([place])    Vitamin B12 deficiency    Weakness    bil legs    Past Surgical History:  Procedure Laterality Date   ANKLE SURGERY Left    with 3 screws and 2 pens   AORTA - BILATERAL FEMORAL ARTERY BYPASS GRAFT N/A 01/30/2013   Procedure: AORTA BIFEMORAL BYPASS GRAFT;  Surgeon:  Mal Misty, MD;  Location: [facility] OR;  Service: Vascular;  Laterality: N/A;   BREAST SURGERY  02/2020   breast reduction    CERVICAL CONE BIOPSY     age 60   colonscopy     ECTOPIC PREGNANCY SURGERY     x 2   left fallopian tube removed     wisdom teeth extracted       Social History:  reports that she has been smoking cigarettes. She has a 17.50 pack-year smoking history. She has never used smokeless tobacco. She reports current alcohol use of about 1.0 standard drink of alcohol per week. She reports that she does not use drugs.  Allergies: Allergies  Allergen Reactions   Chantix [Varenicline]     Unknown     Lipitor [Atorvastatin]     Muscle aches   Septra [Sulfamethoxazole-Trimethoprim] Other (See Comments)    Unknown    Simvastatin     Muscle cramps     Family History:  Family History  Problem Relation Age of Onset   Hyperlipidemia Mother    Breast cancer Other    Heart attack Maternal Grandfather    Hypertension Paternal Grandmother      Current Outpatient Medications:    ALPRAZolam Duanne Moron)  0.5 MG tablet, Take 1 tablet (0.5 mg total) by mouth at bedtime as needed for sleep., Disp: 30 tablet, Rfl: 1   aspirin 81 MG chewable tablet, Chew 1 tablet (81 mg total) by mouth daily., Disp: , Rfl:    Nutritional Supplements (BIFIDO-GENIC GROWTH FACTORS PO), Take 4 tablets by mouth daily., Disp: , Rfl:    pantoprazole (PROTONIX) 40 MG tablet, Take 1 tablet (40 mg total) by mouth daily., Disp: 90 tablet, Rfl: 1   rosuvastatin (CRESTOR) 20 MG tablet, Take 1 tablet (20 mg total) by mouth daily., Disp: 90 tablet, Rfl: 1   valACYclovir (VALTREX) 500 MG tablet, TAKE 1 TABLET (500 MG TOTAL) BY MOUTH AS NEEDED., Disp: 30 tablet, Rfl: 5   venlafaxine XR (EFFEXOR-XR) 75 MG 24 hr capsule, TAKE 1 CAPSULE BY MOUTH DAILY WITH BREAKFAST., Disp: 90 capsule, Rfl: 1   vitamin B-12 (CYANOCOBALAMIN) 1000 MCG tablet, Take 1,000 mcg by mouth daily., Disp: , Rfl:   Review of Systems:   Constitutional: Denies fever, chills, diaphoresis, appetite change and fatigue.  HEENT: Denies photophobia, eye pain, redness, hearing loss, ear pain, congestion, sore throat, rhinorrhea, sneezing, mouth sores, trouble swallowing, neck pain, neck stiffness and tinnitus.   Respiratory: Denies SOB, DOE, cough, chest tightness,  and wheezing.   Cardiovascular: Denies chest pain, palpitations and leg swelling.  Gastrointestinal: Denies  vomiting,  diarrhea, constipation, blood in stool and abdominal distention.  Genitourinary: Denies dysuria, urgency, frequency, hematuria, flank pain and difficulty urinating.  Endocrine: Denies: hot or cold intolerance, sweats, changes in hair or nails, polyuria, polydipsia. Musculoskeletal: Denies myalgias, back pain, joint swelling, arthralgias and gait problem.  Skin: Denies pallor, rash and wound.  Neurological: Denies dizziness, seizures, syncope, weakness, light-headedness, numbness and headaches.  Hematological: Denies adenopathy. Easy bruising, personal or family bleeding history  Psychiatric/Behavioral: Denies suicidal ideation, mood changes, confusion, nervousness, sleep disturbance and agitation    Physical Exam: Vitals:   10/29/20 0702  BP: 124/70  Pulse: 74  Temp: 97.9 F (36.6 C)  TempSrc: Oral  SpO2: 97%  Weight: 158 lb 11.2 oz (72 kg)    Body mass index is 25.61 kg/m.   Constitutional: NAD, calm, comfortable Eyes: PERRL, lids and conjunctivae normal ENMT: Mucous membranes are moist.  Skin: First-degree sunburn of her upper back and shoulders with darkened skin color. Neurologic: Grossly intact and nonfocal. Psychiatric: Normal judgment and insight. Alert and oriented x 3. Normal mood.    Impression and Plan:  Sunburn -Have advised she apply a cooling lotion with aloe vera. -Have advised daily SPF with a minimum of 50 and avoidance of sun exposure.  Gastroesophageal reflux disease without esophagitis  -All her symptoms are  likely dyspepsia related.  Since she has had some improvement with occasional Prilosec OTC, I will prescribe Protonix 40 mg daily for minimum of 12 weeks. -She knows to follow-up with me if symptoms recur.  Time spent: 31 minutes reviewing chart, interviewing and examining patient and formulating plan of care.     Lelon Frohlich, MD [facility]

## 2020-11-17 ENCOUNTER — Encounter: Payer: Self-pay | Admitting: Vascular Surgery

## 2020-11-17 ENCOUNTER — Ambulatory Visit (HOSPITAL_COMMUNITY)
Admission: RE | Admit: 2020-11-17 | Discharge: 2020-11-17 | Disposition: A | Discharge status: Home / Self Care | Payer: BC Managed Care – PPO | Source: Ambulatory Visit | Attending: Internal Medicine | Admitting: Internal Medicine

## 2020-11-17 ENCOUNTER — Other Ambulatory Visit (HOSPITAL_COMMUNITY): Payer: Self-pay | Admitting: Vascular Surgery

## 2020-11-17 ENCOUNTER — Other Ambulatory Visit: Payer: Self-pay

## 2020-11-17 ENCOUNTER — Ambulatory Visit: Payer: BC Managed Care – PPO | Admitting: Vascular Surgery

## 2020-11-17 VITALS — BP 136/94 | HR 78 | Temp 98.0°F | Resp 20 | Ht 66.0 in | Wt 158.0 lb

## 2020-11-17 DIAGNOSIS — M79604 Pain in right leg: Secondary | ICD-10-CM

## 2020-11-17 DIAGNOSIS — M79605 Pain in left leg: Secondary | ICD-10-CM | POA: Diagnosis present

## 2020-11-17 DIAGNOSIS — I739 Peripheral vascular disease, unspecified: Secondary | ICD-10-CM

## 2020-11-17 NOTE — Progress Notes (Signed)
[facility] [place]  ASSESSMENT / PLAN: [name] is a 60 y.o. female with peripheral arterial disease status post aortobifemoral bypass in 2014.   Patient counseled patients with asymptomatic peripheral arterial disease or claudication have a 1-2% risk of developing chronic limb threatening ischemia, but a 15-30% risk of mortality in the next 5 years. Intervention should only be considered for medically optimized patients with disabling symptoms.   Recommend the following which can slow the progression of atherosclerosis and reduce the risk of major adverse cardiac / limb events:  Complete cessation from all tobacco products. Blood glucose control with goal A1c < 7%. Blood pressure control with goal blood pressure < 140/90 mmHg. Lipid reduction therapy with goal LDL-C <100 mg/dL (<70 if symptomatic from PAD).  Aspirin 81mg  PO QD.  Atorvastatin 40-80mg  PO QD (or other "high intensity" statin therapy).  I do not peripheral arterial disease is causing her symptoms. Clinical exam reassuring. ABI today is slightly worse today on the right. Suspect orthopedic cause of symptoms. I counseled her that if she does not stop smoking, her atherosclerosis will progress. Follow up with me in 6 months. Instructed patient to follow up with PCP for further workup.  CHIEF COMPLAINT: Follow-up aortobifemoral bypass  HISTORY OF PRESENT ILLNESS: [name] is a 60 y.o. female presents to clinic for evaluation at the recommendation of her primary care doctor.  The patient underwent an aortobifemoral bypass graft 01/30/2013 with Dr. Kellie Simmering.  She did very well postoperatively.  She took his recommendations to heart and has been taking excellent care of herself.  Walks 6 to 8 miles a day.  She is trying to quit smoking but is finding it hard to do so.  She is compliant with aspirin and statin therapies.  Has noticed no hernia or discomfort in her incisional sites.  11/18/20:  Patient returns to clinic reporting right leg discomfort.  The pain radiates from her buttock down her leg.  Pain does not seem to be positional.  She has no exertional discomfort.  She does not have symptoms typical of intermittent claudication (no cramping when ambulating).  She is still walking 5 to 6 miles a day.  She still smokes, unfortunately.  No pain at rest.  No ulceration.  VASCULAR SURGICAL HISTORY:  Aortobifemoral bypass graft 01/30/13 with Dr. Kellie Simmering  Past Medical History:  Diagnosis Date   Anxiety    takes Xanax prn    Back pain    DDD   Constipation    Depression    takes Effexor daily   Genital herpes    GERD (gastroesophageal reflux disease)    takes Omeprazole daily   Hemorrhoids    History of abnormal cervical Pap smear    age 9--hx conization-paps normal since   History of bronchitis    last time 52months ago   History of colon polyps    History of migraine    last one 21yrs ago--with aura   HSV-1 infection    Hyperlipidemia    not on meds at present and Dr.McKenzie will review after surgery   Insomnia    Peripheral vascular disease ([place])    Vitamin B12 deficiency    Weakness    bil legs    Past Surgical History:  Procedure Laterality Date   ANKLE SURGERY Left    with 3 screws and 2 pens   AORTA - BILATERAL FEMORAL ARTERY BYPASS GRAFT N/A 01/30/2013   Procedure: AORTA BIFEMORAL BYPASS GRAFT;  Surgeon:  Mal Misty, MD;  Location: [facility] OR;  Service: Vascular;  Laterality: N/A;   BREAST SURGERY  02/2020   breast reduction    CERVICAL CONE BIOPSY     age 36   colonscopy     ECTOPIC PREGNANCY SURGERY     x 2   left fallopian tube removed     wisdom teeth extracted       Family History  Problem Relation Age of Onset   Hyperlipidemia Mother    Breast cancer Other    Heart attack Maternal Grandfather    Hypertension Paternal Grandmother     Social History   Socioeconomic History   Marital status: Married    Spouse name: Not on file   Number  of children: 1   Years of education: Not on file   Highest education level: Not on file  Occupational History    Employer: [employer]  Tobacco Use   Smoking status: Every Day    Packs/day: 0.50    Years: 35.00    Pack years: 17.50    Types: Cigarettes   Smokeless tobacco: Never  Vaping Use   Vaping Use: Never used  Substance and Sexual Activity   Alcohol use: Yes    Alcohol/week: 1.0 standard drink    Types: 1 Glasses of wine per week    Comment: weekends only   Drug use: No   Sexual activity: Yes    Birth control/protection: Post-menopausal  Other Topics Concern   Not on file  Social History Narrative   Lives at home with husband.     Social Determinants of Health   Financial Resource Strain: Not on file  Food Insecurity: Not on file  Transportation Needs: Not on file  Physical Activity: Not on file  Stress: Not on file  Social Connections: Not on file  Intimate Partner Violence: Not on file    Allergies  Allergen Reactions   Chantix [Varenicline]     Unknown     Lipitor [Atorvastatin]     Muscle aches   Septra [Sulfamethoxazole-Trimethoprim] Other (See Comments)    Unknown    Simvastatin     Muscle cramps     Current Outpatient Medications  Medication Sig Dispense Refill   ALPRAZolam (XANAX) 0.5 MG tablet Take 1 tablet (0.5 mg total) by mouth at bedtime as needed for sleep. 30 tablet 1   aspirin 81 MG chewable tablet Chew 1 tablet (81 mg total) by mouth daily.     Nutritional Supplements (BIFIDO-GENIC GROWTH FACTORS PO) Take 4 tablets by mouth daily.     pantoprazole (PROTONIX) 40 MG tablet Take 1 tablet (40 mg total) by mouth daily. 90 tablet 1   rosuvastatin (CRESTOR) 20 MG tablet Take 1 tablet (20 mg total) by mouth daily. 90 tablet 1   valACYclovir (VALTREX) 500 MG tablet TAKE 1 TABLET (500 MG TOTAL) BY MOUTH AS NEEDED. 30 tablet 5   venlafaxine XR (EFFEXOR-XR) 75 MG 24 hr capsule TAKE 1 CAPSULE BY MOUTH DAILY WITH BREAKFAST. 90  capsule 1   vitamin B-12 (CYANOCOBALAMIN) 1000 MCG tablet Take 1,000 mcg by mouth daily.     No current facility-administered medications for this visit.    REVIEW OF SYSTEMS:  [X]  denotes positive finding, [ ]  denotes negative finding Cardiac  Comments:  Chest pain or chest pressure:    Shortness of breath upon exertion:    Short of breath when lying flat:    Irregular heart rhythm:  Vascular    Pain in calf, thigh, or hip brought on by ambulation:    Pain in feet at night that wakes you up from your sleep:     Blood clot in your veins:    Leg swelling:         Pulmonary    Oxygen at home:    Productive cough:     Wheezing:         Neurologic    Sudden weakness in arms or legs:     Sudden numbness in arms or legs:     Sudden onset of difficulty speaking or slurred speech:    Temporary loss of vision in one eye:     Problems with dizziness:         Gastrointestinal    Blood in stool:     Vomited blood:         Genitourinary    Burning when urinating:     Blood in urine:        Psychiatric    Major depression:         Hematologic    Bleeding problems:    Problems with blood clotting too easily:        Skin    Rashes or ulcers:        Constitutional    Fever or chills:      PHYSICAL EXAM  Vitals:   11/17/20 0942  BP: (!) 136/94  Pulse: 78  Resp: 20  Temp: 98 F (36.7 C)  SpO2: 98%  Weight: 158 lb (71.7 kg)  Height: 5\' 6"  (1.676 m)     Constitutional: well appearing in no distress. Appears well nourished.  Neurologic: CN intact. no focal findings. no sensory loss. Psychiatric: Mood and affect symmetric and appropriate. Eyes: No icterus. No conjunctival pallor. Ears, nose, throat: mucous membranes moist. Midline trachea.  Cardiac: regular rate and rhythm.  Respiratory: unlabored. Abdominal: soft, non-tender, non-distended. No midline hernia. Well healed incision. Peripheral vascular:  2+ femoral pulse bilaterally  2+ popliteal pulse  bilaterally  1+ Dps bilaterally Extremity: No edema. No cyanosis. No pallor.  Skin: No gangrene. No ulceration.  Lymphatic: No Stemmer's sign. No palpable lymphadenopathy.   PERTINENT LABORATORY AND RADIOLOGIC DATA  Most recent CBC CBC Latest Ref Rng & Units 08/04/2020 01/31/2013 01/30/2013  WBC 4.0 - 10.5 K/uL 7.2 13.9(H) 23.0(H)  Hemoglobin 12.0 - 15.0 g/dL 15.5(H) 11.8(L) 11.8(L)  Hematocrit 36.0 - 46.0 % 46.0 35.4(L) 34.6(L)  Platelets 150.0 - 400.0 K/uL 206.0 219 227     Most recent CMP CMP Latest Ref Rng & Units 08/04/2020 01/31/2013 01/30/2013  Glucose 70 - 99 mg/dL 94 145(H) 153(H)  BUN 6 - 23 mg/dL 17 6 10   Creatinine 0.40 - 1.20 mg/dL 0.83 0.57 0.73  Sodium 135 - 145 mEq/L 140 134(L) 135  Potassium 3.5 - 5.1 mEq/L 4.7 3.8 4.4  Chloride 96 - 112 mEq/L 104 100 102  CO2 19 - 32 mEq/L 28 25 26   Calcium 8.4 - 10.5 mg/dL 9.6 8.2(L) 7.9(L)  Total Protein 6.0 - 8.3 g/dL 6.9 5.8(L) -  Total Bilirubin 0.2 - 1.2 mg/dL 0.5 0.3 -  Alkaline Phos 39 - 117 U/L 91 77 -  AST 0 - 37 U/L 19 21 -  ALT 0 - 35 U/L 18 18 -    Renal function CrCl cannot be calculated (Patient's most recent lab result is older than the maximum 21 days allowed.).  Hgb A1c MFr Bld (%)  Date Value  08/04/2020 6.0    LDL Cholesterol  Date Value Ref Range Status  08/04/2020 100 (H) 0 - 99 mg/dL Final     Vascular Imaging: ABI 11/17/20    Yevonne Aline. Stanford Breed, MD [facility] Phone Number: [phone] 11/17/2020 8:19 AM

## 2020-11-19 ENCOUNTER — Telehealth: Payer: Self-pay | Admitting: Internal Medicine

## 2020-11-19 NOTE — Telephone Encounter (Signed)
Pt call and stated she have bad side effect with this medication pantoprazole she stated she have hip and joint pain.pt want a call back to tell her what to do.

## 2020-11-20 NOTE — Telephone Encounter (Signed)
Patient is aware.  Patient will stop taking Protonix for awhile and will let us know if her symptoms improve.

## 2021-01-19 ENCOUNTER — Ambulatory Visit (INDEPENDENT_AMBULATORY_CARE_PROVIDER_SITE_OTHER): Payer: BC Managed Care – PPO | Admitting: Plastic Surgery

## 2021-01-19 ENCOUNTER — Encounter: Payer: Self-pay | Admitting: Plastic Surgery

## 2021-01-19 ENCOUNTER — Other Ambulatory Visit: Payer: Self-pay

## 2021-01-19 DIAGNOSIS — N62 Hypertrophy of breast: Secondary | ICD-10-CM

## 2021-01-19 NOTE — Progress Notes (Signed)
   Subjective:    Patient ID: [name], female    DOB: December 29, 1960, 60 y.o.   MRN: [id number]  The patient is a 60 year old female here for a 1 year follow-up after undergoing a bilateral breast reduction.  She is happy with her results and doing well.  She noticed a little bit of asymmetry with the left breast being slightly lower than the right mostly this is noticed when she bends over.  She fits well and 1 bra and feels very comfortable.  There are no concerning lumps or bumps.     Review of Systems  Constitutional: Negative.   Eyes: Negative.   Respiratory: Negative.    Cardiovascular: Negative.   Gastrointestinal: Negative.   Endocrine: Negative.   Genitourinary: Negative.   Neurological: Negative.   Hematological: Negative.   Psychiatric/Behavioral: Negative.        Objective:   Physical Exam Vitals reviewed.  Constitutional:      Appearance: Normal appearance.  Cardiovascular:     Rate and Rhythm: Normal rate.     Pulses: Normal pulses.  Pulmonary:     Effort: Pulmonary effort is normal.  Musculoskeletal:        General: No swelling or deformity.  Skin:    General: Skin is warm.     Capillary Refill: Capillary refill takes less than 2 seconds.     Coloration: Skin is not jaundiced.     Findings: No bruising.  Neurological:     Mental Status: She is alert and oriented to person, place, and time. Mental status is at baseline.  Psychiatric:        Mood and Affect: Mood normal.        Behavior: Behavior normal.        Thought Content: Thought content normal.       Assessment & Plan:     ICD-10-CM   1. Symptomatic mammary hypertrophy  N62       Pictures were obtained of the patient and placed in the chart with the patient's or guardian's permission. We discussed options for modifying the left breast.  At this point in time she is content and would like to leave it as it is.  I think that that is smart especially if she is feeling very comfortable in  her close.  I remain available as needed.

## 2021-03-15 ENCOUNTER — Other Ambulatory Visit: Payer: Self-pay | Admitting: Internal Medicine

## 2021-04-20 ENCOUNTER — Ambulatory Visit: Payer: BC Managed Care – PPO | Admitting: Internal Medicine

## 2021-04-20 VITALS — BP 102/68 | HR 81 | Temp 98.1°F | Wt 156.7 lb

## 2021-04-20 DIAGNOSIS — M76892 Other specified enthesopathies of left lower limb, excluding foot: Secondary | ICD-10-CM

## 2021-04-20 DIAGNOSIS — Z23 Encounter for immunization: Secondary | ICD-10-CM | POA: Diagnosis not present

## 2021-04-20 NOTE — Progress Notes (Signed)
Acute office Visit     This visit occurred during the SARS-CoV-2 public health emergency.  Safety protocols were in place, including screening questions prior to the visit, additional usage of staff PPE, and extensive cleaning of exam room while observing appropriate contact time as indicated for disinfecting solutions.    CC/Reason for Visit: Left anterior hip pain  HPI: [name] is a 60 y.o. female who is coming in today for the above mentioned reasons.  For about 7 days she has been experiencing pain in her left groin, about a week prior to that she had that area while moving some things around in her garage.  She has tried rest, ice, elevation and is some better but not fully.  She is concerned because she has had bypass in that area.  She is also requesting a flu vaccine.  Past Medical/Surgical History: Past Medical History:  Diagnosis Date   Anxiety    takes Xanax prn    Back pain    DDD   Constipation    Depression    takes Effexor daily   Genital herpes    GERD (gastroesophageal reflux disease)    takes Omeprazole daily   Hemorrhoids    History of abnormal cervical Pap smear    age 77--hx conization-paps normal since   History of bronchitis    last time 68months ago   History of colon polyps    History of migraine    last one 70yrs ago--with aura   HSV-1 infection    Hyperlipidemia    not on meds at present and Dr.McKenzie will review after surgery   Insomnia    Peripheral vascular disease ([place])    Vitamin B12 deficiency    Weakness    bil legs    Past Surgical History:  Procedure Laterality Date   ANKLE SURGERY Left    with 3 screws and 2 pens   AORTA - BILATERAL FEMORAL ARTERY BYPASS GRAFT N/A 01/30/2013   Procedure: AORTA BIFEMORAL BYPASS GRAFT;  Surgeon: Mal Misty, MD;  Location: [facility] OR;  Service: Vascular;  Laterality: N/A;   BREAST SURGERY  02/2020   breast reduction    CERVICAL CONE BIOPSY     age 77   colonscopy     ECTOPIC  PREGNANCY SURGERY     x 2   left fallopian tube removed     wisdom teeth extracted       Social History:  reports that she has been smoking cigarettes. She has a 17.50 pack-year smoking history. She has never used smokeless tobacco. She reports current alcohol use of about 1.0 standard drink per week. She reports that she does not use drugs.  Allergies: Allergies  Allergen Reactions   Chantix [Varenicline]     Unknown     Lipitor [Atorvastatin]     Muscle aches   Septra [Sulfamethoxazole-Trimethoprim] Other (See Comments)    Unknown    Simvastatin     Muscle cramps     Family History:  Family History  Problem Relation Age of Onset   Hyperlipidemia Mother    Breast cancer Other    Heart attack Maternal Grandfather    Hypertension Paternal Grandmother      Current Outpatient Medications:    ALPRAZolam (XANAX) 0.5 MG tablet, Take 1 tablet (0.5 mg total) by mouth at bedtime as needed for sleep., Disp: 30 tablet, Rfl: 1   aspirin 81 MG chewable tablet, Chew 1 tablet (81 mg total) by  mouth daily., Disp: , Rfl:    Nutritional Supplements (BIFIDO-GENIC GROWTH FACTORS PO), Take 4 tablets by mouth daily., Disp: , Rfl:    pantoprazole (PROTONIX) 40 MG tablet, Take 1 tablet (40 mg total) by mouth daily., Disp: 90 tablet, Rfl: 1   rosuvastatin (CRESTOR) 20 MG tablet, Take 1 tablet (20 mg total) by mouth daily., Disp: 90 tablet, Rfl: 1   valACYclovir (VALTREX) 500 MG tablet, TAKE 1 TABLET (500 MG TOTAL) BY MOUTH AS NEEDED., Disp: 30 tablet, Rfl: 5   venlafaxine XR (EFFEXOR-XR) 75 MG 24 hr capsule, TAKE 1 CAPSULE BY MOUTH DAILY WITH BREAKFAST., Disp: 90 capsule, Rfl: 0  Review of Systems:  Constitutional: Denies fever, chills, diaphoresis, appetite change and fatigue.  HEENT: Denies photophobia, eye pain, redness, hearing loss, ear pain, congestion, sore throat, rhinorrhea, sneezing, mouth sores, trouble swallowing, neck pain, neck stiffness and tinnitus.   Respiratory: Denies SOB, DOE,  cough, chest tightness,  and wheezing.   Cardiovascular: Denies chest pain, palpitations and leg swelling.  Gastrointestinal: Denies nausea, vomiting, abdominal pain, diarrhea, constipation, blood in stool and abdominal distention.  Genitourinary: Denies dysuria, urgency, frequency, hematuria, flank pain and difficulty urinating.  Endocrine: Denies: hot or cold intolerance, sweats, changes in hair or nails, polyuria, polydipsia. Musculoskeletal: Denies myalgias, back pain. Skin: Denies pallor, rash and wound.  Neurological: Denies dizziness, seizures, syncope, weakness, light-headedness, numbness and headaches.  Hematological: Denies adenopathy. Easy bruising, personal or family bleeding history  Psychiatric/Behavioral: Denies suicidal ideation, mood changes, confusion, nervousness, sleep disturbance and agitation    Physical Exam: Vitals:   04/20/21 1005  BP: 102/68  Pulse: 81  Temp: 98.1 F (36.7 C)  TempSrc: Oral  SpO2: 98%  Weight: 156 lb 11.2 oz (71.1 kg)    Body mass index is 25.29 kg/m.   Constitutional: NAD, calm, comfortable Eyes: PERRL, lids and conjunctivae normal ENMT: Mucous membranes are moist.   Musculoskeletal: When leg is placed in a frog position she has exquisite pain to palpation of her left anterior hip tendons.  Neurologic: Grossly intact and nonfocal Psychiatric: Normal judgment and insight. Alert and oriented x 3. Normal mood.    Impression and Plan:  Hip tendinitis, left  - Plan: Ambulatory referral to Sports Medicine -Suspect left anterior hip tendinitis, may need to rule out labral tear.  We will send to sports medicine for further evaluation. -In the meantime have advised continued icing, as needed NSAIDs and have given some stretches/exercises that she may do.  Needs flu shot  - Plan: Flu Vaccine QUAD 6+ mos PF IM (Fluarix Quad PF)  Time spent: 32 minutes reviewing chart, interviewing and examining patient and formulating plan of  care.    Lelon Frohlich, MD [facility]

## 2021-04-21 NOTE — Progress Notes (Signed)
Subjective:    CC: L hip pain  I, [name], LAT, ATC, am serving as scribe for Dr. Lynne Leader.  HPI: Pt is a 60 y/o female presenting w/ c/o L ant hip/groin pain x approximately one week that began after she was moving things in her garage.  Of note, she has a hx of B femoral bypass in this same area.  She locates her pain specifically to her L groin.  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  Her goals are to return to exercise as fast as possible.  Swelling: no Radiating pain: intermittently yes but not currently L hip mechanical symptoms: no Aggravating factors: L hip aBd; full L hip flexion; releasing from full L hip extension Treatments tried: rest, ice, Voltaren gel; Aspirin  Pertinent review of Systems: No fevers or chills  Relevant historical information: Peripheral vascular disease status post aorta femoral artery bypass Still smokes about a half a pack a day.  Objective:    Vitals:   04/22/21 0802  BP: 110/78  Pulse: 77  SpO2: 96%   General: Well Developed, well nourished, and in no acute distress.   MSK: Left hip: Normal-appearing Tender palpation medial groin and hip adductor's.  Mildly tender palpation at greater trochanter. Negative FABER test and FADIR test. Range of motion slightly limited to internal rotation and external rotation but not painful. Pain with resisted hip adduction and resisted hip flexion. Strength intact to abduction and external rotation flexion and adduction.  Most dominant with resisted hip adduction.  Pulses intact distally.    Lab and Radiology Results  X-ray images left hip obtained today personally and independently interpreted Mild hip DJD.  Slight cam type deformity humeral head concerning for FAI.  Superior portion of greater trochanter shows some enthesopathic changes.  No acute fractures. Sclerosis at pubic symphysis present. Await formal radiology review    Impression  and Recommendations:    Assessment and Plan: 60 y.o. female with left medial hip pain thought to be due to hip adductor strain.  She does have some evidence of lack of hip range of motion which may be some hip impingement or hip DJD related.  However dominant source of pain today is thought to be hip adductor strain.  She is a great candidate for physical therapy.  Plan to refer to PT and check back in about 8 weeks or sooner if needed.  PDMP not reviewed this encounter. Orders Placed This Encounter  Procedures   DG HIP UNILAT W OR W/O PELVIS 2-3 VIEWS LEFT    Standing Status:   Future    Standing Expiration Date:   05/23/2021    Order Specific Question:   Reason for Exam (SYMPTOM  OR DIAGNOSIS REQUIRED)    Answer:   L hip pain    Order Specific Question:   Is patient pregnant?    Answer:   No    Order Specific Question:   Preferred imaging location?    Answer:   [facility]   Ambulatory referral to Physical Therapy    Referral Priority:   Routine    Referral Type:   Physical Medicine    Referral Reason:   Specialty Services Required    Requested Specialty:   Physical Therapy    Number of Visits Requested:   1   No orders of the defined types were placed in this encounter.   Discussed warning signs or symptoms. Please  see discharge instructions. Patient expresses understanding.   The above documentation has been reviewed and is accurate and complete Lynne Leader, M.D.

## 2021-04-22 ENCOUNTER — Other Ambulatory Visit: Payer: Self-pay

## 2021-04-22 ENCOUNTER — Ambulatory Visit: Payer: BC Managed Care – PPO | Admitting: Family Medicine

## 2021-04-22 ENCOUNTER — Encounter: Payer: Self-pay | Admitting: Family Medicine

## 2021-04-22 ENCOUNTER — Ambulatory Visit (INDEPENDENT_AMBULATORY_CARE_PROVIDER_SITE_OTHER): Payer: BC Managed Care – PPO

## 2021-04-22 VITALS — BP 110/78 | HR 77 | Ht 66.0 in | Wt 155.8 lb

## 2021-04-22 DIAGNOSIS — R1032 Left lower quadrant pain: Secondary | ICD-10-CM

## 2021-04-22 DIAGNOSIS — M25552 Pain in left hip: Secondary | ICD-10-CM | POA: Diagnosis not present

## 2021-04-22 DIAGNOSIS — F17200 Nicotine dependence, unspecified, uncomplicated: Secondary | ICD-10-CM | POA: Diagnosis not present

## 2021-04-22 NOTE — Progress Notes (Signed)
Left hip x-ray looks pretty normal to radiology.

## 2021-04-22 NOTE — Patient Instructions (Addendum)
Nice to meet you today.  I've referred you to Physical Therapy.  Please let us know if you haven't heard from them in the next week regarding scheduling.  Please get an Xray today before you leave.  Follow-up: 8 weeks

## 2021-04-25 DIAGNOSIS — U071 COVID-19: Secondary | ICD-10-CM

## 2021-04-25 HISTORY — DX: COVID-19: U07.1

## 2021-04-29 ENCOUNTER — Telehealth (INDEPENDENT_AMBULATORY_CARE_PROVIDER_SITE_OTHER): Payer: BC Managed Care – PPO | Admitting: Internal Medicine

## 2021-04-29 ENCOUNTER — Encounter: Payer: Self-pay | Admitting: Internal Medicine

## 2021-04-29 ENCOUNTER — Other Ambulatory Visit: Payer: Self-pay

## 2021-04-29 VITALS — Temp 97.8°F | Wt 151.0 lb

## 2021-04-29 DIAGNOSIS — F411 Generalized anxiety disorder: Secondary | ICD-10-CM

## 2021-04-29 DIAGNOSIS — U071 COVID-19: Secondary | ICD-10-CM | POA: Diagnosis not present

## 2021-04-29 MED ORDER — MOLNUPIRAVIR EUA 200MG CAPSULE: 4.0000 | ORAL_CAPSULE | Freq: Two times a day (BID) | ORAL | 0 refills | Status: AC

## 2021-04-29 MED ORDER — ALPRAZOLAM 0.5 MG PO TABS: 0.5000 mg | ORAL_TABLET | Freq: Every evening | ORAL | 1 refills | Status: DC | PRN

## 2021-04-29 NOTE — Progress Notes (Signed)
Virtual Visit via Telephone Note  I connected with [name] on 04/29/21 at  2:30 PM EST by telephone and verified that I am speaking with the correct person using two identifiers.   I discussed the limitations, risks, security and privacy concerns of performing an evaluation and management service by telephone and the availability of in person appointments. I also discussed with the patient that there may be a patient responsible charge related to this service. The patient expressed understanding and agreed to proceed.  Location patient: home Location provider: work office Participants present for the call: patient, provider Patient did not have a visit in the prior 7 days to address this/these issue(s).   History of Present Illness:  Called to inform that she tested positive for COVID.  On Saturday she started having a headache, her husband had a fever that evening and he tested positive for COVID but she was negative that day.  On Sunday she retested due to progressive headache and diarrhea and was again negative.  On Monday morning she describes a pounding headache, congestion, runny nose, cough and fever she did test positive on Monday which was 4 days ago.  She feels overall better than she did upon symptom onset but feels like her symptoms are being slow to resolve.  She has anorexia and very little appetite.   Observations/Objective: Patient sounds congested on the phone. I do not appreciate any increased work of breathing. Speech and thought processing are grossly intact. Patient reported vitals: None reported   Current Outpatient Medications:    ALPRAZolam (XANAX) 0.5 MG tablet, Take 1 tablet (0.5 mg total) by mouth at bedtime as needed for sleep., Disp: 30 tablet, Rfl: 1   aspirin 81 MG chewable tablet, Chew 1 tablet (81 mg total) by mouth daily., Disp: , Rfl:    molnupiravir EUA (LAGEVRIO) 200 mg CAPS capsule, Take 4 capsules (800 mg total) by mouth 2 (two) times  daily for 5 days., Disp: 40 capsule, Rfl: 0   Nutritional Supplements (BIFIDO-GENIC GROWTH FACTORS PO), Take 4 tablets by mouth daily., Disp: , Rfl:    pantoprazole (PROTONIX) 40 MG tablet, Take 1 tablet (40 mg total) by mouth daily., Disp: 90 tablet, Rfl: 1   rosuvastatin (CRESTOR) 20 MG tablet, Take 1 tablet (20 mg total) by mouth daily., Disp: 90 tablet, Rfl: 1   valACYclovir (VALTREX) 500 MG tablet, TAKE 1 TABLET (500 MG TOTAL) BY MOUTH AS NEEDED., Disp: 30 tablet, Rfl: 5   venlafaxine XR (EFFEXOR-XR) 75 MG 24 hr capsule, TAKE 1 CAPSULE BY MOUTH DAILY WITH BREAKFAST., Disp: 90 capsule, Rfl: 0  Review of Systems:  Constitutional: Denies diaphoresis. HEENT: Denies photophobia, eye pain, redness,  mouth sores, trouble swallowing, neck pain, neck stiffness and tinnitus.   Respiratory: Denies SOB, DOE,  chest tightness,  and wheezing.   Cardiovascular: Denies chest pain, palpitations and leg swelling.  Gastrointestinal: Denies nausea, vomiting, abdominal pain, diarrhea, constipation, blood in stool and abdominal distention.  Genitourinary: Denies dysuria, urgency, frequency, hematuria, flank pain and difficulty urinating.  Endocrine: Denies: hot or cold intolerance, sweats, changes in hair or nails, polyuria, polydipsia. Musculoskeletal: Denies myalgias, back pain, joint swelling, arthralgias and gait problem.  Skin: Denies pallor, rash and wound.  Neurological: Denies dizziness, seizures, syncope, weakness, light-headedness, numbness and headaches.  Hematological: Denies adenopathy. Easy bruising, personal or family bleeding history  Psychiatric/Behavioral: Denies suicidal ideation, mood changes, confusion, nervousness, sleep disturbance and agitation   Assessment and Plan:  COVID-19  -  Plan: molnupiravir EUA (LAGEVRIO) 200 mg CAPS capsule BID x 5 days. -She may also use OTC medications such as antihistamines, decongestants, pain relievers, guaifenesin. -We have reviewed quarantine period  of 5 days. -We have discussed symptoms that would promote ED evaluation. -She knows to follow with [place] if symptoms fail to resolve.     I discussed the assessment and treatment plan with the patient. The patient was provided an opportunity to ask questions and all were answered. The patient agreed with the plan and demonstrated an understanding of the instructions.   The patient was advised to call back or seek an in-person evaluation if the symptoms worsen or if the condition fails to improve as anticipated.  I provided 15 minutes of non-face-to-face time during this encounter.   Lelon Frohlich, MD [facility]

## 2021-05-07 ENCOUNTER — Ambulatory Visit: Payer: BC Managed Care – PPO | Admitting: Rehabilitative and Restorative Service Providers"

## 2021-05-16 DIAGNOSIS — C801 Malignant (primary) neoplasm, unspecified: Secondary | ICD-10-CM

## 2021-05-16 HISTORY — DX: Malignant (primary) neoplasm, unspecified: C80.1

## 2021-06-09 ENCOUNTER — Other Ambulatory Visit: Payer: Self-pay | Admitting: Internal Medicine

## 2021-06-09 DIAGNOSIS — Z1231 Encounter for screening mammogram for malignant neoplasm of breast: Secondary | ICD-10-CM

## 2021-06-17 ENCOUNTER — Ambulatory Visit: Payer: BC Managed Care – PPO | Admitting: Family Medicine

## 2021-07-05 ENCOUNTER — Ambulatory Visit
Admission: RE | Admit: 2021-07-05 | Discharge: 2021-07-05 | Disposition: A | Discharge status: Home / Self Care | Payer: BC Managed Care – PPO | Source: Ambulatory Visit

## 2021-07-05 DIAGNOSIS — Z1231 Encounter for screening mammogram for malignant neoplasm of breast: Secondary | ICD-10-CM

## 2021-07-06 ENCOUNTER — Other Ambulatory Visit: Payer: Self-pay | Admitting: Internal Medicine

## 2021-07-06 DIAGNOSIS — R928 Other abnormal and inconclusive findings on diagnostic imaging of breast: Secondary | ICD-10-CM

## 2021-07-07 ENCOUNTER — Encounter: Payer: Self-pay | Admitting: Family Medicine

## 2021-07-07 ENCOUNTER — Telehealth (INDEPENDENT_AMBULATORY_CARE_PROVIDER_SITE_OTHER): Payer: BC Managed Care – PPO | Admitting: Family Medicine

## 2021-07-07 DIAGNOSIS — J069 Acute upper respiratory infection, unspecified: Secondary | ICD-10-CM | POA: Diagnosis not present

## 2021-07-07 DIAGNOSIS — R0982 Postnasal drip: Secondary | ICD-10-CM

## 2021-07-07 DIAGNOSIS — H6983 Other specified disorders of Eustachian tube, bilateral: Secondary | ICD-10-CM | POA: Diagnosis not present

## 2021-07-07 MED ORDER — FLUTICASONE PROPIONATE 50 MCG/ACT NA SUSP: 1.0000 | Freq: Every day | NASAL | 0 refills | Status: DC

## 2021-07-07 MED ORDER — CETIRIZINE HCL 10 MG PO TABS: 10.0000 mg | ORAL_TABLET | Freq: Every day | ORAL | 0 refills | Status: DC

## 2021-07-07 MED ORDER — BENZONATATE 100 MG PO CAPS: 100.0000 mg | ORAL_CAPSULE | Freq: Two times a day (BID) | ORAL | 0 refills | Status: DC | PRN

## 2021-07-07 NOTE — Progress Notes (Signed)
Virtual Visit via Video Note  I connected with [name] on 07/07/21 at  3:30 PM EST by a video enabled telemedicine application 2/2 [id number] pandemic and verified that I am speaking with the correct person using two identifiers.  Location patient: home Location provider:work or home office Persons participating in the virtual visit: patient, provider  I discussed the limitations of evaluation and management by telemedicine and the availability of in person appointments. The patient expressed understanding and agreed to proceed.  Chief Complaint  Patient presents with   Cough    Bad, barking cough, sneezing, headache and runny nose. More sneezing and fatigue than anything, all symptoms going on for 6 days. Husband Dx with bronchitis on Sunday, has gotten better, but pt has not. Has been drinking Mucinex severe cong and cough, the day and night, ginger cinnamon, clove and honey helps with cough but not getting rid of it. Martin Majestic out of town last [place], had a fever blister before, took the valtrex    HPI: Pt with a "barking cough" x 1 wk that is getting worse.  Also endorses sneezing, ST, rhinorrhea, HA, and ears are popping. Face seems puffy underneath R eye, might be tender when pressing on R cheek.  Having difficulty sleeping and post nasal drainage. Denies fever, wheezing.   Tried Mucinex severe cough and congestion.  Cloves, ginger, cinnamon tea with honey seems to help.  Pt's husband recently dx'd with Bronchitis.  COVID testing has been negative.  ROS: See pertinent positives and negatives per HPI.  Past Medical History:  Diagnosis Date   Anxiety    takes Xanax prn    Back pain    DDD   Constipation    Depression    takes Effexor daily   Genital herpes    GERD (gastroesophageal reflux disease)    takes Omeprazole daily   Hemorrhoids    History of abnormal cervical Pap smear    age 60--hx conization-paps normal since   History of bronchitis    last time 84months ago    History of colon polyps    History of migraine    last one 80yrs ago--with aura   HSV-1 infection    Hyperlipidemia    not on meds at present and Dr.McKenzie will review after surgery   Insomnia    Peripheral vascular disease ([place])    Vitamin B12 deficiency    Weakness    bil legs    Past Surgical History:  Procedure Laterality Date   ANKLE SURGERY Left    with 3 screws and 2 pens   AORTA - BILATERAL FEMORAL ARTERY BYPASS GRAFT N/A 01/30/2013   Procedure: AORTA BIFEMORAL BYPASS GRAFT;  Surgeon: Mal Misty, MD;  Location: [facility] OR;  Service: Vascular;  Laterality: N/A;   BREAST SURGERY  02/2020   breast reduction    CERVICAL CONE BIOPSY     age 32   colonscopy     ECTOPIC PREGNANCY SURGERY     x 2   left fallopian tube removed     wisdom teeth extracted       Family History  Problem Relation Age of Onset   Hyperlipidemia Mother    Breast cancer Other    Heart attack Maternal Grandfather    Hypertension Paternal Grandmother     Current Outpatient Medications:    ALPRAZolam (XANAX) 0.5 MG tablet, Take 1 tablet (0.5 mg total) by mouth at bedtime as needed for sleep., Disp: 30 tablet, Rfl: 1   aspirin  81 MG chewable tablet, Chew 1 tablet (81 mg total) by mouth daily., Disp: , Rfl:    Nutritional Supplements (BIFIDO-GENIC GROWTH FACTORS PO), Take 4 tablets by mouth daily., Disp: , Rfl:    pantoprazole (PROTONIX) 40 MG tablet, Take 1 tablet (40 mg total) by mouth daily., Disp: 90 tablet, Rfl: 1   rosuvastatin (CRESTOR) 20 MG tablet, Take 1 tablet (20 mg total) by mouth daily., Disp: 90 tablet, Rfl: 1   valACYclovir (VALTREX) 500 MG tablet, TAKE 1 TABLET (500 MG TOTAL) BY MOUTH AS NEEDED., Disp: 30 tablet, Rfl: 5   venlafaxine XR (EFFEXOR-XR) 75 MG 24 hr capsule, TAKE 1 CAPSULE BY MOUTH DAILY WITH BREAKFAST., Disp: 90 capsule, Rfl: 0  EXAM:  VITALS per patient if applicable: RR between 49-82 bpm  GENERAL: alert, oriented, appears well and in no acute distress  HEENT:  atraumatic, conjunctiva clear, no obvious abnormalities on inspection of external nose and ears  NECK: normal movements of the head and neck  LUNGS: on inspection no signs of respiratory distress, breathing rate appears normal, no obvious gross SOB, gasping or wheezing  CV: no obvious cyanosis  MS: moves all visible extremities without noticeable abnormality  PSYCH/NEURO: pleasant and cooperative, no obvious depression or anxiety, speech and thought processing grossly intact  ASSESSMENT AND PLAN:  Discussed the following assessment and plan:  Viral URI with cough  -Home COVID testing negative thus far. -Symptoms likely 2/2 acute viral illness with allergies contributing -Discussed treatment of symptoms with OTC antihistamines, cough and cold medications, saline nasal rinse, gargling with warm salt water or Chloraseptic spray, hydration, warm fluids, rest, etc. -Will start Flonase and Zyrtec.  Rx sent to pharmacy -Tessalon for cough -Patient advised to contact clinic for increased pressure in face, worsening cough, etc. - Plan: benzonatate (TESSALON) 100 MG capsule, fluticasone (FLONASE) 50 MCG/ACT nasal spray, cetirizine (ZYRTEC) 10 MG tablet  Post-nasal drainage  - Plan: fluticasone (FLONASE) 50 MCG/ACT nasal spray, cetirizine (ZYRTEC) 10 MG tablet  Dysfunction of both eustachian tubes  - Plan: fluticasone (FLONASE) 50 MCG/ACT nasal spray, cetirizine (ZYRTEC) 10 MG tablet  Follow-up as needed for continued or worsening symptoms   I discussed the assessment and treatment plan with the patient. The patient was provided an opportunity to ask questions and all were answered. The patient agreed with the plan and demonstrated an understanding of the instructions.   The patient was advised to call back or seek an in-person evaluation if the symptoms worsen or if the condition fails to improve as anticipated.   Billie Ruddy, MD

## 2021-07-12 ENCOUNTER — Encounter: Payer: Self-pay | Admitting: Internal Medicine

## 2021-07-14 ENCOUNTER — Encounter: Payer: Self-pay | Admitting: Internal Medicine

## 2021-07-14 ENCOUNTER — Other Ambulatory Visit: Payer: Self-pay

## 2021-07-14 ENCOUNTER — Ambulatory Visit (INDEPENDENT_AMBULATORY_CARE_PROVIDER_SITE_OTHER): Payer: BC Managed Care – PPO

## 2021-07-14 ENCOUNTER — Ambulatory Visit: Payer: BC Managed Care – PPO | Admitting: Internal Medicine

## 2021-07-14 VITALS — BP 120/84 | HR 94 | Temp 98.2°F | Wt 158.2 lb

## 2021-07-14 DIAGNOSIS — Z8719 Personal history of other diseases of the digestive system: Secondary | ICD-10-CM

## 2021-07-14 DIAGNOSIS — R0781 Pleurodynia: Secondary | ICD-10-CM

## 2021-07-14 DIAGNOSIS — R051 Acute cough: Secondary | ICD-10-CM | POA: Diagnosis not present

## 2021-07-14 HISTORY — DX: Personal history of other diseases of the digestive system: Z87.19

## 2021-07-14 NOTE — Progress Notes (Signed)
? ? ? ?Acute office Visit ? ? ? ? ?This visit occurred during the SARS-CoV-2 public health emergency.  Safety protocols were in place, including screening questions prior to the visit, additional usage of staff PPE, and extensive cleaning of exam room while observing appropriate contact time as indicated for disinfecting solutions.  ? ? ?CC/Reason for Visit: Left rib cage pain ? ?HPI: [name] is a 61 y.o. female who is coming in today for the above mentioned reasons.  For the past 3 weeks she has been dealing with an apparent upper respiratory infection with significant coughing.  Cough, in her words has been exasperating.  It is extremely severe.  She had a virtual visit with another provider and was given Tessalon, Flonase.  She later developed a temperature and went to urgent care was diagnosed with possibly bacterial bronchitis and given a Z-Pak and an inhaler.  For the past 3 days she has noticed significant left-sided lower rib cage pain. ? ?Past Medical/Surgical History: ?Past Medical History:  ?Diagnosis Date  ? Anxiety   ? takes Xanax prn   ? Back pain   ? DDD  ? Constipation   ? Depression   ? takes Effexor daily  ? Genital herpes   ? GERD (gastroesophageal reflux disease)   ? takes Omeprazole daily  ? Hemorrhoids   ? History of abnormal cervical Pap smear   ? age 12--hx conization-paps normal since  ? History of bronchitis   ? last time 90months ago  ? History of colon polyps   ? History of migraine   ? last one 49yrs ago--with aura  ? HSV-1 infection   ? Hyperlipidemia   ? not on meds at present and Dr.McKenzie will review after surgery  ? Insomnia   ? Peripheral vascular disease ([place])   ? Vitamin B12 deficiency   ? Weakness   ? bil legs  ? ? ?Past Surgical History:  ?Procedure Laterality Date  ? ANKLE SURGERY Left   ? with 3 screws and 2 pens  ? AORTA - BILATERAL FEMORAL ARTERY BYPASS GRAFT N/A 01/30/2013  ? Procedure: AORTA BIFEMORAL BYPASS GRAFT;  Surgeon: Mal Misty, MD;  Location: [place];  Service: Vascular;  Laterality: N/A;  ? BREAST SURGERY  02/2020  ? breast reduction   ? CERVICAL CONE BIOPSY    ? age 77  ? colonscopy    ? ECTOPIC PREGNANCY SURGERY    ? x 2  ? left fallopian tube removed    ? wisdom teeth extracted     ? ? ?Social History: ? reports that she has been smoking cigarettes. She has a 17.50 pack-year smoking history. She has never used smokeless tobacco. She reports current alcohol use of about 1.0 standard drink per week. She reports that she does not use drugs. ? ?Allergies: ?Allergies  ?Allergen Reactions  ? Chantix [Varenicline]   ?  Unknown  ?  ? Lipitor [Atorvastatin]   ?  Muscle aches  ? Septra [Sulfamethoxazole-Trimethoprim] Other (See Comments)  ?  Unknown   ? Simvastatin   ?  Muscle cramps   ? ? ?Family History:  ?Family History  ?Problem Relation Age of Onset  ? Hyperlipidemia Mother   ? Breast cancer Other   ? Heart attack Maternal Grandfather   ? Hypertension Paternal Grandmother   ? ? ? ?Current Outpatient Medications:  ?  albuterol (VENTOLIN HFA) 108 (90 Base) MCG/ACT inhaler, albuterol sulfate HFA 90 mcg/actuation aerosol inhaler  INHALE 2 PUFF(S)  EVERY 4 HOURS AS NEEDED., Disp: , Rfl:  ?  ALPRAZolam (XANAX) 0.5 MG tablet, Take 1 tablet (0.5 mg total) by mouth at bedtime as needed for sleep., Disp: 30 tablet, Rfl: 1 ?  aspirin 81 MG chewable tablet, Chew 1 tablet (81 mg total) by mouth daily., Disp: , Rfl:  ?  benzonatate (TESSALON) 100 MG capsule, Take 1 capsule (100 mg total) by mouth 2 (two) times daily as needed for cough., Disp: 20 capsule, Rfl: 0 ?  cetirizine (ZYRTEC) 10 MG tablet, Take 1 tablet (10 mg total) by mouth daily., Disp: 30 tablet, Rfl: 0 ?  fluticasone (FLONASE) 50 MCG/ACT nasal spray, Place 1 spray into both nostrils daily., Disp: 16 g, Rfl: 0 ?  Nutritional Supplements (BIFIDO-GENIC GROWTH FACTORS PO), Take 4 tablets by mouth daily., Disp: , Rfl:  ?  pantoprazole (PROTONIX) 40 MG tablet, Take 1 tablet (40 mg total) by mouth daily., Disp: 90  tablet, Rfl: 1 ?  rosuvastatin (CRESTOR) 20 MG tablet, Take 1 tablet (20 mg total) by mouth daily., Disp: 90 tablet, Rfl: 1 ?  valACYclovir (VALTREX) 500 MG tablet, TAKE 1 TABLET (500 MG TOTAL) BY MOUTH AS NEEDED., Disp: 30 tablet, Rfl: 5 ?  venlafaxine XR (EFFEXOR-XR) 75 MG 24 hr capsule, TAKE 1 CAPSULE BY MOUTH DAILY WITH BREAKFAST., Disp: 90 capsule, Rfl: 0 ?  azithromycin (ZITHROMAX) 250 MG tablet, azithromycin 250 mg tablet  TAKE 2 TABLETS (500 MG) BY ORAL ROUTE ONCE DAILY FOR 1 DAY THEN 1 TABLET (250 MG) BY ORAL ROUTE ONCE DAILY FOR 4 DAYS, Disp: , Rfl:  ? ?Review of Systems:  ?Constitutional: Denies fever, chills, diaphoresis.  ?HEENT: Denies photophobia, eye pain, redness,  trouble swallowing, neck pain, neck stiffness and tinnitus.   ?Respiratory: Denies SOB, DOE, chest tightness,  and wheezing.   ?Cardiovascular: Denies chest pain, palpitations and leg swelling.  ?Gastrointestinal: Denies nausea, vomiting, abdominal pain, diarrhea, constipation, blood in stool and abdominal distention.  ?Genitourinary: Denies dysuria, urgency, frequency, hematuria, flank pain and difficulty urinating.  ?Endocrine: Denies: hot or cold intolerance, sweats, changes in hair or nails, polyuria, polydipsia. ?Musculoskeletal: Denies myalgias, back pain, joint swelling, arthralgias and gait problem.  ?Skin: Denies pallor, rash and wound.  ?Neurological: Denies dizziness, seizures, syncope, weakness, light-headedness, numbness and headaches.  ?Hematological: Denies adenopathy. Easy bruising, personal or family bleeding history  ?Psychiatric/Behavioral: Denies suicidal ideation, mood changes, confusion, nervousness, sleep disturbance and agitation ? ? ? ?Physical Exam: ?Vitals:  ? 07/14/21 0856  ?BP: 120/84  ?Pulse: 94  ?Temp: 98.2 ?F (36.8 ?C)  ?TempSrc: Oral  ?SpO2: 98%  ?Weight: 158 lb 3.2 oz (71.8 kg)  ? ? ?Body mass index is 25.53 kg/m?. ? ? ?Constitutional: NAD, calm, comfortable ?Eyes: PERRL, lids and conjunctivae  normal ?ENMT: Mucous membranes are moist.  ?Respiratory: Left basilar crackles, left rib cage pain with deep inspiration, point tenderness to left lower rib cage, normal respiratory effort. No accessory muscle use.  ?Cardiovascular: Regular rate and rhythm, no murmurs / rubs / gallops. No extremity edema.Psychiatric: Normal judgment and insight. Alert and oriented x 3. Normal mood.  ? ? ?Impression and Plan: ? ?Acute cough - Plan: DG Chest 2 View ? ?Rib pain on left side - Plan: DG Chest 2 View ? ?-I suspect probably left lower rib cage fracture from severe coughing, however given left basilar crackles also wonder about pneumonia. ?-Chest x-ray to be done today with further work-up to follow. ? ?Time spent: 22 minutes reviewing chart, interviewing and examining patient and  formulating plan of care. ? ? ? ? ?Lelon Frohlich, MD ?[facility] ? ? ?

## 2021-07-15 ENCOUNTER — Encounter: Payer: Self-pay | Admitting: Internal Medicine

## 2021-07-16 ENCOUNTER — Encounter (HOSPITAL_BASED_OUTPATIENT_CLINIC_OR_DEPARTMENT_OTHER): Payer: Self-pay | Admitting: Emergency Medicine

## 2021-07-16 ENCOUNTER — Emergency Department (HOSPITAL_BASED_OUTPATIENT_CLINIC_OR_DEPARTMENT_OTHER): Payer: BC Managed Care – PPO

## 2021-07-16 ENCOUNTER — Emergency Department (HOSPITAL_BASED_OUTPATIENT_CLINIC_OR_DEPARTMENT_OTHER)
Admission: EM | Admit: 2021-07-16 | Discharge: 2021-07-16 | Disposition: A | Discharge status: Home / Self Care | Payer: BC Managed Care – PPO | Attending: Emergency Medicine | Admitting: Emergency Medicine

## 2021-07-16 ENCOUNTER — Other Ambulatory Visit: Payer: Self-pay

## 2021-07-16 DIAGNOSIS — Z7982 Long term (current) use of aspirin: Secondary | ICD-10-CM | POA: Insufficient documentation

## 2021-07-16 DIAGNOSIS — R079 Chest pain, unspecified: Secondary | ICD-10-CM | POA: Diagnosis present

## 2021-07-16 DIAGNOSIS — R0789 Other chest pain: Secondary | ICD-10-CM | POA: Diagnosis not present

## 2021-07-16 DIAGNOSIS — J449 Chronic obstructive pulmonary disease, unspecified: Secondary | ICD-10-CM | POA: Diagnosis not present

## 2021-07-16 DIAGNOSIS — Z87891 Personal history of nicotine dependence: Secondary | ICD-10-CM | POA: Diagnosis not present

## 2021-07-16 DIAGNOSIS — R0602 Shortness of breath: Secondary | ICD-10-CM | POA: Insufficient documentation

## 2021-07-16 LAB — CBC WITH DIFFERENTIAL/PLATELET
Abs Immature Granulocytes: 0.03 10*3/uL (ref 0.00–0.07)
Basophils Absolute: 0 10*3/uL (ref 0.0–0.1)
Basophils Relative: 1 %
Eosinophils Absolute: 0.2 10*3/uL (ref 0.0–0.5)
Eosinophils Relative: 3 %
HCT: 44.3 % (ref 36.0–46.0)
Hemoglobin: 14.5 g/dL (ref 12.0–15.0)
Immature Granulocytes: 0 %
Lymphocytes Relative: 34 %
Lymphs Abs: 2.3 10*3/uL (ref 0.7–4.0)
MCH: 30.2 pg (ref 26.0–34.0)
MCHC: 32.7 g/dL (ref 30.0–36.0)
MCV: 92.3 fL (ref 80.0–100.0)
Monocytes Absolute: 0.5 10*3/uL (ref 0.1–1.0)
Monocytes Relative: 8 %
Neutro Abs: 3.7 10*3/uL (ref 1.7–7.7)
Neutrophils Relative %: 54 %
Platelets: 229 10*3/uL (ref 150–400)
RBC: 4.8 MIL/uL (ref 3.87–5.11)
RDW: 12.1 % (ref 11.5–15.5)
WBC: 6.8 10*3/uL (ref 4.0–10.5)
nRBC: 0 % (ref 0.0–0.2)

## 2021-07-16 LAB — BASIC METABOLIC PANEL
Anion gap: 5 (ref 5–15)
BUN: 15 mg/dL (ref 6–20)
CO2: 26 mmol/L (ref 22–32)
Calcium: 9.5 mg/dL (ref 8.9–10.3)
Chloride: 109 mmol/L (ref 98–111)
Creatinine, Ser: 0.77 mg/dL (ref 0.44–1.00)
GFR, Estimated: >60 mL/min (ref >60)
Glucose, Bld: 84 mg/dL (ref 70–99)
Potassium: 4.4 mmol/L (ref 3.5–5.1)
Sodium: 140 mmol/L (ref 135–145)

## 2021-07-16 MED ORDER — IOHEXOL 350 MG/ML SOLN
100.0000 mL | Freq: Once | INTRAVENOUS | Status: AC | PRN
  Administered 2021-07-16: 61 mL via INTRAVENOUS

## 2021-07-16 NOTE — ED Notes (Signed)
Patient verbalizes understanding of discharge instructions. Opportunity for questioning and answers were provided. Patient discharged from ED.  °

## 2021-07-16 NOTE — ED Provider Notes (Signed)
?[facility] ?Provider Note ? ? ?CSN: [id number] ?Arrival date & time: 07/16/21  8676 ? ?  ? ?History ? ?Chief Complaint  ?Patient presents with  ? Shortness of Breath  ? ? ?[name] is a 61 y.o. female. ? ?Patient presents to ER chief complaint of left lower chest pain.  Is been painful for the past week or so.  She had a cough for about 3 weeks.  No longer having any fevers but still has a persistent cough.  She saw her primary care doctor had x-rays done 2 days ago which were unremarkable.  She presents to the ER today for persistent pain in that location.  Pain is only present during coughing or Valsalva episodes. ? ? ?  ? ?Home Medications ?Prior to Admission medications   ?Medication Sig Start Date End Date Taking? Authorizing Provider  ?albuterol (VENTOLIN HFA) 108 (90 Base) MCG/ACT inhaler albuterol sulfate HFA 90 mcg/actuation aerosol inhaler ? INHALE 2 PUFF(S) EVERY 4 HOURS AS NEEDED. 07/09/21   [provider]  ?ALPRAZolam Duanne Moron) 0.5 MG tablet Take 1 tablet (0.5 mg total) by mouth at bedtime as needed for sleep. 04/29/21   Isaac Bliss, Rayford Halsted, MD  ?aspirin 81 MG chewable tablet Chew 1 tablet (81 mg total) by mouth daily. 02/05/13   Richrd Prime, PA-C  ?azithromycin (ZITHROMAX) 250 MG tablet azithromycin 250 mg tablet ? TAKE 2 TABLETS (500 MG) BY ORAL ROUTE ONCE DAILY FOR 1 DAY THEN 1 TABLET (250 MG) BY ORAL ROUTE ONCE DAILY FOR 4 DAYS    [provider]  ?benzonatate (TESSALON) 100 MG capsule Take 1 capsule (100 mg total) by mouth 2 (two) times daily as needed for cough. 07/07/21   Billie Ruddy, MD  ?cetirizine (ZYRTEC) 10 MG tablet Take 1 tablet (10 mg total) by mouth daily. 07/07/21   Billie Ruddy, MD  ?fluticasone (FLONASE) 50 MCG/ACT nasal spray Place 1 spray into both nostrils daily. 07/07/21   Billie Ruddy, MD  ?Nutritional Supplements (BIFIDO-GENIC GROWTH FACTORS PO) Take 4 tablets by mouth daily.    [provider]  ?pantoprazole (PROTONIX) 40 MG tablet Take 1 tablet (40 mg total) by mouth daily. 10/29/20   Isaac Bliss, Rayford Halsted, MD  ?rosuvastatin (CRESTOR) 20 MG tablet Take 1 tablet (20 mg total) by mouth daily. 06/11/20   Isaac Bliss, Rayford Halsted, MD  ?valACYclovir (VALTREX) 500 MG tablet TAKE 1 TABLET (500 MG TOTAL) BY MOUTH AS NEEDED. 08/06/20   Isaac Bliss, Rayford Halsted, MD  ?venlafaxine XR (EFFEXOR-XR) 75 MG 24 hr capsule TAKE 1 CAPSULE BY MOUTH DAILY WITH BREAKFAST. 07/06/21   Isaac Bliss, Rayford Halsted, MD  ?   ? ?Allergies    ?Chantix [varenicline], Lipitor [atorvastatin], Septra [sulfamethoxazole-trimethoprim], and Simvastatin   ? ?Review of Systems   ?Review of Systems  ?Constitutional:  Negative for fever.  ?HENT:  Negative for ear pain.   ?Eyes:  Negative for pain.  ?Respiratory:  Negative for cough.   ?Cardiovascular:  Positive for chest pain.  ?Gastrointestinal:  Negative for abdominal pain.  ?Genitourinary:  Negative for flank pain.  ?Musculoskeletal:  Negative for back pain.  ?Skin:  Negative for rash.  ?Neurological:  Negative for headaches.  ? ?Physical Exam ?Updated Vital Signs ?BP (!) 142/81   Pulse (!) 59   Temp 97.8 ?F (36.6 ?C) (Oral)   Resp (!) 22   LMP 10/31/2010   SpO2 100%  ?Physical Exam ?Constitutional:   ?  General: She is not in acute distress. ?   Appearance: Normal appearance.  ?HENT:  ?   Head: Normocephalic.  ?   Nose: Nose normal.  ?Eyes:  ?   Extraocular Movements: Extraocular movements intact.  ?Cardiovascular:  ?   Rate and Rhythm: Normal rate.  ?Pulmonary:  ?   Effort: Pulmonary effort is normal.  ?Musculoskeletal:     ?   General: Normal range of motion.  ?   Cervical back: Normal range of motion.  ?   Comments: Left lower chest wall tenderness palpation reproduces her pain.  ?Neurological:  ?   General: No focal deficit present.  ?   Mental Status: She is alert. Mental status is at baseline.  ? ? ?ED Results / Procedures / Treatments   ?Labs ?(all labs ordered are listed,  but only abnormal results are displayed) ?Labs Reviewed  ?CBC WITH DIFFERENTIAL/PLATELET  ?BASIC METABOLIC PANEL  ? ? ?EKG ?None ? ?Radiology ?CT Angio Chest PE W and/or Wo Contrast ? ?Result Date: 07/16/2021 ?CLINICAL DATA:  Cough, chest pain EXAM: CT ANGIOGRAPHY CHEST WITH CONTRAST TECHNIQUE: Multidetector CT imaging of the chest was performed using the standard protocol during bolus administration of intravenous contrast. Multiplanar CT image reconstructions and MIPs were obtained to evaluate the vascular anatomy. RADIATION DOSE REDUCTION: This exam was performed according to the departmental dose-optimization program which includes automated exposure control, adjustment of the mA and/or kV according to patient size and/or use of iterative reconstruction technique. CONTRAST:  58mL OMNIPAQUE IOHEXOL 350 MG/ML SOLN COMPARISON:  Chest x-ray 07/14/2021 FINDINGS: Cardiovascular: No pulmonary embolism identified. Main pulmonary artery is normal caliber. Heart size is normal. No pericardial effusion visualized. Thoracic aorta is normal in caliber. Mediastinum/Nodes: No bulky axillary, hilar or mediastinal lymphadenopathy identified. Lungs/Pleura: Mild emphysematous changes of the lungs, upper lobe predominant. No focal consolidation, pleural effusion or pneumothorax. Upper Abdomen: No acute process identified.  Small hiatal hernia. Musculoskeletal: No chest wall abnormality. No acute or significant osseous findings. Review of the MIP images confirms the above findings. IMPRESSION: 1. No pulmonary embolism identified. 2. Mild emphysema. 3. Small hiatal hernia. Electronically Signed   By: Ofilia Neas M.D.   On: 07/16/2021 10:19   ? ?Procedures ?Procedures  ? ? ?Medications Ordered in ED ?Medications  ?iohexol (OMNIPAQUE) 350 MG/ML injection 100 mL (61 mLs Intravenous Contrast Given 07/16/21 1003)  ? ? ?ED Course/ Medical Decision Making/ A&P ?  ?                        ?Medical Decision Making ?Amount and/or Complexity  of Data Reviewed ?Labs: ordered. ?Radiology: ordered. ? ?Risk ?Prescription drug management. ? ? ?Review of chart shows primary care doctor office visit 2 days ago with x-ray that was unremarkable. ? ?This patient presents to the ED for concern of chest pain this involves an extensive number of treatment options, and is a complaint that carries with it a high risk of complications and morbidity.  The differential diagnosis includes pulmonary embolism, aortic dissection, pneumothorax, acute coronary syndrome, pneumonia, other persistent cough ?  ?  ?Co morbidities that complicate the patient evaluation ?  ?Peripheral vascular disease, smoking history, COPD ?  ? ?Lab Tests: ?  ?I Ordered, and personally interpreted labs.  The pertinent results include: BC, CMP these are unremarkable and normal. ?  ?  ?Imaging Studies ordered: ?  ?CT angio chest, no evidence of pneumonia or pulmonary embolism or aortic etiology. ?  ?  ?  Cardiac Monitoring: ?  ?Sinus rhythm on the monitor.  Regular rate. ?  ?  ?Patient likely has rib contusion/pain due to persistent cough ongoing for several weeks.  No emergent etiology noted.  Work-up was otherwise unremarkable. ? ?Recommend outpatient follow-up within the week, recommending Tylenol Motrin as needed for pain.  Advised immediate return for worsening symptoms or any additional concerns. ? ? ? ? ? ? ? ? ? ?Final Clinical Impression(s) / ED Diagnoses ?Final diagnoses:  ?Chest pain, unspecified type  ? ? ?Rx / DC Orders ?ED Discharge Orders   ? ? None  ? ?  ? ? ?  ?Luna Fuse, MD ?07/16/21 1030 ? ?

## 2021-07-16 NOTE — Discharge Instructions (Signed)
Call your primary care doctor or specialist as discussed in the next 2-3 days.   Return immediately back to the ER if:  Your symptoms worsen within the next 12-24 hours. You develop new symptoms such as new fevers, persistent vomiting, new pain, shortness of breath, or new weakness or numbness, or if you have any other concerns.  

## 2021-07-16 NOTE — ED Triage Notes (Signed)
Pt arrives to ED with c/o shortness of breath. Pt reports that she has felt SOB for the past three weeks. Pt was dx with a viral URI x3 weeks ago with associated symptoms of cough, congestion, low grade fever, left sided rib pain, fatigue.  ?

## 2021-07-19 ENCOUNTER — Other Ambulatory Visit: Payer: Self-pay

## 2021-07-19 ENCOUNTER — Emergency Department (HOSPITAL_BASED_OUTPATIENT_CLINIC_OR_DEPARTMENT_OTHER)
Admission: EM | Admit: 2021-07-19 | Discharge: 2021-07-19 | Disposition: A | Discharge status: Home / Self Care | Payer: BC Managed Care – PPO | Attending: Emergency Medicine | Admitting: Emergency Medicine

## 2021-07-19 ENCOUNTER — Encounter (HOSPITAL_BASED_OUTPATIENT_CLINIC_OR_DEPARTMENT_OTHER): Payer: Self-pay | Admitting: Emergency Medicine

## 2021-07-19 DIAGNOSIS — F17210 Nicotine dependence, cigarettes, uncomplicated: Secondary | ICD-10-CM | POA: Diagnosis not present

## 2021-07-19 DIAGNOSIS — K2901 Acute gastritis with bleeding: Secondary | ICD-10-CM | POA: Diagnosis not present

## 2021-07-19 DIAGNOSIS — K921 Melena: Secondary | ICD-10-CM | POA: Insufficient documentation

## 2021-07-19 DIAGNOSIS — R1012 Left upper quadrant pain: Secondary | ICD-10-CM | POA: Diagnosis present

## 2021-07-19 DIAGNOSIS — R195 Other fecal abnormalities: Secondary | ICD-10-CM

## 2021-07-19 DIAGNOSIS — Z7982 Long term (current) use of aspirin: Secondary | ICD-10-CM | POA: Diagnosis not present

## 2021-07-19 LAB — COMPREHENSIVE METABOLIC PANEL
ALT: 56 U/L — ABNORMAL HIGH (ref 0–44)
AST: 40 U/L (ref 15–41)
Albumin: 4.3 g/dL (ref 3.5–5.0)
Alkaline Phosphatase: 74 U/L (ref 38–126)
Anion gap: 9 (ref 5–15)
BUN: 18 mg/dL (ref 6–20)
CO2: 28 mmol/L (ref 22–32)
Calcium: 9.2 mg/dL (ref 8.9–10.3)
Chloride: 103 mmol/L (ref 98–111)
Creatinine, Ser: 0.78 mg/dL (ref 0.44–1.00)
GFR, Estimated: >60 mL/min (ref >60)
Glucose, Bld: 92 mg/dL (ref 70–99)
Potassium: 4.3 mmol/L (ref 3.5–5.1)
Sodium: 140 mmol/L (ref 135–145)
Total Bilirubin: 0.4 mg/dL (ref 0.3–1.2)
Total Protein: 7 g/dL (ref 6.5–8.1)

## 2021-07-19 LAB — LIPASE, BLOOD: Lipase: 33 U/L (ref 11–51)

## 2021-07-19 LAB — URINALYSIS, ROUTINE W REFLEX MICROSCOPIC
Bilirubin Urine: NEGATIVE
Glucose, UA: NEGATIVE mg/dL
Hgb urine dipstick: NEGATIVE
Ketones, ur: NEGATIVE mg/dL
Leukocytes,Ua: NEGATIVE
Nitrite: NEGATIVE
Specific Gravity, Urine: 1.023 (ref 1.005–1.030)
pH: 7.5 (ref 5.0–8.0)

## 2021-07-19 LAB — CBC
HCT: 44.6 % (ref 36.0–46.0)
Hemoglobin: 14.8 g/dL (ref 12.0–15.0)
MCH: 30.5 pg (ref 26.0–34.0)
MCHC: 33.2 g/dL (ref 30.0–36.0)
MCV: 92 fL (ref 80.0–100.0)
Platelets: 240 10*3/uL (ref 150–400)
RBC: 4.85 MIL/uL (ref 3.87–5.11)
RDW: 12.1 % (ref 11.5–15.5)
WBC: 7.1 10*3/uL (ref 4.0–10.5)
nRBC: 0 % (ref 0.0–0.2)

## 2021-07-19 LAB — OCCULT BLOOD X 1 CARD TO LAB, STOOL: Fecal Occult Bld: POSITIVE — AB

## 2021-07-19 MED ORDER — PANTOPRAZOLE SODIUM 40 MG IV SOLR
40.0000 mg | Freq: Once | INTRAVENOUS | Status: AC
  Administered 2021-07-19: 40 mg via INTRAVENOUS
  Filled 2021-07-19: qty 10

## 2021-07-19 NOTE — ED Triage Notes (Signed)
Was seen Friday for abd pain. States she vomited bright red blood Saturday morning. States yesterday she had dark stools. States LUQ pain is ongoing. States she feels very bloated and fatigued.  ?

## 2021-07-19 NOTE — Discharge Instructions (Signed)
You were seen in the emergency department for some stomach pain vomiting of blood and dark stools.  Your lab work showed your red blood cell count to be stable.  You were positive for blood rectally though.  This is likely due to your Motrin and Aleve use.  Please stop those.  Take your acid medication twice a day.  Follow-up with your primary care doctor.  We are also putting a referral into you for gastroenterology.  Return if any worsening or concerning symptoms. ?

## 2021-07-19 NOTE — ED Provider Notes (Signed)
?[facility] ?Provider Note ? ? ?CSN: [id number] ?Arrival date & time: 07/19/21  0840 ? ?  ? ?History ? ?Chief Complaint  ?Patient presents with  ? Hematemesis  ? ? ?[name] is a 61 y.o. female.  She has been troubled by a cough and left upper quadrant and left lower chest pain that is been going on over a week.  She has been taking a lot of Advil Tylenol Mucinex.  Was seen here 3 days ago for same had a CT Chest that did not show any acute findings other than small hiatal hernia.  Is continuing to use ibuprofen.  2 days ago she said she vomited up some red blood and yesterday into today has been having dark diarrhea.  Has having burning discomfort in her chest . no significant abdominal pain.  Does feel more fatigued.  No fevers or chills.  Does smoke cigarettes and uses alcohol infrequently.  Not on any blood thinners.  No prior history of GI bleed. ? ?The history is provided by the patient.  ?GI Problem ?This is a new problem. The current episode started 2 days ago. The problem occurs daily. Associated symptoms include chest pain and abdominal pain. Pertinent negatives include no headaches and no shortness of breath. Nothing aggravates the symptoms. Nothing relieves the symptoms. She has tried rest for the symptoms. The treatment provided no relief.  ? ?  ? ?Home Medications ?Prior to Admission medications   ?Medication Sig Start Date End Date Taking? Authorizing Provider  ?albuterol (VENTOLIN HFA) 108 (90 Base) MCG/ACT inhaler albuterol sulfate HFA 90 mcg/actuation aerosol inhaler ? INHALE 2 PUFF(S) EVERY 4 HOURS AS NEEDED. 07/09/21   [provider]  ?ALPRAZolam Duanne Moron) 0.5 MG tablet Take 1 tablet (0.5 mg total) by mouth at bedtime as needed for sleep. 04/29/21   Isaac Bliss, Rayford Halsted, MD  ?aspirin 81 MG chewable tablet Chew 1 tablet (81 mg total) by mouth daily. 02/05/13   Richrd Prime, PA-C  ?azithromycin (ZITHROMAX) 250 MG tablet azithromycin 250 mg  tablet ? TAKE 2 TABLETS (500 MG) BY ORAL ROUTE ONCE DAILY FOR 1 DAY THEN 1 TABLET (250 MG) BY ORAL ROUTE ONCE DAILY FOR 4 DAYS    [provider]  ?benzonatate (TESSALON) 100 MG capsule Take 1 capsule (100 mg total) by mouth 2 (two) times daily as needed for cough. 07/07/21   Billie Ruddy, MD  ?cetirizine (ZYRTEC) 10 MG tablet Take 1 tablet (10 mg total) by mouth daily. 07/07/21   Billie Ruddy, MD  ?fluticasone (FLONASE) 50 MCG/ACT nasal spray Place 1 spray into both nostrils daily. 07/07/21   Billie Ruddy, MD  ?Nutritional Supplements (BIFIDO-GENIC GROWTH FACTORS PO) Take 4 tablets by mouth daily.    [provider]  ?pantoprazole (PROTONIX) 40 MG tablet Take 1 tablet (40 mg total) by mouth daily. 10/29/20   Isaac Bliss, Rayford Halsted, MD  ?rosuvastatin (CRESTOR) 20 MG tablet Take 1 tablet (20 mg total) by mouth daily. 06/11/20   Isaac Bliss, Rayford Halsted, MD  ?valACYclovir (VALTREX) 500 MG tablet TAKE 1 TABLET (500 MG TOTAL) BY MOUTH AS NEEDED. 08/06/20   Isaac Bliss, Rayford Halsted, MD  ?venlafaxine XR (EFFEXOR-XR) 75 MG 24 hr capsule TAKE 1 CAPSULE BY MOUTH DAILY WITH BREAKFAST. 07/06/21   Isaac Bliss, Rayford Halsted, MD  ?   ? ?Allergies    ?Chantix [varenicline], Lipitor [atorvastatin], Septra [sulfamethoxazole-trimethoprim], and Simvastatin   ? ?Review of Systems   ?Review of  Systems  ?Constitutional:  Positive for fatigue.  ?HENT:  Negative for sore throat.   ?Respiratory:  Negative for shortness of breath.   ?Cardiovascular:  Positive for chest pain.  ?Gastrointestinal:  Positive for abdominal pain, diarrhea and vomiting.  ?Genitourinary:  Negative for dysuria.  ?Skin:  Negative for rash.  ?Neurological:  Negative for headaches.  ? ?Physical Exam ?Updated Vital Signs ?BP 132/85 (BP Location: Left Arm)   Pulse 84   Temp 97.8 ?F (36.6 ?C) (Oral)   Resp 16   Ht '5\' 7"'$  (1.702 m)   Wt 70.3 kg   LMP 10/31/2010   SpO2 99%   BMI 24.28 kg/m?  ?Physical Exam ?Vitals and nursing note  reviewed.  ?Constitutional:   ?   General: She is not in acute distress. ?   Appearance: Normal appearance. She is well-developed.  ?HENT:  ?   Head: Normocephalic and atraumatic.  ?Eyes:  ?   Conjunctiva/sclera: Conjunctivae normal.  ?Cardiovascular:  ?   Rate and Rhythm: Normal rate and regular rhythm.  ?   Heart sounds: No murmur heard. ?Pulmonary:  ?   Effort: Pulmonary effort is normal. No respiratory distress.  ?   Breath sounds: Normal breath sounds.  ?Abdominal:  ?   Palpations: Abdomen is soft.  ?   Tenderness: There is no abdominal tenderness. There is no guarding or rebound.  ?Musculoskeletal:     ?   General: No swelling.  ?   Cervical back: Neck supple.  ?   Right lower leg: No edema.  ?   Left lower leg: No edema.  ?Skin: ?   General: Skin is warm and dry.  ?   Capillary Refill: Capillary refill takes less than 2 seconds.  ?Neurological:  ?   General: No focal deficit present.  ?   Mental Status: She is alert.  ? ? ?ED Results / Procedures / Treatments   ?Labs ?(all labs ordered are listed, but only abnormal results are displayed) ?Labs Reviewed  ?COMPREHENSIVE METABOLIC PANEL - Abnormal; Notable for the following components:  ?    Result Value  ? ALT 56 (*)   ? All other components within normal limits  ?URINALYSIS, ROUTINE W REFLEX MICROSCOPIC - Abnormal; Notable for the following components:  ? Protein, ur TRACE (*)   ? All other components within normal limits  ?OCCULT BLOOD X 1 CARD TO LAB, STOOL - Abnormal; Notable for the following components:  ? Fecal Occult Bld POSITIVE (*)   ? All other components within normal limits  ?LIPASE, BLOOD  ?CBC  ? ? ?EKG ?None ? ?Radiology ?No results found. ? ?Procedures ?Procedures  ? ? ?Medications Ordered in ED ?Medications  ?pantoprazole (PROTONIX) injection 40 mg (has no administration in time range)  ? ? ?ED Course/ Medical Decision Making/ A&P ?Clinical Course as of 07/19/21 1647  ?Mon Jul 19, 2021  ?1015 Rectal exam done with Tonya as chaperone.  Normal  tone no masses.  Sample sent to lab for guaiac. [MB]  ?1148 Reviewed results of work-up with her.  Recommended increasing her PPI and stopping NSAIDs.  Will give referral to GI.  Return instructions discussed [MB]  ?  ?Clinical Course User Index ?[MB] Hayden Rasmussen, MD  ? ?                        ?Medical Decision Making ?Amount and/or Complexity of Data Reviewed ?Labs: ordered. ? ?Risk ?Prescription drug management. ? ?This patient complains  of some blood and some dark stool, chest burning; this involves an extensive number of treatment ?Options and is a complaint that carries with it a high risk of complications and ?morbidity. The differential includes gastritis, peptic ulcer disease, GI bleed, pneumonia, pancreatitis, ? ?I ordered, reviewed and interpreted labs, which included CBC with normal white count normal hemoglobin, chemistries and LFTs normal other than mildly elevated ALT, urinalysis without signs of infection, fecal occult positive, lipase normal ?I ordered medication IV Protonix with improvement in her symptoms and reviewed PMP when indicated. ?Additional history obtained from patient significant other ?Previous records obtained and reviewed in epic including recent ED visit ?Cardiac monitoring reviewed, patient in normal sinus rhythm ?Social determinants considered, tobacco use ?Critical Interventions: None ? ?After the interventions stated above, I reevaluated the patient and found patient to be symptomatically improved.  Benign abdominal exam.  Hemodynamically stable. ?Admission and further testing considered, no indications for admission or further work-up at this time.  Will increase PPI and have stop NSAIDs limit tobacco.  Referral placed for GI.  Return instructions discussed ? ? ? ? ? ? ? ? ? ?Final Clinical Impression(s) / ED Diagnoses ?Final diagnoses:  ?Acute gastritis with hemorrhage, unspecified gastritis type  ?Heme positive stool  ? ? ?Rx / DC Orders ?ED Discharge Orders   ? ? None   ? ?  ? ? ?  ?Hayden Rasmussen, MD ?07/19/21 1655 ? ?

## 2021-07-20 ENCOUNTER — Other Ambulatory Visit: Payer: Self-pay | Admitting: Internal Medicine

## 2021-07-20 DIAGNOSIS — E785 Hyperlipidemia, unspecified: Secondary | ICD-10-CM

## 2021-07-23 ENCOUNTER — Ambulatory Visit
Admission: RE | Admit: 2021-07-23 | Discharge: 2021-07-23 | Disposition: A | Discharge status: Home / Self Care | Payer: BC Managed Care – PPO | Source: Ambulatory Visit | Attending: Internal Medicine | Admitting: Internal Medicine

## 2021-07-23 ENCOUNTER — Other Ambulatory Visit: Payer: Self-pay

## 2021-07-23 ENCOUNTER — Ambulatory Visit: Payer: BC Managed Care – PPO

## 2021-07-23 DIAGNOSIS — R928 Other abnormal and inconclusive findings on diagnostic imaging of breast: Secondary | ICD-10-CM

## 2021-07-26 ENCOUNTER — Encounter: Payer: Self-pay | Admitting: Physician Assistant

## 2021-07-28 ENCOUNTER — Other Ambulatory Visit: Payer: BC Managed Care – PPO

## 2021-07-29 ENCOUNTER — Other Ambulatory Visit: Payer: Self-pay | Admitting: Family Medicine

## 2021-08-06 ENCOUNTER — Encounter: Payer: Self-pay | Admitting: Physician Assistant

## 2021-08-06 ENCOUNTER — Other Ambulatory Visit (INDEPENDENT_AMBULATORY_CARE_PROVIDER_SITE_OTHER): Payer: BC Managed Care – PPO

## 2021-08-06 ENCOUNTER — Ambulatory Visit: Payer: BC Managed Care – PPO | Admitting: Physician Assistant

## 2021-08-06 VITALS — BP 130/88 | Ht 67.0 in | Wt 161.0 lb

## 2021-08-06 DIAGNOSIS — K920 Hematemesis: Secondary | ICD-10-CM

## 2021-08-06 DIAGNOSIS — K219 Gastro-esophageal reflux disease without esophagitis: Secondary | ICD-10-CM

## 2021-08-06 DIAGNOSIS — Z86010 Personal history of colonic polyps: Secondary | ICD-10-CM | POA: Diagnosis not present

## 2021-08-06 DIAGNOSIS — K921 Melena: Secondary | ICD-10-CM | POA: Diagnosis not present

## 2021-08-06 DIAGNOSIS — R1012 Left upper quadrant pain: Secondary | ICD-10-CM

## 2021-08-06 LAB — COMPREHENSIVE METABOLIC PANEL
ALT: 26 U/L (ref 0–35)
AST: 26 U/L (ref 0–37)
Albumin: 4.1 g/dL (ref 3.5–5.2)
Alkaline Phosphatase: 83 U/L (ref 39–117)
BUN: 17 mg/dL (ref 6–23)
CO2: 30 mEq/L (ref 19–32)
Calcium: 9.3 mg/dL (ref 8.4–10.5)
Chloride: 104 mEq/L (ref 96–112)
Creatinine, Ser: 0.81 mg/dL (ref 0.40–1.20)
GFR: 78.92 mL/min (ref >60.00)
Glucose, Bld: 92 mg/dL (ref 70–99)
Potassium: 4.4 mEq/L (ref 3.5–5.1)
Sodium: 140 mEq/L (ref 135–145)
Total Bilirubin: 0.3 mg/dL (ref 0.2–1.2)
Total Protein: 6.7 g/dL (ref 6.0–8.3)

## 2021-08-06 LAB — CBC WITH DIFFERENTIAL/PLATELET
Basophils Absolute: 0 10*3/uL (ref 0.0–0.1)
Basophils Relative: 0.8 % (ref 0.0–3.0)
Eosinophils Absolute: 0.1 10*3/uL (ref 0.0–0.7)
Eosinophils Relative: 1.9 % (ref 0.0–5.0)
HCT: 42.1 % (ref 36.0–46.0)
Hemoglobin: 14.3 g/dL (ref 12.0–15.0)
Lymphocytes Relative: 25.6 % (ref 12.0–46.0)
Lymphs Abs: 1.4 10*3/uL (ref 0.7–4.0)
MCHC: 33.9 g/dL (ref 30.0–36.0)
MCV: 93.4 fl (ref 78.0–100.0)
Monocytes Absolute: 0.8 10*3/uL (ref 0.1–1.0)
Monocytes Relative: 14.6 % — ABNORMAL HIGH (ref 3.0–12.0)
Neutro Abs: 3.2 10*3/uL (ref 1.4–7.7)
Neutrophils Relative %: 57.1 % (ref 43.0–77.0)
Platelets: 198 10*3/uL (ref 150.0–400.0)
RBC: 4.51 Mil/uL (ref 3.87–5.11)
RDW: 13.2 % (ref 11.5–15.5)
WBC: 5.5 10*3/uL (ref 4.0–10.5)

## 2021-08-06 MED ORDER — NA SULFATE-K SULFATE-MG SULF 17.5-3.13-1.6 GM/177ML PO SOLN: 1.0000 | Freq: Once | ORAL | 0 refills | Status: AC

## 2021-08-06 MED ORDER — PANTOPRAZOLE SODIUM 40 MG PO TBEC: 40.0000 mg | DELAYED_RELEASE_TABLET | Freq: Two times a day (BID) | ORAL | 1 refills | Status: DC

## 2021-08-06 NOTE — Progress Notes (Signed)
Agree with assessment and plan as outlined.  

## 2021-08-06 NOTE — Patient Instructions (Signed)
Your provider has requested that you go to the basement level for lab work before leaving today. Press "B" on the elevator. The lab is located at the first door on the left as you exit the elevator. ? ?We have sent the following medications to your pharmacy for you to pick up at your convenience: ?Pantoprazole 40 mg twice daily 30-60 minutes before breakfast and dinner. ? ?You have been scheduled for an endoscopy and colonoscopy. Please follow the written instructions given to you at your visit today. ?Please pick up your prep supplies at the pharmacy within the next 1-3 days. ?If you use inhalers (even only as needed), please bring them with you on the day of your procedure. ? ?If you are age 34 or older, your body mass index should be between 23-30. Your Body mass index is 25.22 kg/m?Marland Kitchen If this is out of the aforementioned range listed, please consider follow up with your Primary Care Provider. ? ?If you are age 86 or younger, your body mass index should be between 19-25. Your Body mass index is 25.22 kg/m?Marland Kitchen If this is out of the aformentioned range listed, please consider follow up with your Primary Care Provider.  ? ?________________________________________________________ ? ?The [facility] providers would like to encourage you to use [facility] to communicate with providers for non-urgent requests or questions.  Due to long hold times on the telephone, sending your provider a message by [facility] may be a faster and more efficient way to get a response.  Please allow 48 business hours for a response.  Please remember that this is for non-urgent requests.  ?_______________________________________________________ ? ?

## 2021-08-06 NOTE — Progress Notes (Addendum)
? ?Chief Complaint: ER follow-up hematemesis ? ?HPI: ?   [name] is a 61 year old female, previously known to Dr. Collene Mares, with a past medical history as listed below including anxiety, constipation, depression, GERD and multiple others, who was referred to me by Isaac Bliss, [place]* for follow-up after being seen in the ER for suspected hematemesis. ?   11/10/2017 colonoscopy with Dr. Collene Mares for screening with 7 small sessile polyps found, 6 in the rectosigmoid colon and one in the proximal ascending colon, two 6-7 mm polyps, 1 in the rectosigmoid colon and one in the proximal descending colon as well as a few small and large mouth diverticula in the sigmoid colon as well as small internal hemorrhoids.  I cannot see pathology but repeat was recommended in 3 to 5 years-pending pathology. ?   07/19/2021 patient seen at the [facility] ER for hematemesis.  At that time discussed she been troubled by cough and left upper quadrant and left lower chest pain for a week and has been taking a lot of Advil, Tylenol and Mucinex.  Apparently had been seen 3 days prior with a CT of the chest did not show anything acute other than the small hiatal hernia.  He continued to use Ibuprofen.  Described 2 days prior she had vomited up some red blood and the day before has been having "dark diarrhea".  Describes a burning discomfort in her chest.  CMP with an ALT of 56 otherwise normal, urinalysis with trace protein, fecal occult positive, lipase and CBC normal.  Rectal exam at that time was normal.  At that time recommend the she increase her PPI and she was sent to our clinic. ?   Today, the patient tells me that for the past 6 months she has felt bloated and had a decrease in appetite with ability to only eat small meals.  This really did not bother her but then about a 1-2 months ago now she started with worsening left upper quadrant pain which she told was likely related to a cough that she had after COVID in December.  Her PCP  apparently thought she broke her rib and she was just told that she was "old", this pain continued and back at the beginning of March as above she was seen in the ER after starting with vomiting.  Tells me that she vomited blood on multiple occasions and the pain seemed to go away.  Tells me that she is currently on Pepcid 20 mg twice a day and this has helped with a lot of her symptoms, but she has still vomited twice this week, the last time on Tuesday after eating a [employer] sandwich.  Tells me that she did recently drink a lot of alcohol because her friend was diagnosed with cancer and she flew to see her.  Tells me alcohol always makes her stomach burn.  Typically she only has a glass of wine with dinner.  Associated symptoms include some black tarry stools for about a week which stopped about a week ago.  Patient also reports using high-dose NSAIDs. ?   Also discusses her last colonoscopy.  Tells me she was told to have a repeat in 3 years and knows she is due now.  She would like to get this done. ?   Patient is very active and tries to workout daily. ?   Denies fever, chills or weight loss. ? ?Past Medical History:  ?Diagnosis Date  ? Anxiety   ? takes Xanax prn   ?  Back pain   ? DDD  ? Constipation   ? Depression   ? takes Effexor daily  ? Genital herpes   ? GERD (gastroesophageal reflux disease)   ? takes Omeprazole daily  ? Hemorrhoids   ? History of abnormal cervical Pap smear   ? age 9--hx conization-paps normal since  ? History of bronchitis   ? last time 32month ago  ? History of colon polyps   ? History of migraine   ? 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 HSV-1 infection   ? Hyperlipidemia   ? not on meds at present and Dr.McKenzie will review after surgery  ? Insomnia   ? Peripheral vascular disease ([place]  ? Vitamin B12 deficiency   ? Weakness   ? bil legs  ? ? ?Past Surgical History:  ?Procedure Laterality Date  ? ANKLE SURGERY Left   ? with 3 screws and 2 pens  ? AORTA - BILATERAL FEMORAL ARTERY  BYPASS GRAFT N/A 01/30/2013  ? Procedure: AORTA BIFEMORAL BYPASS GRAFT;  Surgeon: JaMal MistyMD;  [facility]: [facility];  Laterality: N/A;  ? BREAST SURGERY  02/2020  ? breast reduction   ? CERVICAL CONE BIOPSY    ? age 89? colonscopy    ? ECTOPIC PREGNANCY SURGERY    ? x 2  ? left fallopian tube removed    ? wisdom teeth extracted     ? ? ?Current Outpatient Medications  ?Medication Sig Dispense Refill  ? albuterol (VENTOLIN HFA) 108 (90 Base) MCG/ACT inhaler albuterol sulfate HFA 90 mcg/actuation aerosol inhaler ? INHALE 2 PUFF(S) EVERY 4 HOURS AS NEEDED.    ? ALPRAZolam (XANAX) 0.5 MG tablet Take 1 tablet (0.5 mg total) by mouth at bedtime as needed for sleep. 30 tablet 1  ? aspirin 81 MG chewable tablet Chew 1 tablet (81 mg total) by mouth daily.    ? azithromycin (ZITHROMAX) 250 MG tablet azithromycin 250 mg tablet ? TAKE 2 TABLETS (500 MG) BY ORAL ROUTE ONCE DAILY FOR 1 DAY THEN 1 TABLET (250 MG) BY ORAL ROUTE ONCE DAILY FOR 4 DAYS    ? benzonatate (TESSALON) 100 MG capsule Take 1 capsule (100 mg total) by mouth 2 (two) times daily as needed for cough. 20 capsule 0  ? cetirizine (ZYRTEC) 10 MG tablet TAKE 1 TABLET BY MOUTH EVERY DAY 90 tablet 1  ? fluticasone (FLONASE) 50 MCG/ACT nasal spray Place 1 spray into both nostrils daily. 16 g 0  ? Nutritional Supplements (BIFIDO-GENIC GROWTH FACTORS PO) Take 4 tablets by mouth daily.    ? pantoprazole (PROTONIX) 40 MG tablet Take 1 tablet (40 mg total) by mouth daily. 90 tablet 1  ? rosuvastatin (CRESTOR) 20 MG tablet TAKE 1 TABLET BY MOUTH EVERY DAY 90 tablet 0  ? valACYclovir (VALTREX) 500 MG tablet TAKE 1 TABLET (500 MG TOTAL) BY MOUTH AS NEEDED. 30 tablet 5  ? venlafaxine XR (EFFEXOR-XR) 75 MG 24 hr capsule TAKE 1 CAPSULE BY MOUTH DAILY WITH BREAKFAST. 90 capsule 0  ? ?No current facility-administered medications for this visit.  ? ? ?Allergies as of 08/06/2021 - Review Complete 07/19/2021  ?Allergen Reaction Noted  ? Chantix [varenicline]   12/20/2012  ? Lipitor [atorvastatin]  06/18/2020  ? Septra [sulfamethoxazole-trimethoprim] Other (See Comments) 12/20/2012  ? Simvastatin  12/20/2012  ? ? ?Family History  ?Problem Relation Age of Onset  ? Hyperlipidemia Mother   ? Breast cancer Other   ? Heart attack Maternal  Grandfather   ? Hypertension Paternal Grandmother   ? ? ?Social History  ? ?Socioeconomic History  ? Marital status: Married  ?  Spouse name: Not on file  ? Number of children: 1  ? Years of education: Not on file  ? Highest education level: Bachelor's degree (e.g., BA, AB, BS)  ?Occupational History  ?  Employer: [employer]  ?Tobacco Use  ? Smoking status: Every Day  ?  Packs/day: 0.50  ?  Years: 35.00  ?  Pack years: 17.50  ?  Types: Cigarettes  ? Smokeless tobacco: Never  ?Vaping Use  ? Vaping Use: Never used  ?Substance and Sexual Activity  ? Alcohol use: Yes  ?  Alcohol/week: 1.0 standard drink  ?  Types: 1 Glasses of wine per week  ?  Comment: weekends only  ? Drug use: No  ? Sexual activity: Yes  ?  Birth control/protection: Post-menopausal  ?Other Topics Concern  ? Not on file  ?Social History Narrative  ? Lives at home with husband.    ? ?Social Determinants of Health  ? ?Financial Resource Strain: Low Risk   ? Difficulty of Paying Living Expenses: Not hard at all  ?Food Insecurity: No Food Insecurity  ? Worried About Charity fundraiser in the Last Year: Never true  ? Ran Out of Food in the Last Year: Never true  ?Transportation Needs: No Transportation Needs  ? Lack of Transportation (Medical): No  ? Lack of Transportation (Non-Medical): No  ?Physical Activity: Sufficiently Active  ? Days of Exercise per Week: 6 days  ? Minutes of Exercise per Session: 60 min  ?Stress: Stress Concern Present  ? Feeling of Stress : Very much  ?Social Connections: Socially Integrated  ? Frequency of Communication with Friends and Family: More than three times a week  ? Frequency of Social Gatherings with Friends and Family: Twice a  week  ? Attends Religious Services: More than 4 times per year  ? Active Member of Clubs or Organizations: Yes  ? Attends Archivist Meetings: More than 4 times per year  ? Marital Status: Married

## 2021-08-09 ENCOUNTER — Encounter: Payer: Self-pay | Admitting: Gastroenterology

## 2021-08-10 NOTE — Telephone Encounter (Signed)
So Joey worked on this case.  (The new letters are not receptive to many patients) Her procedure will be considered diagnostic any way you look at it.  Z12.11 screening or preventative code can be used once every ten years.  If a patient has history of polyps, screenings no long apply.  If the patient has a GI issue, you can not use a screening dx code (z12.11) in conjunction with the GI issue diagnosis code. I think at some point if the patient has several colons back to back that are clear without polyps,  then the provider can change to screening code once again.  Because she's having an EGD, that itself is considered a diagnostic procedure.  So her procedures will hit her co-insurance of 20% because her deductible has already been met for the year.  Her out of pocket max is $4890 and she has $2232 remaining to meet before all charges are covered at 100%.  So with approx $3500-$4000 being billed to the insurance, she may see a total of $700-$800.  (Probably on the higher end of cost) Hope this makes sense.  ?

## 2021-08-10 NOTE — Telephone Encounter (Signed)
[name] the pt states her colon is listed as screening, however, she came in with GI complaints and has a history of polyps.  Can this be coded as such? ?

## 2021-08-10 NOTE — Telephone Encounter (Signed)
Amy can you help with this?  

## 2021-08-11 ENCOUNTER — Encounter: Payer: Self-pay | Admitting: Gastroenterology

## 2021-08-11 ENCOUNTER — Ambulatory Visit (AMBULATORY_SURGERY_CENTER): Payer: BC Managed Care – PPO | Admitting: Gastroenterology

## 2021-08-11 VITALS — BP 116/74 | HR 58 | Temp 97.7°F | Resp 12 | Ht 67.0 in | Wt 161.0 lb

## 2021-08-11 DIAGNOSIS — K449 Diaphragmatic hernia without obstruction or gangrene: Secondary | ICD-10-CM | POA: Diagnosis not present

## 2021-08-11 DIAGNOSIS — D120 Benign neoplasm of cecum: Secondary | ICD-10-CM

## 2021-08-11 DIAGNOSIS — K6289 Other specified diseases of anus and rectum: Secondary | ICD-10-CM

## 2021-08-11 DIAGNOSIS — Z86010 Personal history of colonic polyps: Secondary | ICD-10-CM

## 2021-08-11 DIAGNOSIS — K225 Diverticulum of esophagus, acquired: Secondary | ICD-10-CM | POA: Diagnosis not present

## 2021-08-11 DIAGNOSIS — K920 Hematemesis: Secondary | ICD-10-CM

## 2021-08-11 DIAGNOSIS — D123 Benign neoplasm of transverse colon: Secondary | ICD-10-CM

## 2021-08-11 DIAGNOSIS — D122 Benign neoplasm of ascending colon: Secondary | ICD-10-CM

## 2021-08-11 HISTORY — PX: COLONOSCOPY: SHX174

## 2021-08-11 HISTORY — PX: ESOPHAGOGASTRODUODENOSCOPY: SHX1529

## 2021-08-11 MED ORDER — SODIUM CHLORIDE 0.9 % IV SOLN: 500.0000 mL | Freq: Once | INTRAVENOUS | Status: DC

## 2021-08-11 NOTE — Progress Notes (Signed)
Called to room to assist during endoscopic procedure.  Patient ID and intended procedure confirmed with present staff. Received instructions for my participation in the procedure from the performing physician.  

## 2021-08-11 NOTE — Progress Notes (Signed)
1319 Robinul 0.1 mg IV given due large amount of secretions upon assessment.  MD made aware, vss  

## 2021-08-11 NOTE — Patient Instructions (Addendum)
Handout on polys, hemorrhoids, diverticulosis, and Hiatal hernia  provided  ? ?Await pathology results.  ? ?Continue current medications.  ? ?Can decrease Protonix to once daily at this time  ? ?YOU HAD AN ENDOSCOPIC PROCEDURE TODAY AT [facility]:   Refer to the procedure report that was given to you for any specific questions about what was found during the examination.  If the procedure report does not answer your questions, please call your gastroenterologist to clarify.  If you requested that your care partner not be given the details of your procedure findings, then the procedure report has been included in a sealed envelope for you to review at your convenience later. ? ?YOU SHOULD EXPECT: Some feelings of bloating in the abdomen. Passage of more gas than usual.  Walking can help get rid of the air that was put into your GI tract during the procedure and reduce the bloating. If you had a lower endoscopy (such as a colonoscopy or flexible sigmoidoscopy) you may notice spotting of blood in your stool or on the toilet paper. If you underwent a bowel prep for your procedure, you may not have a normal bowel movement for a few days. ? ?Please Note:  You might notice some irritation and congestion in your nose or some drainage.  This is from the oxygen used during your procedure.  There is no need for concern and it should clear up in a day or so. ? ?SYMPTOMS TO REPORT IMMEDIATELY: ? ?Following lower endoscopy (colonoscopy or flexible sigmoidoscopy): ? Excessive amounts of blood in the stool ? Significant tenderness or worsening of abdominal pains ? Swelling of the abdomen that is new, acute ? Fever of 100?F or higher ? ?Following upper endoscopy (EGD) ? Vomiting of blood or coffee ground material ? New chest pain or pain under the shoulder blades ? Painful or persistently difficult swallowing ? New shortness of breath ? Fever of 100?F or higher ? Black, tarry-looking stools ? ?For urgent or  emergent issues, a gastroenterologist can be reached at any hour by calling [phone]. ?Do not use MyChart messaging for urgent concerns.  ? ? ?DIET:  We do recommend a small meal at first, but then you may proceed to your regular diet.  Drink plenty of fluids but you should avoid alcoholic beverages for 24 hours. ? ?ACTIVITY:  You should plan to take it easy for the rest of today and you should NOT DRIVE or use heavy machinery until tomorrow (because of the sedation medicines used during the test).   ? ?FOLLOW UP: ?Our staff will call the number listed on your records 48-72 hours following your procedure to check on you and address any questions or concerns that you may have regarding the information given to you following your procedure. If we do not reach you, we will leave a message.  We will attempt to reach you two times.  During this call, we will ask if you have developed any symptoms of COVID 19. If you develop any symptoms (ie: fever, flu-like symptoms, shortness of breath, cough etc.) before then, please call [phone].  If you test positive for Covid 19 in the 2 weeks post procedure, please call and report this information to [place].   ? ?If any biopsies were taken you will be contacted by phone or by letter within the next 1-3 weeks.  Please call us at [phone] if you have not heard about the biopsies in 3 weeks.  ? ? ?  SIGNATURES/CONFIDENTIALITY: ?You and/or your care partner have signed paperwork which will be entered into your electronic medical record.  These signatures attest to the fact that that the information above on your After Visit Summary has been reviewed and is understood.  Full responsibility of the confidentiality of this discharge information lies with you and/or your care-partner. ? ? ?

## 2021-08-11 NOTE — Telephone Encounter (Signed)
Surveillance is "closely monitoring" because of previous findings.  This term is not considered preventative. Therefore, it is not covered 100%. ?

## 2021-08-11 NOTE — Progress Notes (Signed)
History and Physical Interval Note: Patient seen in the office on 08/06/21 - no interval changes. She has not had any recurrent vomiting, feeling better on PPI. I have discussed risks / benefits of endoscopy / colonoscopy with her and she wishes to proceed. ? ?08/11/2021 ?1:31 PM ? ?Donnabelle Blanchard  has presented today for endoscopic procedure(s), with the diagnosis of  ?Encounter Diagnoses  ?Name Primary?  ? Hematemesis, unspecified whether nausea present Yes  ? History of colonic polyps   ?Marland Kitchen  The various methods of evaluation and treatment have been discussed with the patient and/or family. After consideration of risks, benefits and other options for treatment, the patient has consented to  the endoscopic procedure(s). ? ? The patient's history has been reviewed, patient examined, no change in status, stable for surgery.  I have reviewed the patient's chart and labs.  Questions were answered to the patient's satisfaction.   ? ?Jolly Mango, MD ?[facility] ? ?

## 2021-08-11 NOTE — Op Note (Signed)
[facility] ?Patient Name: [name] ?Procedure Date: 08/11/2021 1:26 PM ?MRN: [id number] ?Endoscopist: Carlota Raspberry. Havery Moros , MD ?Age: 61 ?Referring MD:  ?Date of Birth: Jul 04, 1960 ?Gender: Female ?Account #: [id number] ?Procedure:                Colonoscopy ?Indications:              High risk colon cancer surveillance: Personal  ?                          history of colonic polyps - 10/2017 - 7 polyps ?Medicines:                Monitored Anesthesia Care ?Procedure:                Pre-Anesthesia Assessment: ?                          - Prior to the procedure, a History and Physical  ?                          was performed, and patient medications and  ?                          allergies were reviewed. The patient's tolerance of  ?                          previous anesthesia was also reviewed. The risks  ?                          and benefits of the procedure and the sedation  ?                          options and risks were discussed with the patient.  ?                          All questions were answered, and informed consent  ?                          was obtained. Prior Anticoagulants: The patient has  ?                          taken no previous anticoagulant or antiplatelet  ?                          agents. ASA Grade Assessment: III - A patient with  ?                          severe systemic disease. After reviewing the risks  ?                          and benefits, the patient was deemed in  ?                          satisfactory condition to undergo the procedure. ?  After obtaining informed consent, the colonoscope  ?                          was passed under direct vision. Throughout the  ?                          procedure, the patient's blood pressure, pulse, and  ?                          oxygen saturations were monitored continuously. The  ?                          [id number]-HQ190L Colonoscope was introduced through the  ?                          anus and  advanced to the the cecum, identified by  ?                          appendiceal orifice and ileocecal valve. The  ?                          colonoscopy was performed without difficulty. The  ?                          patient tolerated the procedure well. The quality  ?                          of the bowel preparation was good. The ileocecal  ?                          valve, appendiceal orifice, and rectum were  ?                          photographed. ?Scope In: 1:40:46 PM ?Scope Out: 2:02:28 PM ?Scope Withdrawal Time: 0 hours 18 minutes 44 seconds  ?Total Procedure Duration: 0 hours 21 minutes 42 seconds  ?Findings:                 The perianal and digital rectal examinations were  ?                          normal. ?                          A diminutive polyp was found in the cecum. The  ?                          polyp was sessile. The polyp was removed with a  ?                          cold snare. Resection and retrieval were complete. ?                          There was a medium-sized lipoma, in the ascending  ?  colon. ?                          Two sessile polyps were found in the ascending  ?                          colon. The polyps were 3 mm in size. These polyps  ?                          were removed with a cold snare. Resection and  ?                          retrieval were complete. ?                          A 4 mm polyp was found in the hepatic flexure. The  ?                          polyp was sessile. The polyp was removed with a  ?                          cold snare. Resection and retrieval were complete. ?                          Multiple small-mouthed diverticula were found in  ?                          the entire colon. ?                          Anal papilla(e) were hypertrophied. Biopsies were  ?                          taken with a cold forceps for histology, rule out  ?                          AIN. ?                          Internal hemorrhoids were  found during retroflexion. ?                          The exam was otherwise without abnormality. ?Complications:            No immediate complications. Estimated blood loss:  ?                          Minimal. ?Estimated Blood Loss:     Estimated blood loss was minimal. ?Impression:               - One diminutive polyp in the cecum, removed with a  ?                          cold snare. Resected and retrieved. ?                          -  Medium-sized lipoma in the ascending colon. ?                          - Two 3 mm polyps in the ascending colon, removed  ?                          with a cold snare. Resected and retrieved. ?                          - One 4 mm polyp at the hepatic flexure, removed  ?                          with a cold snare. Resected and retrieved. ?                          - Diverticulosis in the entire examined colon. ?                          - Anal papilla(e) were hypertrophied. Biopsied to  ?                          rule out AIN. ?                          - Internal hemorrhoids. ?                          - The examination was otherwise normal. ?Recommendation:           - Patient has a contact number available for  ?                          emergencies. The signs and symptoms of potential  ?                          delayed complications were discussed with the  ?                          patient. Return to normal activities tomorrow.  ?                          Written discharge instructions were provided to the  ?                          patient. ?                          - Resume previous diet. ?                          - Continue present medications. ?                          - Await pathology results. ?Carlota Raspberry. Oaklyn Jakubek, MD ?08/11/2021 2:08:46 PM ?This report has been signed electronically. ?

## 2021-08-11 NOTE — Progress Notes (Signed)
VS by DT    

## 2021-08-11 NOTE — Op Note (Signed)
[place] ?Patient Name: Tinaya Ceballos ?Procedure Date: 08/11/2021 1:27 PM ?MRN: [id number] ?Endoscopist: Carlota Raspberry. Havery Moros , MD ?Age: 61 ?Referring MD:  ?Date of Birth: 1961-03-04 ?Gender: Female ?Account #: [id number] ?Procedure:                Upper GI endoscopy ?Indications:              Hematemesis 3 weeks ago, since resolved ?Medicines:                Monitored Anesthesia Care ?Procedure:                Pre-Anesthesia Assessment: ?                          - Prior to the procedure, a History and Physical  ?                          was performed, and patient medications and  ?                          allergies were reviewed. The patient's tolerance of  ?                          previous anesthesia was also reviewed. The risks  ?                          and benefits of the procedure and the sedation  ?                          options and risks were discussed with the patient.  ?                          All questions were answered, and informed consent  ?                          was obtained. Prior Anticoagulants: The patient has  ?                          taken no previous anticoagulant or antiplatelet  ?                          agents. ASA Grade Assessment: III - A patient with  ?                          severe systemic disease. After reviewing the risks  ?                          and benefits, the patient was deemed in  ?                          satisfactory condition to undergo the procedure. ?                          After obtaining informed consent, the endoscope was  ?  passed under direct vision. Throughout the  ?                          procedure, the patient's blood pressure, pulse, and  ?                          oxygen saturations were monitored continuously. The  ?                          Endoscope was introduced through the mouth, and  ?                          advanced to the second part of duodenum. The upper  ?                          GI  endoscopy was accomplished without difficulty.  ?                          The patient tolerated the procedure well. ?Scope In: ?Scope Out: ?Findings:                 Esophagogastric landmarks were identified: the  ?                          Z-line was found at 35 cm, the gastroesophageal  ?                          junction was found at 35 cm and the upper extent of  ?                          the gastric folds was found at 40 cm from the  ?                          incisors. ?                          A 5 cm hiatal hernia was present. ?                          There was evidence of healed esophagitis /  ?                          ulceration at the GEJ, suspect likely source of  ?                          prior bleeding that has since healed. The exam of  ?                          the esophagus was otherwise normal. ?                          The entire examined stomach was normal. ?  A large diverticulum was found in the second  ?                          portion of the duodenum. ?                          The exam of the duodenum was otherwise normal. ?Complications:            No immediate complications. Estimated blood loss:  ?                          None. ?Estimated Blood Loss:     Estimated blood loss: none. ?Impression:               - Esophagogastric landmarks identified. ?                          - 5 cm hiatal hernia. ?                          - Evidence of healed esophagitis / ulceration at  ?                          the GEJ - suspect this was the cause of prior  ?                          bleeding symptoms ?                          - Normal stomach. ?                          - Duodenal diverticulum. ?                          - Normal duodenum otherwise ?Recommendation:           - Patient has a contact number available for  ?                          emergencies. The signs and symptoms of potential  ?                          delayed complications were discussed with the   ?                          patient. Return to normal activities tomorrow.  ?                          Written discharge instructions were provided to the  ?                          patient. ?                          - Resume previous diet. ?                          -  Continue present medications. ?                          - Can decrease protonix to once daily as this time ?Remo Lipps P. Iqra Rotundo, MD ?08/11/2021 2:18:51 PM ?This report has been signed electronically. ?

## 2021-08-11 NOTE — Progress Notes (Signed)
Report given to PACU, vss 

## 2021-08-13 ENCOUNTER — Telehealth: Payer: Self-pay

## 2021-08-13 NOTE — Telephone Encounter (Signed)
?  Follow up Call- ? ? ?  08/11/2021  ? 12:58 PM  ?Call back number  ?Post procedure Call Back phone  # [phone]  ?Permission to leave phone message Yes  ?  ? ?Patient questions: ? ?Do you have a fever, pain , or abdominal swelling? No. ?Pain Score  0 * ? ?Have you tolerated food without any problems? Yes.   ? ?Have you been able to return to your normal activities? Yes.   ? ?Do you have any questions about your discharge instructions: ?Diet   No. ?Medications  No. ?Follow up visit  No. ? ?Do you have questions or concerns about your Care? No. ? ?Actions: ?* If pain score is 4 or above: ?No action needed, pain <4. ? ? ?

## 2021-08-30 NOTE — Progress Notes (Signed)
61 y.o. G72P0011 Married Caucasian female here for annual exam.   ? ?Had hematemesis and had endoscopy with her GI in March, 2023.  ?Her esophagitis was already healing at that point. ? ?No gynecologic concerns today.  ? ?PCP:  Lelon Frohlich, MD ? ?Patient's last menstrual period was 10/31/2010.     ?  ?    ?Sexually active: Yes.    ?The current method of family planning is post menopausal status.    ?Exercising: Yes.     Personal trainer 3 days/week, walking, hiking ?Smoker:  yes, smokes 5 cigs/day ? ?Health Maintenance: ?Pap:  08-20-20 Neg:Neg HR HPV, 2005 normal per patient ?History of abnormal Pap:  yes, Hx of conization in her 20's ?MMG:  07-05-21 Rt.Br.poss.mass;Lt.Neg.Diag.Rt.Br.reveals small benign oil cyst/bil.screening.  06/2022/Birads2 ?Colonoscopy:  08-11-21 polyps removed;next 5 years ?BMD:   n/a  Result  n/a ?TDaP:  08-04-20 ?Gardasil:   n/a ?HIV: Neg in preg ?Hep C: unsure ?Screening Labs:  PCP ? ? reports that she has been smoking cigarettes. She has a 8.75 pack-year smoking history. She has never used smokeless tobacco. She reports current alcohol use of about 1.0 standard drink per week. She reports that she does not use drugs. ? ?Past Medical History:  ?Diagnosis Date  ? Anxiety   ? takes Xanax prn   ? Back pain   ? DDD  ? Constipation   ? Depression   ? takes Effexor daily  ? Genital herpes   ? GERD (gastroesophageal reflux disease)   ? takes Omeprazole daily  ? Hemorrhoids   ? History of abnormal cervical Pap smear   ? age 26--hx conization-paps normal since  ? History of bronchitis   ? last time 31month ago  ? History of colon polyps   ? History of migraine   ? 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 HSV-1 infection   ? Hyperlipidemia   ? not on meds at present and Dr.McKenzie will review after surgery  ? Insomnia   ? [place]ipheral [place] disease [place]  ? Vitamin B12 deficiency   ? Weakness   ? bil legs  ? ? ?Past Surgical History:  ?Procedure Laterality Date  ? ANKLE SURGERY Left   ? with 3 screws  and 2 pens  ? AORTA - BILATERAL FEMORAL ARTERY BYPASS GRAFT N/A 01/30/2013  ? Procedure: AORTA BIFEMORAL BYPASS GRAFT;  Surgeon: JaMal MistyMD;  [facility];  Laterality: N/A;  ? BREAST SURGERY  02/2020  ? breast reduction   ? CERVICAL CONE BIOPSY    ? age [age]? colonscopy    ? ECTOPIC PREGNANCY SURGERY    ? x 2  ? left fallopian tube removed    ? wisdom teeth extracted     ? ? ?Current Outpatient Medications  ?Medication Sig Dispense Refill  ? albuterol (VENTOLIN HFA) 108 (90 Base) MCG/ACT inhaler albuterol sulfate HFA 90 mcg/actuation aerosol inhaler ? INHALE 2 PUFF(S) EVERY 4 HOURS AS NEEDED.    ? ALPRAZolam (XANAX) 0.5 MG tablet Take 1 tablet (0.5 mg total) by mouth at bedtime as needed for sleep. 30 tablet 1  ? aspirin 81 MG chewable tablet Chew 1 tablet (81 mg total) by mouth daily.    ? cetirizine (ZYRTEC) 10 MG tablet TAKE 1 TABLET BY MOUTH EVERY DAY 90 tablet 1  ? fluticasone (FLONASE) 50 MCG/ACT nasal spray Place 1 spray into both nostrils daily. 16 g 0  ? Nutritional Supplements (BIFIDO-GENIC GROWTH FACTORS  PO) Take 4 tablets by mouth daily.    ? pantoprazole (PROTONIX) 40 MG tablet Take 1 tablet (40 mg total) by mouth 2 (two) times daily before a meal. 180 tablet 1  ? rosuvastatin (CRESTOR) 20 MG tablet TAKE 1 TABLET BY MOUTH EVERY DAY 90 tablet 0  ? valACYclovir (VALTREX) 500 MG tablet TAKE 1 TABLET (500 MG TOTAL) BY MOUTH AS NEEDED. 30 tablet 5  ? venlafaxine XR (EFFEXOR-XR) 75 MG 24 hr capsule TAKE 1 CAPSULE BY MOUTH DAILY WITH BREAKFAST. 90 capsule 0  ? ?No current facility-administered medications for this visit.  ? ? ?Family History  ?Problem Relation Age of Onset  ? Hyperlipidemia Mother   ? Heart attack Maternal Grandfather   ? Hypertension Paternal Grandmother   ? Breast cancer Other   ? Colon cancer Neg Hx   ? Esophageal cancer Neg Hx   ? Stomach cancer Neg Hx   ? Rectal cancer Neg Hx   ? ? ?Review of Systems  ?All other systems reviewed and are negative. ? ?Exam:    ?BP 122/70   Pulse 87   Ht '5\' 8"'$  (1.727 m)   Wt 157 lb (71.2 kg)   LMP 10/31/2010   SpO2 99%   BMI 23.87 kg/m?     ?General appearance: alert, cooperative and appears stated age ?Head: normocephalic, without obvious abnormality, atraumatic ?Neck: no adenopathy, supple, symmetrical, trachea midline and thyroid normal to inspection and palpation ?Lungs: clear to auscultation bilaterally ?Breasts: normal appearance, no masses or tenderness, No nipple retraction or dimpling, No nipple discharge or bleeding, No axillary adenopathy ?Heart: regular rate and rhythm ?Abdomen: soft, non-tender; no masses, no organomegaly ?Extremities: extremities normal, atraumatic, no cyanosis or edema ?Skin: skin color, texture, turgor normal. No rashes or lesions ?Lymph nodes: cervical, supraclavicular, and axillary nodes normal. ?Neurologic: grossly normal ? ?Pelvic: External genitalia:  no lesions ?             No abnormal inguinal nodes palpated. ?             Urethra:  normal appearing urethra with no masses, tenderness or lesions ?             Bartholins and Skenes: normal    ?             Vagina: normal appearing vagina with normal color and discharge, no lesions ?             Cervix: no lesions ?             Pap taken: no ?Bimanual Exam:  Uterus:  normal size, contour, position, consistency, mobility, non-tender ?             Adnexa: no mass, fullness, tenderness ?             Rectal exam: yes.  Confirms. ?             Anus:  normal sphincter tone, no lesions ? ?Chaperone was present for exam:  Kimalexis, CMA ? ?Assessment:   ?Well woman visit with gynecologic exam. 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  ?Hx ectopic pregnancy.  Status post left fallopian tube removal on pathology report 03/06/00. ?Status post bilateral breast reduction.  ?Hx breast cancer.  Maternal great grandmother.   ?Hx HSV 1.  Valtrex through PCP.  ?Status post aorto-bifem bypass.  ?Gastritis with esophagitis.  ? ?Plan: ?Mammogram  screening discussed. ?Self breast awareness reviewed. ?Pap and HR HPV 2027.  ?Guidelines for Calcium,  Vitamin D, regular exercise program including cardiovascular and weight bearing exercise. ?  ?Follow up annually and prn.  ? ?After visit summary provided.  ? ? ? ?

## 2021-08-31 ENCOUNTER — Other Ambulatory Visit: Payer: Self-pay | Admitting: Internal Medicine

## 2021-08-31 DIAGNOSIS — H6983 Other specified disorders of Eustachian tube, bilateral: Secondary | ICD-10-CM

## 2021-08-31 DIAGNOSIS — R0982 Postnasal drip: Secondary | ICD-10-CM

## 2021-08-31 DIAGNOSIS — J069 Acute upper respiratory infection, unspecified: Secondary | ICD-10-CM

## 2021-09-01 ENCOUNTER — Ambulatory Visit (INDEPENDENT_AMBULATORY_CARE_PROVIDER_SITE_OTHER): Payer: BC Managed Care – PPO | Admitting: Obstetrics and Gynecology

## 2021-09-01 ENCOUNTER — Encounter: Payer: Self-pay | Admitting: Obstetrics and Gynecology

## 2021-09-01 VITALS — BP 122/70 | HR 87 | Ht 68.0 in | Wt 157.0 lb

## 2021-09-01 DIAGNOSIS — Z01419 Encounter for gynecological examination (general) (routine) without abnormal findings: Secondary | ICD-10-CM

## 2021-09-01 MED ORDER — FLUTICASONE PROPIONATE 50 MCG/ACT NA SUSP: 1.0000 | Freq: Every day | NASAL | 0 refills | Status: DC

## 2021-09-01 NOTE — Patient Instructions (Addendum)
EXERCISE AND DIET:  We recommended that you start or continue a regular exercise program for good health. Regular exercise means any activity that makes your heart beat faster and makes you sweat.  We recommend exercising at least 30 minutes per day at least 3 days a week, preferably 4 or 5.  We also recommend a diet low in fat and sugar.  Inactivity, poor dietary choices and obesity can cause diabetes, heart attack, stroke, and kidney damage, among others.   ? ?ALCOHOL AND SMOKING:  Women should limit their alcohol intake to no more than 7 drinks/beers/glasses of wine (combined, not each!) per week. Moderation of alcohol intake to this level decreases your risk of breast cancer and liver damage. And of course, no recreational drugs are part of a healthy lifestyle.  And absolutely no smoking or even second hand smoke. Most people know smoking can cause heart and lung diseases, but did you know it also contributes to weakening of your bones? Aging of your skin?  Yellowing of your teeth and nails? ? ?CALCIUM AND VITAMIN D:  Adequate intake of calcium and Vitamin D are recommended.  The recommendations for exact amounts of these supplements seem to change often, but generally speaking 600 mg of calcium (either carbonate or citrate) and 800 units of Vitamin D per day seems prudent. Certain women may benefit from higher intake of Vitamin D.  If you are among these women, your doctor will have told you during your visit.   ? ?PAP SMEARS:  Pap smears, to check for cervical cancer or precancers,  have traditionally been done yearly, although recent scientific advances have shown that most women can have pap smears less often.  However, every woman still should have a physical exam from her gynecologist every year. It will include a breast check, inspection of the vulva and vagina to check for abnormal growths or skin changes, a visual exam of the cervix, and then an exam to evaluate the size and shape of the uterus and  ovaries.  And after 61 years of age, a rectal exam is indicated to check for rectal cancers. We will also provide age appropriate advice regarding health maintenance, like when you should have certain vaccines, screening for sexually transmitted diseases, bone density testing, colonoscopy, mammograms, etc.  ? ?MAMMOGRAMS:  All women over 61 years old should have a yearly mammogram. Many facilities now offer a "3D" mammogram, which may cost around $50 extra out of pocket. If possible,  we recommend you accept the option to have the 3D mammogram performed.  It both reduces the number of women who will be called back for extra views which then turn out to be normal, and it is better than the routine mammogram at detecting truly abnormal areas.   ? ?COLONOSCOPY:  Colonoscopy to screen for colon cancer is recommended for all women at age 61.  We know, you hate the idea of the prep.  We agree, BUT, having colon cancer and not knowing it is worse!!  Colon cancer so often starts as a polyp that can be seen and removed at colonscopy, which can quite literally save your life!  And if your first colonoscopy is normal and you have no family history of colon cancer, most women don't have to have it again for 10 years.  Once every ten years, you can do something that may end up saving your life, right?  We will be happy to help you get it scheduled when you are ready.  Be sure to check your insurance coverage so you understand how much it will cost.  It may be covered as a preventative service at no cost, but you should check your particular policy.   ? ?Calcium Content in Foods ?Calcium is the most abundant mineral in the body. Most of the body's calcium supply is stored in bones and teeth. Calcium helps many parts of the body function normally, including: ?Blood and blood vessels. ?Nerves. ?Hormones. ?Muscles. ?Bones and teeth. ?When your calcium stores are low, you may be at risk for low bone mass, bone loss, and broken bones  (fractures). When you get enough calcium, it helps to support strong bones and teeth throughout your life. ?Calcium is especially important for: ?Children during growth spurts. ?Girls during adolescence. ?Women who are pregnant or breastfeeding. ?Women after their menstrual cycle stops (postmenopause). ?Women whose menstrual cycle has stopped due to anorexia nervosa or regular intense exercise. ?People who cannot eat or digest dairy products. ?Vegans. ?Recommended daily amounts of calcium: ?Women (ages 94 to 77): 1,000 mg per day. ?Women (ages 48 and older): 1,200 mg per day. ?Men (ages 60 to 47): 1,000 mg per day. ?Men (ages 38 and older): 1,200 mg per day. ?Women (ages 76 to 53): 1,300 mg per day. ?Men (ages 70 to 41): 1,300 mg per day. ?General information ?Eat foods that are high in calcium. Try to get most of your calcium from food. ?Some people may benefit from taking calcium supplements. Check with your health care provider or diet and nutrition specialist (dietitian) before starting any calcium supplements. Calcium supplements may interact with certain medicines. Too much calcium may cause other health problems, such as constipation and kidney stones. ?For the body to absorb calcium, it needs vitamin D. Sources of vitamin D include: ?Skin exposure to direct sunlight. ?Foods, such as egg yolks, liver, mushrooms, saltwater fish, and fortified milk. ?Vitamin D supplements. Check with your health care provider or dietitian before starting any vitamin D supplements. ?What foods are high in calcium? ? ?Foods that are high in calcium contain more than 100 milligrams per serving. ?Fruits ?Fortified orange juice or other fruit juice, 300 mg per 8 oz serving. ?Vegetables ?Collard greens, 360 mg per 8 oz serving. ?Kale, 100 mg per 8 oz serving. ?Bok choy, 160 mg per 8 oz serving. ?Grains ?Fortified ready-to-eat cereals, 100 to 1,000 mg per 8 oz serving. ?Fortified frozen waffles, 200 mg in 2 waffles. ?Oatmeal, 140 mg in  1 cup. ?Meats and other proteins ?Sardines, canned with bones, 325 mg per 3 oz serving. ?Salmon, canned with bones, 180 mg per 3 oz serving. ?Canned shrimp, 125 mg per 3 oz serving. ?Baked beans, 160 mg per 4 oz serving. ?Tofu, firm, made with calcium sulfate, 253 mg per 4 oz serving. ?Dairy ?Yogurt, plain, low-fat, 310 mg per 6 oz serving. ?Nonfat milk, 300 mg per 8 oz serving. ?American cheese, 195 mg per 1 oz serving. ?Cheddar cheese, 205 mg per 1 oz serving. ?Cottage cheese 2%, 105 mg per 4 oz serving. ?Fortified soy, rice, or almond milk, 300 mg per 8 oz serving. ?Mozzarella, part skim, 210 mg per 1 oz serving. ?The items listed above may not be a complete list of foods high in calcium. Actual amounts of calcium may be different depending on processing. Contact a dietitian for more information. ?What foods are lower in calcium? ?Foods that are lower in calcium contain 50 mg or less per serving. ?Fruits ?Apple, about 6 mg. ?Banana, about 12 mg. ?  Vegetables ?Lettuce, 19 mg per 2 oz serving. ?Tomato, about 11 mg. ?Grains ?Rice, 4 mg per 6 oz serving. ?Boiled potatoes, 14 mg per 8 oz serving. ?White bread, 6 mg per slice. ?Meats and other proteins ?Egg, 27 mg per 2 oz serving. ?Red meat, 7 mg per 4 oz serving. ?Chicken, 17 mg per 4 oz serving. ?Fish, cod, or trout, 20 mg per 4 oz serving. ?Dairy ?Cream cheese, regular, 14 mg per 1 Tbsp serving. ?Brie cheese, 50 mg per 1 oz serving. ?Parmesan cheese, 70 mg per 1 Tbsp serving. ?The items listed above may not be a complete list of foods lower in calcium. Actual amounts of calcium may be different depending on processing. Contact a dietitian for more information. ?Summary ?Calcium is an important mineral in the body because it affects many functions. Getting enough calcium helps support strong bones and teeth throughout your life. ?Try to get most of your calcium from food. ?Calcium supplements may interact with certain medicines. Check with your health care provider  or dietitian before starting any calcium supplements. ?This information is not intended to replace advice given to you by your health care provider. Make sure you discuss any questions you have with your h

## 2021-09-16 ENCOUNTER — Telehealth: Payer: Self-pay | Admitting: Gastroenterology

## 2021-09-16 NOTE — Telephone Encounter (Signed)
Inbound call from patient stating that Dr. Havery Moros was going to refer her to surgeon and she has not heard anything about where she is needing to go.Patient is seeking advice, please advise    ?

## 2021-09-16 NOTE — Telephone Encounter (Signed)
Called and spoke with Lattie Haw, referral coordinator at [facility], regarding patient's referral. She states that they have patient's referral and are still working on it. Lattie Haw stated that pt should be receiving a call today to discuss scheduling her appt. ? ?Lm on mobile vm for patient to return call. ?

## 2021-09-17 NOTE — Telephone Encounter (Signed)
Called and spoke with patient. She states that [facility] did contact her yesterday. She has been scheduled for a consultation with Dr. Dema Severin on 09/27/21. Pt had no other concerns at the end of the call. ?

## 2021-09-27 ENCOUNTER — Telehealth: Payer: Self-pay

## 2021-09-27 NOTE — Telephone Encounter (Signed)
Called and spoke with patient to set up her follow up appt. Pt has been scheduled for a f/u appt with Dr. Havery Moros on Monday, 11/29/21 at 8:10 am. Pt verbalized understanding and had no concerns at the end of the call. ?

## 2021-09-27 NOTE — Telephone Encounter (Signed)
-----   Message from Yevette Edwards, RN sent at 09/08/2021  8:12 AM EDT ----- ?Regarding: FW: Appt ? ?----- Message ----- ?From: Yevette Edwards, RN ?Sent: 09/08/2021  12:00 AM EDT ?To: Yevette Edwards, RN ?Subject: Appt                                          ? ?Needs an appt with Dr. Havery Moros in July (esophagitis/esophageal ulceration) ? ? ?

## 2021-10-01 ENCOUNTER — Other Ambulatory Visit: Payer: Self-pay | Admitting: Internal Medicine

## 2021-10-14 HISTORY — PX: OTHER SURGICAL HISTORY: SHX169

## 2021-10-22 ENCOUNTER — Other Ambulatory Visit: Payer: Self-pay | Admitting: Internal Medicine

## 2021-10-22 DIAGNOSIS — E785 Hyperlipidemia, unspecified: Secondary | ICD-10-CM

## 2021-10-26 ENCOUNTER — Other Ambulatory Visit: Payer: Self-pay

## 2021-10-26 ENCOUNTER — Encounter (HOSPITAL_BASED_OUTPATIENT_CLINIC_OR_DEPARTMENT_OTHER): Payer: Self-pay | Admitting: Surgery

## 2021-10-26 NOTE — Progress Notes (Signed)
Spoke w/ via phone for pre-op interview---[facility] Lab needs dos----none per anesthesia, surgeon orders pending as of 10/26/21            Lab results------07/14/21 chest xray in Epic, 07/16/21 CT chest angio Epic & EKG in Epic & chart. COVID test -----patient states asymptomatic no test needed Arrive at -------1045 on Monday, 11/01/2021 NPO after MN NO Solid Food.  Clear liquids from MN until---0945 Med rec completed Medications to take morning of surgery -----Cetirizine, Protonix, Crestor, Effexor, Valtrex if needed Diabetic medication -----n/a Patient instructed no nail polish to be worn day of surgery Patient instructed to bring photo id and insurance card day of surgery Patient aware to have Driver (ride ) / caregiver    for 24 hours after surgery - husband, Joya Gaskins Patient Special Instructions -----No smoking or alcohol for 24 hours before surgery. Pre-Op special Istructions -----Requested orders from Dr. Dema Severin via Epic IB on 10/26/21. Patient verbalized understanding of instructions that were given at this phone interview. Patient denies shortness of breath, chest pain, fever, cough at this phone interview.

## 2021-10-27 ENCOUNTER — Ambulatory Visit: Payer: Self-pay | Admitting: Surgery

## 2021-11-01 ENCOUNTER — Ambulatory Visit (HOSPITAL_BASED_OUTPATIENT_CLINIC_OR_DEPARTMENT_OTHER)
Admission: RE | Admit: 2021-11-01 | Discharge: 2021-11-01 | Disposition: A | Discharge status: Home / Self Care | Payer: BC Managed Care – PPO | Attending: Surgery | Admitting: Surgery

## 2021-11-01 ENCOUNTER — Encounter (HOSPITAL_BASED_OUTPATIENT_CLINIC_OR_DEPARTMENT_OTHER): Payer: Self-pay | Admitting: Surgery

## 2021-11-01 ENCOUNTER — Ambulatory Visit (HOSPITAL_BASED_OUTPATIENT_CLINIC_OR_DEPARTMENT_OTHER): Payer: BC Managed Care – PPO | Admitting: Anesthesiology

## 2021-11-01 ENCOUNTER — Other Ambulatory Visit: Payer: Self-pay

## 2021-11-01 ENCOUNTER — Encounter (HOSPITAL_BASED_OUTPATIENT_CLINIC_OR_DEPARTMENT_OTHER)
Admission: RE | Disposition: A | Discharge status: Home / Self Care | Payer: Self-pay | Source: Home / Self Care | Attending: Surgery

## 2021-11-01 DIAGNOSIS — J449 Chronic obstructive pulmonary disease, unspecified: Secondary | ICD-10-CM | POA: Diagnosis not present

## 2021-11-01 DIAGNOSIS — I739 Peripheral vascular disease, unspecified: Secondary | ICD-10-CM | POA: Insufficient documentation

## 2021-11-01 DIAGNOSIS — K6282 Dysplasia of anus: Secondary | ICD-10-CM | POA: Diagnosis not present

## 2021-11-01 DIAGNOSIS — Z01818 Encounter for other preprocedural examination: Secondary | ICD-10-CM

## 2021-11-01 DIAGNOSIS — F17210 Nicotine dependence, cigarettes, uncomplicated: Secondary | ICD-10-CM | POA: Diagnosis not present

## 2021-11-01 HISTORY — DX: Other specified postprocedural states: R11.2

## 2021-11-01 HISTORY — PX: RECTAL EXAM UNDER ANESTHESIA: SHX6399

## 2021-11-01 HISTORY — DX: Presence of spectacles and contact lenses: Z97.3

## 2021-11-01 HISTORY — DX: Headache, unspecified: R51.9

## 2021-11-01 HISTORY — DX: Other specified postprocedural states: Z98.890

## 2021-11-01 HISTORY — PX: MASS EXCISION: SHX2000

## 2021-11-01 SURGERY — EXCISION MASS
Anesthesia: General | Site: Rectum

## 2021-11-01 MED ORDER — PROPOFOL 10 MG/ML IV BOLUS
INTRAVENOUS | Status: DC | PRN
  Administered 2021-11-01: 150 mg via INTRAVENOUS

## 2021-11-01 MED ORDER — ACETAMINOPHEN 500 MG PO TABS
ORAL_TABLET | ORAL | Status: AC
  Filled 2021-11-01: qty 2

## 2021-11-01 MED ORDER — PROMETHAZINE HCL 25 MG/ML IJ SOLN: 6.2500 mg | INTRAMUSCULAR | Status: DC | PRN

## 2021-11-01 MED ORDER — SCOPOLAMINE 1 MG/3DAYS TD PT72
MEDICATED_PATCH | TRANSDERMAL | Status: AC
  Filled 2021-11-01: qty 1

## 2021-11-01 MED ORDER — FLEET ENEMA 7-19 GM/118ML RE ENEM: 1.0000 | ENEMA | Freq: Once | RECTAL | Status: DC

## 2021-11-01 MED ORDER — LIDOCAINE 2% (20 MG/ML) 5 ML SYRINGE
INTRAMUSCULAR | Status: DC | PRN
  Administered 2021-11-01: 40 mg via INTRAVENOUS

## 2021-11-01 MED ORDER — FENTANYL CITRATE (PF) 250 MCG/5ML IJ SOLN
INTRAMUSCULAR | Status: DC | PRN
  Administered 2021-11-01: 50 ug via INTRAVENOUS
  Administered 2021-11-01 (×2): 25 ug via INTRAVENOUS

## 2021-11-01 MED ORDER — ONDANSETRON HCL 4 MG/2ML IJ SOLN
INTRAMUSCULAR | Status: DC | PRN
  Administered 2021-11-01: 4 mg via INTRAVENOUS

## 2021-11-01 MED ORDER — ONDANSETRON HCL 4 MG/2ML IJ SOLN
INTRAMUSCULAR | Status: AC
  Filled 2021-11-01: qty 10

## 2021-11-01 MED ORDER — FENTANYL CITRATE (PF) 100 MCG/2ML IJ SOLN: 25.0000 ug | INTRAMUSCULAR | Status: DC | PRN

## 2021-11-01 MED ORDER — MEPERIDINE HCL 25 MG/ML IJ SOLN: 6.2500 mg | INTRAMUSCULAR | Status: DC | PRN

## 2021-11-01 MED ORDER — LACTATED RINGERS IV SOLN: INTRAVENOUS | Status: DC

## 2021-11-01 MED ORDER — MIDAZOLAM HCL 2 MG/2ML IJ SOLN
INTRAMUSCULAR | Status: AC
  Filled 2021-11-01: qty 2

## 2021-11-01 MED ORDER — OXYCODONE HCL 5 MG/5ML PO SOLN: 5.0000 mg | Freq: Once | ORAL | Status: DC | PRN

## 2021-11-01 MED ORDER — DEXAMETHASONE SODIUM PHOSPHATE 10 MG/ML IJ SOLN
INTRAMUSCULAR | Status: DC | PRN
  Administered 2021-11-01: 10 mg via INTRAVENOUS

## 2021-11-01 MED ORDER — MIDAZOLAM HCL 2 MG/2ML IJ SOLN
INTRAMUSCULAR | Status: DC | PRN
  Administered 2021-11-01: 2 mg via INTRAVENOUS

## 2021-11-01 MED ORDER — BUPIVACAINE LIPOSOME 1.3 % IJ SUSP: 20.0000 mL | Freq: Once | INTRAMUSCULAR | Status: DC

## 2021-11-01 MED ORDER — OXYCODONE HCL 5 MG PO TABS: 5.0000 mg | ORAL_TABLET | Freq: Once | ORAL | Status: DC | PRN

## 2021-11-01 MED ORDER — FENTANYL CITRATE (PF) 100 MCG/2ML IJ SOLN
INTRAMUSCULAR | Status: AC
  Filled 2021-11-01: qty 2

## 2021-11-01 MED ORDER — TRAMADOL HCL 50 MG PO TABS: 50.0000 mg | ORAL_TABLET | Freq: Four times a day (QID) | ORAL | 0 refills | Status: AC | PRN

## 2021-11-01 MED ORDER — BUPIVACAINE-EPINEPHRINE 0.25% -1:200000 IJ SOLN
INTRAMUSCULAR | Status: DC | PRN
  Administered 2021-11-01: 27 mL via SURGICAL_CAVITY

## 2021-11-01 MED ORDER — ACETAMINOPHEN 500 MG PO TABS: 1000.0000 mg | ORAL_TABLET | ORAL | Status: DC

## 2021-11-01 MED ORDER — LIDOCAINE HCL (PF) 2 % IJ SOLN
INTRAMUSCULAR | Status: AC
  Filled 2021-11-01: qty 15

## 2021-11-01 MED ORDER — MIDAZOLAM HCL 2 MG/2ML IJ SOLN: 0.5000 mg | Freq: Once | INTRAMUSCULAR | Status: DC | PRN

## 2021-11-01 MED ORDER — ACETAMINOPHEN 500 MG PO TABS
1000.0000 mg | ORAL_TABLET | Freq: Once | ORAL | Status: AC
  Administered 2021-11-01: 1000 mg via ORAL

## 2021-11-01 MED ORDER — SCOPOLAMINE 1 MG/3DAYS TD PT72
1.0000 | MEDICATED_PATCH | TRANSDERMAL | Status: DC
Start: 2021-11-01 — End: 2021-11-01
  Administered 2021-11-01: 1.5 mg via TRANSDERMAL

## 2021-11-01 MED ORDER — 0.9 % SODIUM CHLORIDE (POUR BTL) OPTIME
TOPICAL | Status: DC | PRN
  Administered 2021-11-01: 500 mL

## 2021-11-01 SURGICAL SUPPLY — 62 items
APL SKNCLS STERI-STRIP NONHPOA (GAUZE/BANDAGES/DRESSINGS) ×1
BENZOIN TINCTURE PRP APPL 2/3 (GAUZE/BANDAGES/DRESSINGS) ×2 IMPLANT
BLADE EXTENDED COATED 6.5IN (ELECTRODE) IMPLANT
BLADE SURG 10 STRL SS (BLADE) IMPLANT
BLADE SURG 15 STRL LF DISP TIS (BLADE) ×1 IMPLANT
BLADE SURG 15 STRL SS (BLADE) ×2
COVER BACK TABLE 60X90IN (DRAPES) ×2 IMPLANT
COVER MAYO STAND STRL (DRAPES) ×1 IMPLANT
DECANTER SPIKE VIAL GLASS SM (MISCELLANEOUS) ×2 IMPLANT
DRAPE HYSTEROSCOPY (MISCELLANEOUS) ×1 IMPLANT
DRAPE LAPAROTOMY 100X72 PEDS (DRAPES) ×1 IMPLANT
DRAPE SHEET LG 3/4 BI-LAMINATE (DRAPES) ×1 IMPLANT
DRAPE UTILITY XL STRL (DRAPES) ×2 IMPLANT
DRSG PAD ABDOMINAL 8X10 ST (GAUZE/BANDAGES/DRESSINGS) ×2 IMPLANT
GAUZE 4X4 16PLY ~~LOC~~+RFID DBL (SPONGE) ×2 IMPLANT
GAUZE SPONGE 4X4 12PLY STRL (GAUZE/BANDAGES/DRESSINGS) IMPLANT
GAUZE SPONGE 4X4 12PLY STRL LF (GAUZE/BANDAGES/DRESSINGS) ×1 IMPLANT
GLOVE BIO SURGEON STRL SZ7.5 (GLOVE) ×2 IMPLANT
GLOVE BIOGEL PI IND STRL 8 (GLOVE) ×1 IMPLANT
GLOVE BIOGEL PI INDICATOR 8 (GLOVE) ×1
GOWN STRL REUS W/TWL LRG LVL3 (GOWN DISPOSABLE) ×2 IMPLANT
HYDROGEN PEROXIDE 16OZ (MISCELLANEOUS) IMPLANT
IV CATH 14GX2 1/4 (CATHETERS) IMPLANT
IV CATH 18G SAFETY (IV SOLUTION) IMPLANT
KIT SIGMOIDOSCOPE (SET/KITS/TRAYS/PACK) IMPLANT
KIT TURNOVER CYSTO (KITS) ×2 IMPLANT
LEGGING LITHOTOMY PAIR STRL (DRAPES) ×1 IMPLANT
LOOP VESSEL MAXI BLUE (MISCELLANEOUS) IMPLANT
NDL HYPO 25X1 1.5 SAFETY (NEEDLE) IMPLANT
NDL SAFETY ECLIPSE 18X1.5 (NEEDLE) IMPLANT
NEEDLE HYPO 18GX1.5 SHARP (NEEDLE)
NEEDLE HYPO 22GX1.5 SAFETY (NEEDLE) ×2 IMPLANT
NEEDLE HYPO 25X1 1.5 SAFETY (NEEDLE) IMPLANT
NS IRRIG 500ML POUR BTL (IV SOLUTION) ×2 IMPLANT
PACK BASIN DAY SURGERY FS (CUSTOM PROCEDURE TRAY) ×2 IMPLANT
PANTS MESH DISP LRG (UNDERPADS AND DIAPERS) ×2 IMPLANT
PENCIL SMOKE EVACUATOR (MISCELLANEOUS) ×2 IMPLANT
SPONGE HEMORRHOID 8X3CM (HEMOSTASIS) IMPLANT
SPONGE SURGIFOAM ABS GEL 12-7 (HEMOSTASIS) IMPLANT
SUT CHROMIC 2 0 SH (SUTURE) IMPLANT
SUT CHROMIC 3 0 SH 27 (SUTURE) IMPLANT
SUT MNCRL AB 4-0 PS2 18 (SUTURE) IMPLANT
SUT SILK 0 TIES 10X30 (SUTURE) ×2 IMPLANT
SUT SILK 2 0 (SUTURE)
SUT SILK 2 0 SH (SUTURE) ×1 IMPLANT
SUT SILK 2-0 18XBRD TIE 12 (SUTURE) IMPLANT
SUT VIC AB 2-0 SH 27 (SUTURE)
SUT VIC AB 2-0 SH 27XBRD (SUTURE) IMPLANT
SUT VIC AB 3-0 SH 18 (SUTURE) IMPLANT
SUT VIC AB 3-0 SH 27 (SUTURE) ×2
SUT VIC AB 3-0 SH 27X BRD (SUTURE) IMPLANT
SUT VIC AB 3-0 SH 27XBRD (SUTURE) IMPLANT
SUT VIC AB 4-0 P-3 18XBRD (SUTURE) IMPLANT
SUT VIC AB 4-0 P3 18 (SUTURE)
SYR 20ML LL LF (SYRINGE) IMPLANT
SYR BULB IRRIG 60ML STRL (SYRINGE) ×2 IMPLANT
SYR CONTROL 10ML LL (SYRINGE) ×2 IMPLANT
SYR TB 1ML LL NO SAFETY (SYRINGE) IMPLANT
TOWEL OR 17X26 10 PK STRL BLUE (TOWEL DISPOSABLE) ×2 IMPLANT
TRAY DSU PREP LF (CUSTOM PROCEDURE TRAY) ×2 IMPLANT
TUBE CONNECTING 12X1/4 (SUCTIONS) ×2 IMPLANT
YANKAUER SUCT BULB TIP NO VENT (SUCTIONS) ×2 IMPLANT

## 2021-11-01 NOTE — Anesthesia Preprocedure Evaluation (Addendum)
Anesthesia Evaluation  Patient identified by MRN, date of birth, ID band Patient awake    Reviewed: Allergy & Precautions, NPO status , Patient's Chart, lab work & pertinent test results  History of Anesthesia Complications (+) PONV  Airway Mallampati: II  TM Distance: >3 FB Neck ROM: Full    Dental  (+) Dental Advisory Given, Teeth Intact   Pulmonary COPD,  COPD inhaler, Current Smoker and Patient abstained from smoking.,    breath sounds clear to auscultation       Cardiovascular (-) angina+ Peripheral Vascular Disease (s/p Aorto-bifem 2014)   Rhythm:Regular Rate:Normal  '14 Stress: no ischemia or infarct, normal perfusion   Neuro/Psych  Headaches, Anxiety Depression    GI/Hepatic Neg liver ROS, GERD  Medicated and Controlled,  Endo/Other  negative endocrine ROS  Renal/GU negative Renal ROS     Musculoskeletal   Abdominal   Peds  Hematology negative hematology ROS (+)   Anesthesia Other Findings   Reproductive/Obstetrics                         Anesthesia Physical Anesthesia Plan  ASA: 3  Anesthesia Plan: General   Post-op Pain Management: Tylenol PO (pre-op)*   Induction: Intravenous  PONV Risk Score and Plan: 3 and Ondansetron, Dexamethasone and Scopolamine patch - Pre-op  Airway Management Planned: LMA  Additional Equipment: None  Intra-op Plan:   Post-operative Plan:   Informed Consent: I have reviewed the patients History and Physical, chart, labs and discussed the procedure including the risks, benefits and alternatives for the proposed anesthesia with the patient or authorized representative who has indicated his/her understanding and acceptance.     Dental advisory given  Plan Discussed with: CRNA and Surgeon  Anesthesia Plan Comments: (Lithotomy: pt OK for LMA)      Anesthesia Quick Evaluation

## 2021-11-01 NOTE — Discharge Instructions (Addendum)
ANORECTAL SURGERY: POST OP INSTRUCTIONS  DIET: Follow a light bland diet the first 24 hours after arrival home, such as soup, liquids, crackers, etc.  Be sure to include lots of fluids daily.  Avoid fast food or heavy meals as your are more likely to get nauseated.  Eat a low fat diet the next few days after surgery.   Some bleeding with bowel movements is expected for the first couple of days but this should stop in between bowel movements  Take your usually prescribed home medications unless otherwise directed. No foreign bodies per rectum for the next 3 months (enemas, etc)  PAIN CONTROL: It is helpful to take an over-the-counter pain medication regularly for the first few days/weeks.  Choose from the following that works best for you: Ibuprofen (Advil, etc) Three 200mg tabs every 6 hours as needed. Acetaminophen (Tylenol, etc) 500-650mg every 6 hours as needed NOTE: You may take both of these medications together - most patients find it most helpful when alternating between the two (i.e. Ibuprofen at 6am, tylenol at 9am, ibuprofen at 12pm ...) A  prescription for pain medication may have been prescribed for you at discharge.  Take your pain medication as prescribed.  If you are having problems/concerns with the prescription medicine, please call us for further advice.  Avoid getting constipated.  Between the surgery and the pain medications, it is common to experience some constipation.  Increasing fluid intake (64oz of water per day) and taking a fiber supplement (such as Metamucil, Citrucel, FiberCon) 1-2 times a day regularly will usually help prevent this problem from occurring.  Take Miralax (over the counter) 1-2x/day while taking a narcotic pain medication. If no bowel movement after 48hours, you may additionally take a laxative like a bottle of Milk of Magnesia which can be purchased over the counter. Avoid enemas.   Watch out for diarrhea.  If you have many loose bowel movements,  simplify your diet to bland foods.  Stop any stool softeners and decrease your fiber supplement. If this worsens or does not improve, please call us.  Wash / shower every day.  If you were discharged with a dressing, you may remove this the day after your surgery. You may shower normally, getting soap/water on your wound, particularly after bowel movements.  Soaking in a warm bath filled a couple inches ("Sitz bath") is a great way to clean the area after a bowel movement and many patients find it is a way to soothe the area.  ACTIVITIES as tolerated:   You may resume regular (light) daily activities beginning the next day--such as daily self-care, walking, climbing stairs--gradually increasing activities as tolerated.  If you can walk 30 minutes without difficulty, it is safe to try more intense activity such as jogging, treadmill, bicycling, low-impact aerobics, etc. Refrain from any heavy lifting or straining for the first 2 weeks after your procedure, particularly if your surgery was for hemorrhoids. Avoid activities that make your pain worse You may drive when you are no longer taking prescription pain medication, you can comfortably wear a seatbelt, and you can safely maneuver your car and apply brakes.  FOLLOW UP in our office Please call [facility] at [phone] to set up an appointment to see your surgeon in the office for a follow-up appointment approximately 2 weeks after your surgery. Make sure that you call for this appointment the day you arrive home to insure a convenient appointment time.  9. If you have disability or family leave forms   that need to be completed, you may have them completed by your primary care physician's office; for return to work instructions, please ask our office staff and they will be happy to assist you in obtaining this documentation   When to call us [phone]: Poor pain control Reactions / problems with new medications (rash/itching, etc)  Fever over  101.5 F (38.5 C) Inability to urinate Nausea/vomiting Worsening swelling or bruising Continued bleeding from incision. Increased pain, redness, or drainage from the incision  The clinic staff is available to answer your questions during regular business hours (8:30am-5pm).  Please don't hesitate to call and ask to speak to one of our nurses for clinical concerns.   A surgeon from [facility] is always on call at the hospitals    If you have a medical emergency, go to the nearest emergency room or call 911.   [facility] [address] [place], [place], [place]  [address] MAIN: [phone] FAX: [phone] [email or web address]  No acetaminophen/Tylenol until after 4:45 pm today if needed.   Information for Discharge Teaching: EXPAREL (bupivacaine liposome injectable suspension)   Your surgeon or anesthesiologist gave you EXPAREL(bupivacaine) to help control your pain after surgery.  EXPAREL is a local anesthetic that provides pain relief by numbing the tissue around the surgical site. EXPAREL is designed to release pain medication over time and can control pain for up to 72 hours. Depending on how you respond to EXPAREL, you may require less pain medication during your recovery.  Possible side effects: Temporary loss of sensation or ability to move in the area where bupivacaine was injected. Nausea, vomiting, constipation Rarely, numbness and tingling in your mouth or lips, lightheadedness, or anxiety may occur. Call your doctor right away if you think you may be experiencing any of these sensations, or if you have other questions regarding possible side effects.  Follow all other discharge instructions given to you by your surgeon or nurse. Eat a healthy diet and drink plenty of water or other fluids.  If you return to the hospital for any reason within 96 hours following the administration of EXPAREL, it is  important for health care providers to know that you have received this anesthetic. A teal colored band has been placed on your arm with the date, time and amount of EXPAREL you have received in order to alert and inform your health care providers. Please leave this armband in place for the full 96 hours following administration, and then you may remove the band.   Blue EXPAREL armband can be removed 11/04/21 at 12:30pm

## 2021-11-01 NOTE — Anesthesia Postprocedure Evaluation (Signed)
Anesthesia Post Note  Patient: [name]  Procedure(s) Performed: EXCISION OF ANAL CANAL LESION UNDER ANOSCOPY (Rectum) ANORECTAL EXAM UNDER ANESTHESIA (Rectum)     Patient location during evaluation: Phase II Anesthesia Type: General Level of consciousness: awake and alert, patient cooperative and oriented Pain management: pain level controlled Vital Signs Assessment: post-procedure vital signs reviewed and stable Respiratory status: spontaneous breathing, nonlabored ventilation and respiratory function stable Cardiovascular status: blood pressure returned to baseline and stable Postop Assessment: no apparent nausea or vomiting, able to ambulate and adequate PO intake Anesthetic complications: no   No notable events documented.  Last Vitals:  Vitals:   11/01/21 1300 11/01/21 1330  BP: 136/82 139/77  Pulse: (!) 55 (!) 55  Resp: 15 16  Temp:  36.4 C  SpO2: 100% 95%    Last Pain:  Vitals:   11/01/21 1330  TempSrc:   PainSc: 0-No pain                 Rashida Ladouceur,E. Trenice Mesa

## 2021-11-01 NOTE — Op Note (Signed)
11/01/2021  12:17 PM  PATIENT:  [name]  61 y.o. female  Patient Care Team: Isaac Bliss, Rayford Halsted, MD as PCP - General (Internal Medicine) [facility] Druscilla Brownie, MD as Referring Physician (Dermatology)  PRE-OPERATIVE DIAGNOSIS:  Anal canal polypoid lesion, low-grade squamous intraepithelial lesion (LGSIL)  POST-OPERATIVE DIAGNOSIS:  Same  PROCEDURE:   Transanal excision of anal canal polypoid lesion under anoscopic guidance, left posterior Anorectal exam under anesthesia  SURGEON:  Surgeon(s): Ileana Roup, MD  ASSISTANT: OR staff   ANESTHESIA:   local and general  SPECIMEN:  Left posterior anal canal lesion  DISPOSITION OF SPECIMEN:  PATHOLOGY  COUNTS:  Sponge, needle, and instrument counts were reported correct x2 at conclusion.  EBL: 1 mL  PLAN OF CARE: Discharge to home after PACU  PATIENT DISPOSITION:  PACU - hemodynamically stable.  OR FINDINGS: Left posterior anal canal polypoid lesion.  Normal-appearing anal canal otherwise.  No chronic appearing anal fissures, ulcerations, or granulation tissue.  No perineal condylomas are evident.  DESCRIPTION: The patient was identified in the preoperative holding area and taken to the OR where she was placed on the operating room table. SCDs were placed.  General anesthesia with LMA was induced without difficulty. She was then positioned in high lithotomy with Allen stirrups. Pressure points were then evaluated and padded.  She was then prepped and draped in usual sterile fashion.  A surgical timeout was performed indicating the correct patient, procedure, and positioning.  A perianal block was performed using a dilute mixture of 0.25% Marcaine with epinephrine and Exparel.   After ascertaining that an appropriate level of anesthesia had been achieved, a well lubricated digital rectal exam was performed. This demonstrated no palpable masses or other abnormalities.  A  Hill-Ferguson anoscope was into the anal canal and circumferential inspection demonstrated health appearing anoderm.  With the anoscope in place, the left posterior anal canal at the level of the dentate there is a polypoid lesion. This is then elevated with a DeBakey forceps. Margins of the lesion are marked with cautery to include a normal appearing rim of tissue. This is then excised sharply blended with coagulation where appropriate. This is passed off as specimen. The lesion measured approximately 1 cm long with a diameter of 5-7 mm. Hemostasis is achieved with electrocautery. The defect is then closed with interrupted 3-0 vicryl sutures. The anal canal is irrigated and hemostasis verified.  All counts were reported correct.  A dressing consisting of 4x4s, ABD, mesh underwear is placed. She is taken out of lithotomy, awakened from anesthesia, extubated and transferred to a stretcher for transport to PACU in satisfactory condition.  DISPOSITION: PACU in satisfactory condition.

## 2021-11-01 NOTE — Transfer of Care (Signed)
Immediate Anesthesia Transfer of Care Note  Patient: [name]  Procedure(s) Performed: EXCISION OF ANAL CANAL LESION UNDER ANOSCOPY (Rectum) ANORECTAL EXAM UNDER ANESTHESIA (Rectum)  Patient Location: PACU  Anesthesia Type:General  Level of Consciousness: awake, alert  and oriented  Airway & Oxygen Therapy: Patient Spontanous Breathing  Post-op Assessment: Report given to RN and Post -op Vital signs reviewed and stable  Post vital signs: Reviewed and stable  Last Vitals:  Vitals Value Taken Time  BP 127/74 11/01/21 1227  Temp    Pulse 58 11/01/21 1229  Resp 17 11/01/21 1229  SpO2 100 % 11/01/21 1229  Vitals shown include unvalidated device data.  Last Pain:  Vitals:   11/01/21 1053  TempSrc: Oral  PainSc: 0-No pain      Patients Stated Pain Goal: 5 (37/16/96 7893)  Complications: No notable events documented.

## 2021-11-01 NOTE — H&P (Signed)
CC: Here today for surgery  HPI: [name] is an 61 y.o. female with history of PUD (now on BID PPI), HLD, asthma, whom is seen in the office today as a referral by Dr. Havery Moros for evaluation of LGSIL found on biopsy of an anal papilla during colonoscopy.   Colonoscopy with Dr. Collene Mares 11/10/17 -7 small sessile polyps removed. 2 other polyps were removed. Diverticulosis. Normal terminal ileum.  Colonoscopy with Dr. Havery Moros 08/11/2021 demonstrated small polyp removed from the cecum. Medium size lipoma in the ascending colon. 2 small polyps in the ascending colon. Small hepatic flexure polyp. Diverticulosis. Hypertrophied anal papilla that was biopsied.  Pathology showed tubular adenomas and a hyperplastic polyp. 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   She was referred to [place] for further evaluation. She denies any anal pain or rectal bleeding. Occasionally notes a small "tonsil" like prolapse from the anal canal that she can pop back in place. No evident history of anal fissures or severe perianal/anal pain with bowel movements.  She does note history of abnormal Pap smear in her 62s but has had normal Pap smears since. She states her last normal one was approximately 1 month ago.  She denies any changes in her health or health history since we met in the office. States she is ready for surgery.   PMH: PUD (now on BID PPI), HLD, asthma  PSH: Denies any prior anorectal procedures or surgeries  FHx: Denies any known family history of colorectal, breast, endometrial or ovarian cancer  Social Hx: Smokes <1PPD; social EtOH use. 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   Past Medical History:  Diagnosis Date   Anxiety    takes Xanax prn    Back pain    DDD   Cancer ([place]) 2023   ankle - basal cell carcinoma   Constipation    COVID-19 04/25/2021   flu-like symptoms   Depression    takes Effexor daily   Genital herpes    GERD  (gastroesophageal reflux disease)    takes Omeprazole daily   Headache    hx of migraines, sinus headaches   Hemorrhoids    History of abnormal cervical Pap smear    age 39--hx conization-paps normal since   History of bronchitis    last time 15months ago   History of colon polyps    History of hiatal hernia 07/2021   5cm per 08/11/21 EGD   History of migraine    last one 36yrs ago--with aura   HSV-1 infection    Hyperlipidemia    not on meds at present and Dr.McKenzie will review after surgery   Insomnia    Peripheral vascular disease (HCC)    PONV (postoperative nausea and vomiting)    Weakness    bil legs, from pvd, resolved after aortobifemoral bypass in 2014   Wears glasses     Past Surgical History:  Procedure Laterality Date   ANKLE SURGERY Left 1986   with 3 screws and 2 pens   AORTA - BILATERAL FEMORAL ARTERY BYPASS GRAFT N/A 01/30/2013   Procedure: AORTA BIFEMORAL BYPASS GRAFT;  Surgeon: Mal Misty, MD;  Location: [facility] OR;  Service: Vascular;  Laterality: N/A;   BREAST SURGERY  02/2020   breast reduction    CARDIOVASCULAR STRESS TEST  01/21/2013   low risk stress test, no ischemia   CERVICAL CONE BIOPSY     age 64   COLONOSCOPY  11/11/2017   polyps, diverticulosis  COLONOSCOPY  08/11/2021   colon polyps, anal papillae (AIN)   ECTOPIC PREGNANCY SURGERY     x 2   ESOPHAGOGASTRODUODENOSCOPY  08/11/2021   5 cm hiatal hernia   left fallopian tube removed     25 years ago as of 10/26/21   wisdom teeth extracted      when patient was 29 years    Family History  Problem Relation Age of Onset   Hyperlipidemia Mother    Heart attack Maternal Grandfather    Hypertension Paternal Grandmother    Breast cancer Other    Colon cancer Neg Hx    Esophageal cancer Neg Hx    Stomach cancer Neg Hx    Rectal cancer Neg Hx     Social:  reports that she has been smoking cigarettes. She has a 8.75 pack-year smoking history. She has never used smokeless tobacco. She  reports current alcohol use of about 2.0 standard drinks of alcohol per week. She reports that she does not use drugs.  Allergies:  Allergies  Allergen Reactions   Chantix [Varenicline]     Had illusions    Lipitor [Atorvastatin]     Muscle aches   Septra [Sulfamethoxazole-Trimethoprim] Other (See Comments)    Unknown    Simvastatin     Muscle cramps     Medications: I have reviewed the patient's current medications.  No results found for this or any previous visit (from the past 48 hour(s)).  No results found.  ROS - all of the below systems have been reviewed with the patient and positives are indicated with bold text General: chills, fever or night sweats Eyes: blurry vision or double vision ENT: epistaxis or sore throat Allergy/Immunology: itchy/watery eyes or nasal congestion Hematologic/Lymphatic: bleeding problems, blood clots or swollen lymph nodes Endocrine: temperature intolerance or unexpected weight changes Breast: new or changing breast lumps or nipple discharge Resp: cough, shortness of breath, or wheezing CV: chest pain or dyspnea on exertion GI: as per HPI GU: dysuria, trouble voiding, or hematuria MSK: joint pain or joint stiffness Neuro: TIA or stroke symptoms Derm: pruritus and skin lesion changes Psych: anxiety and depression  PE Weight 70.3 kg, last menstrual period 10/31/2010. Constitutional: NAD; conversant Eyes: Moist conjunctiva; no lid lag; anicteric Lungs: Normal respiratory effort CV: RRR Psychiatric: Appropriate affect; alert and oriented x3  No results found for this or any previous visit (from the past 48 hour(s)).  No results found.   A/P: [name] is an 61 y.o. female with hx of PUD (now on BID PPI), HLD, asthma, here for evaluation of LGSIL found on bx of anal papilla on colonoscopy  -The anatomy and physiology of the anal canal was discussed with the patient with associated pictures. The pathophysiology of LGSIL/AIN  was discussed at length with associated pictures and illustrations. 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 We discussed that this is a sexually transmitted infection and importance of safe sex practices surrounding this. She is following with gynecology for cervical evaluation. -We have reviewed options going forward including further observation vs surgery -excision of anal polypoid lesion, anorectal exam under anesthesia. -The planned procedure, material risks (including, but not limited to, pain, bleeding, infection, scarring, need for blood transfusion, damage to anal sphincter, rare cases of incontinence of gas and/or stool, need for additional procedures, pelvic sepsis/infection, recurrence, pneumonia, heart attack, stroke, death) benefits and alternatives to  surgery were discussed at length. The patient's questions were answered to her satisfaction, she voiced understanding and elected to proceed with surgery. Additionally, we discussed typical postoperative expectations and the recovery process.  Nadeen Landau, [facility], [place]

## 2021-11-01 NOTE — Anesthesia Procedure Notes (Signed)
Procedure Name: LMA Insertion Date/Time: 11/01/2021 11:45 AM  Performed by: Clearnce Sorrel, CRNAPre-anesthesia Checklist: Patient identified, Emergency Drugs available, Suction available and Patient being monitored Patient Re-evaluated:Patient Re-evaluated prior to induction Oxygen Delivery Method: Circle System Utilized Preoxygenation: Pre-oxygenation with 100% oxygen Induction Type: IV induction Ventilation: Mask ventilation without difficulty LMA: LMA inserted LMA Size: 4.0 Number of attempts: 1 Airway Equipment and Method: Bite block Placement Confirmation: positive ETCO2 Tube secured with: Tape Dental Injury: Teeth and Oropharynx as per pre-operative assessment

## 2021-11-02 LAB — SURGICAL PATHOLOGY

## 2021-11-03 ENCOUNTER — Encounter (HOSPITAL_BASED_OUTPATIENT_CLINIC_OR_DEPARTMENT_OTHER): Payer: Self-pay | Admitting: Surgery

## 2021-11-05 HISTORY — PX: OTHER SURGICAL HISTORY: SHX169

## 2021-11-19 ENCOUNTER — Other Ambulatory Visit: Payer: Self-pay | Admitting: Internal Medicine

## 2021-11-19 ENCOUNTER — Encounter: Payer: Self-pay | Admitting: Internal Medicine

## 2021-11-19 DIAGNOSIS — F411 Generalized anxiety disorder: Secondary | ICD-10-CM

## 2021-11-19 DIAGNOSIS — E785 Hyperlipidemia, unspecified: Secondary | ICD-10-CM

## 2021-11-22 MED ORDER — ROSUVASTATIN CALCIUM 20 MG PO TABS: 20.0000 mg | ORAL_TABLET | Freq: Every day | ORAL | 0 refills | Status: DC

## 2021-11-22 MED ORDER — ALPRAZOLAM 0.5 MG PO TABS: 0.5000 mg | ORAL_TABLET | Freq: Every evening | ORAL | 1 refills | Status: DC | PRN

## 2021-11-22 MED ORDER — VENLAFAXINE HCL ER 75 MG PO CP24: 75.0000 mg | ORAL_CAPSULE | Freq: Every day | ORAL | 0 refills | Status: DC

## 2021-11-23 ENCOUNTER — Encounter: Payer: Self-pay | Admitting: Internal Medicine

## 2021-11-24 ENCOUNTER — Ambulatory Visit (INDEPENDENT_AMBULATORY_CARE_PROVIDER_SITE_OTHER): Payer: BC Managed Care – PPO | Admitting: *Deleted

## 2021-11-24 ENCOUNTER — Telehealth: Payer: Self-pay | Admitting: Internal Medicine

## 2021-11-24 DIAGNOSIS — Z23 Encounter for immunization: Secondary | ICD-10-CM | POA: Diagnosis not present

## 2021-11-24 NOTE — Telephone Encounter (Signed)
Yes - thank you

## 2021-11-24 NOTE — Telephone Encounter (Signed)
Pt is coming in this afternoon at 3 pm for her 2nd Shingles Shot.... Is that alright?  Please advise.

## 2021-11-29 ENCOUNTER — Ambulatory Visit: Payer: BC Managed Care – PPO | Admitting: Gastroenterology

## 2021-12-06 ENCOUNTER — Encounter: Payer: Self-pay | Admitting: Gastroenterology

## 2022-01-13 ENCOUNTER — Ambulatory Visit: Payer: BC Managed Care – PPO | Admitting: Gastroenterology

## 2022-01-13 ENCOUNTER — Encounter: Payer: Self-pay | Admitting: Gastroenterology

## 2022-01-13 VITALS — BP 136/78 | HR 88 | Ht 67.0 in | Wt 162.8 lb

## 2022-01-13 DIAGNOSIS — Z86010 Personal history of colonic polyps: Secondary | ICD-10-CM | POA: Diagnosis not present

## 2022-01-13 DIAGNOSIS — K219 Gastro-esophageal reflux disease without esophagitis: Secondary | ICD-10-CM

## 2022-01-13 DIAGNOSIS — K6282 Dysplasia of anus: Secondary | ICD-10-CM

## 2022-01-13 DIAGNOSIS — Z79899 Other long term (current) drug therapy: Secondary | ICD-10-CM | POA: Diagnosis not present

## 2022-01-13 DIAGNOSIS — K449 Diaphragmatic hernia without obstruction or gangrene: Secondary | ICD-10-CM | POA: Diagnosis not present

## 2022-01-13 MED ORDER — PANTOPRAZOLE SODIUM 40 MG PO TBEC: 40.0000 mg | DELAYED_RELEASE_TABLET | Freq: Every day | ORAL | 3 refills | Status: DC

## 2022-01-13 NOTE — Progress Notes (Signed)
HPI :  61 year old female here for follow-up visit for history of GI bleed, hiatal hernia, GERD, AIN.  Recall she was seen in March of this year for episode of hematemesis in the setting of some ibuprofen use.  She underwent an EGD with me on March 29 showing 5 cm hiatal hernia, with evidence of healed ulceration/esophagitis while on PPI, which was the likely source of her bleeding symptoms.  She was on Protonix 40 mg twice daily and has remained on that.  She states prior to her endoscopy and her bleeding symptoms she was having significant heartburn and reflux that was bothering her routinely.  Does have been going on for a long time.  She was using Tums frequently to control symptoms.  Since being on Protonix she has not had any reflux that bother her and she is feeling so much better in this regard.  We discussed long-term management options.  She inquires if hiatal hernia is the same as a hernia in her abdomen which she has noted in her epigastric area and wanted evaluated.  This does not hurt but when she sits up she feels protrusion from her epigastric area.  Otherwise she had a colonoscopy with me in March for history of colon polyps.  She had a few small precancerous polyps removed, told to repeat her next exam in 5 years.  Otherwise had a hypertrophied anal papilla noted for which biopsies were obtained, results showing AIN.  She was referred to Dr. Dema Severin, she underwent resection of this lesion which was consistent with AIN.  She states it took about 2 weeks to heal and had severe rectal pain until this healed up, currently doing well at this point time.  She is due to follow-up with Dr. Dema Severin every 6 months.  Colonoscopy with Dr. Collene Mares on 11/10/2017. 7 small sessile polyps.  6 in the rectosigmoid colon and 1 in the proximal ascending colon.  2 6-7 mm polyps, 1 in the rectosigmoid colon and 1 in the proximal descending colon, few small and large mouth diverticula in sigmoid colon and terminal  ileum appeared normal.  Small internal hemorrhoids.  At that time repeat recommended in 3 to 5 years for surveillance based on pathology.  Pathology showed hyperplastic polyps in the rectosigmoid.  Tubular adenoma in the descending and ascending colon.  Repeat recommended in 3 years.  This was due 11/10/2020.   EGD 08/11/21: - A 5 cm hiatal hernia was present. - There was evidence of healed esophagitis / ulceration at the GEJ, suspect likely source of prior bleeding that has since healed. The exam of the esophagus was otherwise normal. - The entire examined stomach was normal. - A large diverticulum was found in the second portion of the duodenum. - The exam of the duodenum was otherwise normal.  Colonoscopy 08/11/21: The perianal and digital rectal examinations were normal. - A diminutive polyp was found in the cecum. The polyp was sessile. The polyp was removed with a cold snare. Resection and retrieval were complete. - There was a medium-sized lipoma, in the ascending colon. - Two sessile polyps were found in the ascending colon. The polyps were 3 mm in size. These polyps were removed with a cold snare. Resection and retrieval were complete. - A 4 mm polyp was found in the hepatic flexure. The polyp was sessile. The polyp was removed with a cold snare. Resection and retrieval were complete. - Multiple small-mouthed diverticula were found in the entire colon. - Anal papilla(e) were  hypertrophied. Biopsies were taken with a cold forceps for histology, rule out AIN. - Internal hemorrhoids were found during retroflexion. - The exam was otherwise without abnormality.  1. Surgical [P], colon, cecum, ascending, hepatic flexure, polyp (4) TUBULAR ADENOMAS AND A HYPERPLASTIC POLYP. NEGATIVE FOR HIGH-GRADE DYSPLASIA. 2. Surgical [P], colon, rectum ANAL PAPILLA WITH LOW-GRADE DYSPLASIA (AIN 1). NEGATIVE FOR HIGH-GRADE DYSPLASIA AND MALIGNANCY.   Repeat colonoscopy in 5 years  Past Medical  History:  Diagnosis Date   Anxiety    takes Xanax prn    Asthma    Back pain    DDD   Cancer ([place]) 2023   ankle - basal cell carcinoma   Constipation    COVID-19 04/25/2021   flu-like symptoms   Depression    takes Effexor daily   Genital herpes    GERD (gastroesophageal reflux disease)    takes Omeprazole daily   Headache    hx of migraines, sinus headaches   Hemorrhoids    History of abnormal cervical Pap smear    age 54--hx conization-paps normal since   History of bronchitis    last time 21month ago   History of colon polyps    History of hiatal hernia 07/2021   5cm per 08/11/21 EGD   History of migraine    last one 546yrago--with aura   HSV-1 infection    Hyperlipidemia    not on meds at present and Dr.McKenzie will review after surgery   Insomnia    Peripheral vascular disease (HCC)    PONV (postoperative nausea and vomiting)    PUD (peptic ulcer disease)    Weakness    bil legs, from pvd, resolved after aortobifemoral bypass in 2014   Wears glasses      Past Surgical History:  Procedure Laterality Date   ANKLE SURGERY Left 1986   with 3 screws and 2 pens   AORTA - BILATERAL FEMORAL ARTERY BYPASS GRAFT N/A 01/30/2013   Procedure: AORTA BIFEMORAL BYPASS GRAFT;  Surgeon: JaMal MistyMD;  [facility];  Service: Vascular;  Laterality: N/A;   BREAST SURGERY  02/2020   breast reduction    CARDIOVASCULAR STRESS TEST  01/21/2013   low risk stress test, no ischemia   CERVICAL CONE BIOPSY     age 61 COLONOSCOPY  11/11/2017   polyps, diverticulosis   COLONOSCOPY  08/11/2021   colon polyps, anal papillae (AIN)   ECTOPIC PREGNANCY SURGERY     x 2   ESOPHAGOGASTRODUODENOSCOPY  08/11/2021   5 cm hiatal hernia   left fallopian tube removed     25 years ago as of 10/26/21   MASS EXCISION N/A 11/01/2021   Procedure: EXCISION OF ANAL CANAL LESION UNDER ANOSCOPY;  Surgeon: WhIleana RoupMD;  [facility];   Laterality: N/A;   RECTAL EXAM UNDER ANESTHESIA N/A 11/01/2021   Procedure: ANORECTAL EXAM UNDER ANESTHESIA;  Surgeon: WhIleana RoupMD;  [facility];  Laterality: N/A;   wisdom teeth extracted      when patient was [age]ears   Family History  Problem Relation Age of Onset   Hyperlipidemia Mother    Heart attack Maternal Grandfather    Hypertension Paternal Grandmother    Breast cancer Other    Colon cancer Neg Hx    Esophageal cancer Neg Hx    Stomach cancer Neg Hx    Rectal cancer Neg Hx  Social History   Tobacco Use   Smoking status: Every Day    Packs/day: 0.25    Years: 35.00    Total pack years: 8.75    Types: Cigarettes   Smokeless tobacco: Never  Vaping Use   Vaping Use: Never used  Substance Use Topics   Alcohol use: Yes    Alcohol/week: 2.0 standard drinks of alcohol    Types: 2 Glasses of wine per week    Comment: weekends only   Drug use: No   Current Outpatient Medications  Medication Sig Dispense Refill   albuterol (VENTOLIN HFA) 108 (90 Base) MCG/ACT inhaler every 6 (six) hours as needed. Hasn't had to use in months per pt on 10/26/21.     ALPRAZolam (XANAX) 0.5 MG tablet Take 1 tablet (0.5 mg total) by mouth at bedtime as needed for sleep. 30 tablet 1   aspirin 81 MG chewable tablet Chew 1 tablet (81 mg total) by mouth daily.     Aspirin-Caffeine (BAYER BACK & BODY PO) Take by mouth as needed.     Nutritional Supplements (BIFIDO-GENIC GROWTH FACTORS PO) Take 4 tablets by mouth daily.     rosuvastatin (CRESTOR) 20 MG tablet Take 1 tablet (20 mg total) by mouth daily. 90 tablet 0   valACYclovir (VALTREX) 500 MG tablet TAKE 1 TABLET (500 MG TOTAL) BY MOUTH AS NEEDED. 30 tablet 5   venlafaxine XR (EFFEXOR-XR) 75 MG 24 hr capsule Take 1 capsule (75 mg total) by mouth daily with breakfast. 90 capsule 0   cetirizine (ZYRTEC) 10 MG tablet TAKE 1 TABLET BY MOUTH EVERY DAY (Patient not taking: Reported on 01/13/2022)  90 tablet 1   fluticasone (FLONASE) 50 MCG/ACT nasal spray Place 1 spray into both nostrils daily. (Patient not taking: Reported on 10/26/2021) 16 g 0   pantoprazole (PROTONIX) 40 MG tablet Take 1 tablet (40 mg total) by mouth daily. 90 tablet 3   No current facility-administered medications for this visit.   Allergies  Allergen Reactions   Chantix [Varenicline]     Had illusions    Lipitor [Atorvastatin]     Muscle aches   Septra [Sulfamethoxazole-Trimethoprim] Other (See Comments)    Unknown    Simvastatin     Muscle cramps      Review of Systems: All systems reviewed and negative except where noted in HPI.   Lab Results  Component Value Date   WBC 5.5 08/06/2021   HGB 14.3 08/06/2021   HCT 42.1 08/06/2021   MCV 93.4 08/06/2021   PLT 198.0 08/06/2021   Lab Results  Component Value Date   CREATININE 0.81 08/06/2021   BUN 17 08/06/2021   NA 140 08/06/2021   K 4.4 08/06/2021   CL 104 08/06/2021   CO2 30 08/06/2021     Lab Results  Component Value Date   ALT 26 08/06/2021   AST 26 08/06/2021   ALKPHOS 83 08/06/2021   BILITOT 0.3 08/06/2021     Physical Exam: BP 136/78   Pulse 88   Ht '5\' 7"'$  (1.702 m)   Wt 162 lb 12.8 oz (73.8 kg)   LMP 10/31/2010   BMI 25.50 kg/m  Constitutional: Pleasant,well-developed, female in no acute distress. Abdominal: Soft, nondistended, nontender. Small midline epigastric ventral hernia. Bowel sounds active throughout. Neurological: Alert and oriented to person place and time. Psychiatric: Normal mood and affect. Behavior is normal.   ASSESSMENT: 61 y.o. female here for assessment of the following  1. Gastroesophageal reflux disease, unspecified whether esophagitis present  2. Hiatal hernia   3. Long-term current use of proton pump inhibitor therapy   4. History of colon polyps   5. AIN grade I   6. Gastroesophageal reflux disease without esophagitis    Longstanding reflux with 5 cm hiatal hernia noted on EGD.  She  likely had ulceration at the GEJ versus esophagitis causing her prior bleeding which resolved with PPI.  She is now on PPI and working really well to control her reflux symptoms.  We discussed long-term risks of chronic PPI use for a bit.  Long-term want to use the lowest dose needed to control her symptoms.  She will reduce her Protonix to 40 mg once daily for the next month or so and see how she does.  If she does well with this can try to decrease further to 20 mg daily.  Alternatively if she has poor control of her symptoms on this dose she can increase back to twice daily as needed.  She can use Tums or Gaviscon as needed for breakthrough.  Otherwise due for surveillance colonoscopy in 5 years from her last exam, will follow-up with Dr. Dema Severin for history of AIN.  PLAN: - discussed long term PPI use and risks - reduce protonix to '40mg'$  / day - change prescription - can increase to BID if not doing well - can further reduce to '20mg'$  as tolerated in a few months - Gaviscon or TUMS PRN for breakthrough - avoid routine NSAID use - colonoscopy in 5 years - follow up office 1 year - follow up with [facility] in 6 months for AIN  Jolly Mango, MD [facility]

## 2022-01-13 NOTE — Patient Instructions (Signed)
We have sent the following medications to your pharmacy for you to pick up at your convenience: pantoprazole 40 mg daily.   You can take over the counter Gaviscon or Tums as needed.   The [facility] providers would like to encourage you to use [facility] to communicate with providers for non-urgent requests or questions.  Due to long hold times on the telephone, sending your provider a message by [facility] may be a faster and more efficient way to get a response.  Please allow 48 business hours for a response.  Please remember that this is for non-urgent requests.

## 2022-02-03 ENCOUNTER — Encounter: Payer: Self-pay | Admitting: Internal Medicine

## 2022-02-03 ENCOUNTER — Ambulatory Visit: Payer: BC Managed Care – PPO | Admitting: Internal Medicine

## 2022-02-03 VITALS — BP 120/87 | HR 85 | Temp 97.8°F | Resp 18 | Ht 67.0 in | Wt 162.4 lb

## 2022-02-03 DIAGNOSIS — R5383 Other fatigue: Secondary | ICD-10-CM | POA: Diagnosis not present

## 2022-02-03 DIAGNOSIS — R0981 Nasal congestion: Secondary | ICD-10-CM | POA: Diagnosis not present

## 2022-02-03 DIAGNOSIS — J069 Acute upper respiratory infection, unspecified: Secondary | ICD-10-CM

## 2022-02-03 DIAGNOSIS — F411 Generalized anxiety disorder: Secondary | ICD-10-CM

## 2022-02-03 LAB — POCT INFLUENZA A/B
Influenza A, POC: NEGATIVE
Influenza B, POC: NEGATIVE

## 2022-02-03 LAB — POC COVID19 BINAXNOW: SARS Coronavirus 2 Ag: NEGATIVE

## 2022-02-03 MED ORDER — BENZONATATE 100 MG PO CAPS: 100.0000 mg | ORAL_CAPSULE | Freq: Two times a day (BID) | ORAL | 0 refills | Status: DC | PRN

## 2022-02-03 MED ORDER — ALPRAZOLAM 0.5 MG PO TABS: 0.5000 mg | ORAL_TABLET | Freq: Every evening | ORAL | 1 refills | Status: DC | PRN

## 2022-02-03 NOTE — Progress Notes (Signed)
Established Patient Office Visit     CC/Reason for Visit: URI symptoms  HPI: [name] is a 61 y.o. female who is coming in today for the above mentioned reasons.  For the past 4 days has been experiencing postnasal drip, cough with yellow mucus, sinus pressure, sore throat and headache.  She has been taking Zyrtec without much relief.   Past Medical/Surgical History: Past Medical History:  Diagnosis Date   Anxiety    takes Xanax prn    Asthma    Back pain    DDD   Cancer ([place]) 2023   ankle - basal cell carcinoma   Constipation    COVID-19 04/25/2021   flu-like symptoms   Depression    takes Effexor daily   Genital herpes    GERD (gastroesophageal reflux disease)    takes Omeprazole daily   Headache    hx of migraines, sinus headaches   Hemorrhoids    History of abnormal cervical Pap smear    age 89--hx conization-paps normal since   History of bronchitis    last time 98month ago   History of colon polyps    History of hiatal hernia 07/2021   5cm per 08/11/21 EGD   History of migraine    last one 561yrago--with aura   HSV-1 infection    Hyperlipidemia    not on meds at present and Dr.McKenzie will review after surgery   Insomnia    Peripheral vascular disease (HCC)    PONV (postoperative nausea and vomiting)    PUD (peptic ulcer disease)    Weakness    bil legs, from pvd, resolved after aortobifemoral bypass in 2014   Wears glasses     Past Surgical History:  Procedure Laterality Date   ANKLE SURGERY Left 1986   with 3 screws and 2 pens   AORTA - BILATERAL FEMORAL ARTERY BYPASS GRAFT N/A 01/30/2013   Procedure: AORTA BIFEMORAL BYPASS GRAFT;  Surgeon: JaMal MistyMD;  [facility];  Service: Vascular;  Laterality: N/A;   BREAST SURGERY  02/2020   breast reduction    CARDIOVASCULAR STRESS TEST  01/21/2013   low risk stress test, no ischemia   CERVICAL CONE BIOPSY     age [age] COLONOSCOPY  11/11/2017   polyps, diverticulosis    COLONOSCOPY  08/11/2021   colon polyps, anal papillae (AIN)   ECTOPIC PREGNANCY SURGERY     x 2   ESOPHAGOGASTRODUODENOSCOPY  08/11/2021   5 cm hiatal hernia   left fallopian tube removed     25 years ago as of 10/26/21   MASS EXCISION N/A 11/01/2021   Procedure: EXCISION OF ANAL CANAL LESION UNDER ANOSCOPY;  Surgeon: WhIleana RoupMD;  [facility];  Laterality: N/A;   RECTAL EXAM UNDER ANESTHESIA N/A 11/01/2021   Procedure: ANORECTAL EXAM UNDER ANESTHESIA;  Surgeon: WhIleana RoupMD;  [facility];  Laterality: N/A;   wisdom teeth extracted      when patient was 14 years    Social History:  reports that she has been smoking cigarettes. She has a 8.75 pack-year smoking history. She has never used smokeless tobacco. She reports current alcohol use of about 2.0 standard drinks of alcohol per week. She reports that she does not use drugs.  Allergies: Allergies  Allergen Reactions   Chantix [Varenicline]     Had illusions    Lipitor [Atorvastatin]  Muscle aches   Septra [Sulfamethoxazole-Trimethoprim] Other (See Comments)    Unknown    Simvastatin     Muscle cramps     Family History:  Family History  Problem Relation Age of Onset   Hyperlipidemia Mother    Heart attack Maternal Grandfather    Hypertension Paternal Grandmother    Breast cancer Other    Colon cancer Neg Hx    Esophageal cancer Neg Hx    Stomach cancer Neg Hx    Rectal cancer Neg Hx      Current Outpatient Medications:    albuterol (VENTOLIN HFA) 108 (90 Base) MCG/ACT inhaler, every 6 (six) hours as needed. Hasn't had to use in months per pt on 10/26/21., Disp: , Rfl:    aspirin 81 MG chewable tablet, Chew 1 tablet (81 mg total) by mouth daily., Disp: , Rfl:    Aspirin-Caffeine (BAYER BACK & BODY PO), Take by mouth as needed., Disp: , Rfl:    benzonatate (TESSALON) 100 MG capsule, Take 1 capsule (100 mg  total) by mouth 2 (two) times daily as needed for cough., Disp: 20 capsule, Rfl: 0   Nutritional Supplements (BIFIDO-GENIC GROWTH FACTORS PO), Take 4 tablets by mouth daily., Disp: , Rfl:    pantoprazole (PROTONIX) 40 MG tablet, Take 1 tablet (40 mg total) by mouth daily., Disp: 90 tablet, Rfl: 3   rosuvastatin (CRESTOR) 20 MG tablet, Take 1 tablet (20 mg total) by mouth daily., Disp: 90 tablet, Rfl: 0   valACYclovir (VALTREX) 500 MG tablet, TAKE 1 TABLET (500 MG TOTAL) BY MOUTH AS NEEDED., Disp: 30 tablet, Rfl: 5   venlafaxine XR (EFFEXOR-XR) 75 MG 24 hr capsule, Take 1 capsule (75 mg total) by mouth daily with breakfast., Disp: 90 capsule, Rfl: 0   ALPRAZolam (XANAX) 0.5 MG tablet, Take 1 tablet (0.5 mg total) by mouth at bedtime as needed for sleep., Disp: 30 tablet, Rfl: 1   cetirizine (ZYRTEC) 10 MG tablet, TAKE 1 TABLET BY MOUTH EVERY DAY, Disp: 90 tablet, Rfl: 1   fluticasone (FLONASE) 50 MCG/ACT nasal spray, Place 1 spray into both nostrils daily., Disp: 16 g, Rfl: 0  Review of Systems:  Constitutional: Denies fever, chills, diaphoresis, appetite change.  HEENT: Denies photophobia, eye pain, redness, mouth sores, trouble swallowing, neck pain, neck stiffness and tinnitus.   Respiratory: Denies SOB, DOE,chest tightness,  and wheezing.   Cardiovascular: Denies chest pain, palpitations and leg swelling.  Gastrointestinal: Denies nausea, vomiting, abdominal pain, diarrhea, constipation, blood in stool and abdominal distention.  Genitourinary: Denies dysuria, urgency, frequency, hematuria, flank pain and difficulty urinating.  Endocrine: Denies: hot or cold intolerance, sweats, changes in hair or nails, polyuria, polydipsia. Musculoskeletal: Denies myalgias, back pain, joint swelling, arthralgias and gait problem.  Skin: Denies pallor, rash and wound.  Neurological: Denies dizziness, seizures, syncope, weakness, light-headedness, numbness and headaches.  Hematological: Denies adenopathy. Easy  bruising, personal or family bleeding history  Psychiatric/Behavioral: Denies suicidal ideation, mood changes, confusion, nervousness, sleep disturbance and agitation    Physical Exam: Vitals:   02/03/22 1502  BP: 120/87  Pulse: 85  Resp: 18  Temp: 97.8 F (36.6 C)  TempSrc: Oral  SpO2: 98%  Weight: 162 lb 7 oz (73.7 kg)  Height: '5\' 7"'$  (1.702 m)    Body mass index is 25.44 kg/m.   Constitutional: NAD, calm, comfortable Eyes: PERRL, lids and conjunctivae normal ENMT: Mucous membranes are moist. Posterior pharynx is erythematous but clear of any exudate or lesions. Normal dentition. Tympanic membrane is pearly  white, no erythema or bulging. Respiratory: clear to auscultation bilaterally, no wheezing, no crackles. Normal respiratory effort. No accessory muscle use.  Cardiovascular: Regular rate and rhythm, no murmurs / rubs / gallops. No extremity edema. Psychiatric: Normal judgment and insight. Alert and oriented x 3. Normal mood.    Impression and Plan:  Other fatigue - Plan: POC COVID-19, POC Influenza A/B  Nasal congestion - Plan: POC COVID-19, POC Influenza A/B  Viral URI with cough - Plan: benzonatate (TESSALON) 100 MG capsule  GAD (generalized anxiety disorder) - Plan: ALPRAZolam (XANAX) 0.5 MG tablet  -In office flu and COVID tests have resulted negative. -Given exam findings, PNA, pharyngitis, ear infection are not likely, hence abx have not been prescribed. -Have advised rest, fluids, OTC antihistamines, cough suppressants and mucinex. -RTC if no improvement in 10-14 days. -Tessalon perles for cough. -Requesting xanax refills for her anxiety.   Time spent:30 minutes reviewing chart, interviewing and examining patient and formulating plan of care.     Lelon Frohlich, MD [facility]

## 2022-02-08 ENCOUNTER — Encounter: Payer: Self-pay | Admitting: Internal Medicine

## 2022-03-01 ENCOUNTER — Other Ambulatory Visit: Payer: Self-pay | Admitting: Internal Medicine

## 2022-03-01 DIAGNOSIS — J069 Acute upper respiratory infection, unspecified: Secondary | ICD-10-CM

## 2022-03-07 ENCOUNTER — Encounter: Payer: Self-pay | Admitting: Internal Medicine

## 2022-03-15 ENCOUNTER — Encounter: Payer: Self-pay | Admitting: Internal Medicine

## 2022-03-15 ENCOUNTER — Ambulatory Visit: Payer: BC Managed Care – PPO | Admitting: Internal Medicine

## 2022-03-15 VITALS — BP 156/100 | HR 77 | Temp 97.8°F | Wt 163.4 lb

## 2022-03-15 DIAGNOSIS — G9331 Postviral fatigue syndrome: Secondary | ICD-10-CM

## 2022-03-15 NOTE — Progress Notes (Signed)
Established Patient Office Visit     CC/Reason for Visit: Exhaustion, fatigue  HPI: [name] is a 61 y.o. female who is coming in today for the above mentioned reasons.  10 days ago she went to urgent care due to a high fever of 102 and was diagnosed with both influenza A and severe maxillary sinusitis.  She was prescribed Augmentin and Tamiflu.  She still has a few more days of Augmentin to complete.  The day prior to this she states that she "blacked out".  She never sought emergent care.  She states she was at [employer] and does not remember any events until the next morning.  She only had about 3 glasses of wine.  She states that her husband mentioned that she had been talking throughout all of this.  She was able to walk.  I think more than a syncopal episode this was probably fever induced delirium.  She is complaining of severe exhaustion and fatigue.  She thinks something is wrong with her.  Is requesting labs.   Past Medical/Surgical History: Past Medical History:  Diagnosis Date   Anxiety    takes Xanax prn    Asthma    Back pain    DDD   Cancer ([place]) 2023   ankle - basal cell carcinoma   Constipation    COVID-19 04/25/2021   flu-like symptoms   Depression    takes Effexor daily   Genital herpes    GERD (gastroesophageal reflux disease)    takes Omeprazole daily   Headache    hx of migraines, sinus headaches   Hemorrhoids    History of abnormal cervical Pap smear    age 21--hx conization-paps normal since   History of bronchitis    last time 56month ago   History of colon polyps    History of hiatal hernia 07/2021   5cm per 08/11/21 EGD   History of migraine    last one 582yrago--with aura   HSV-1 infection    Hyperlipidemia    not on meds at present and Dr.McKenzie will review after surgery   Insomnia    Peripheral vascular disease (HCC)    PONV (postoperative nausea and vomiting)    PUD (peptic ulcer disease)    Weakness    bil legs,  from pvd, resolved after aortobifemoral bypass in 2014   Wears glasses     Past Surgical History:  Procedure Laterality Date   ANKLE SURGERY Left 1986   with 3 screws and 2 pens   AORTA - BILATERAL FEMORAL ARTERY BYPASS GRAFT N/A 01/30/2013   Procedure: AORTA BIFEMORAL BYPASS GRAFT;  Surgeon: JaMal MistyMD;  [facility];  Service: Vascular;  Laterality: N/A;   BREAST SURGERY  02/2020   breast reduction    CARDIOVASCULAR STRESS TEST  01/21/2013   low risk stress test, no ischemia   CERVICAL CONE BIOPSY     age 61 COLONOSCOPY  11/11/2017   polyps, diverticulosis   COLONOSCOPY  08/11/2021   colon polyps, anal papillae (AIN)   ECTOPIC PREGNANCY SURGERY     x 2   ESOPHAGOGASTRODUODENOSCOPY  08/11/2021   5 cm hiatal hernia   left fallopian tube removed     25 years ago as of 10/26/21   MASS EXCISION N/A 11/01/2021   Procedure: EXCISION OF ANAL CANAL LESION UNDER ANOSCOPY;  Surgeon: WhIleana RoupMD;  [facility];  Laterality:  N/A;   RECTAL EXAM UNDER ANESTHESIA N/A 11/01/2021   Procedure: ANORECTAL EXAM UNDER ANESTHESIA;  Surgeon: Ileana Roup, MD;  Location: [place];  Service: General;  Laterality: N/A;   wisdom teeth extracted      when patient was 14 years    Social History:  reports that she has been smoking cigarettes. She has a 8.75 pack-year smoking history. She has never used smokeless tobacco. She reports current alcohol use of about 2.0 standard drinks of alcohol per week. She reports that she does not use drugs.  Allergies: Allergies  Allergen Reactions   Chantix [Varenicline]     Had illusions    Lipitor [Atorvastatin]     Muscle aches   Septra [Sulfamethoxazole-Trimethoprim] Other (See Comments)    Unknown    Simvastatin     Muscle cramps     Family History:  Family History  Problem Relation Age of Onset   Hyperlipidemia Mother    Heart attack Maternal Grandfather     Hypertension Paternal Grandmother    Breast cancer Other    Colon cancer Neg Hx    Esophageal cancer Neg Hx    Stomach cancer Neg Hx    Rectal cancer Neg Hx      Current Outpatient Medications:    albuterol (VENTOLIN HFA) 108 (90 Base) MCG/ACT inhaler, every 6 (six) hours as needed. Hasn't had to use in months per pt on 10/26/21., Disp: , Rfl:    ALPRAZolam (XANAX) 0.5 MG tablet, Take 1 tablet (0.5 mg total) by mouth at bedtime as needed for sleep., Disp: 30 tablet, Rfl: 1   amoxicillin-clavulanate (AUGMENTIN) 875-125 MG tablet, Take 1 tablet by mouth every 12 (twelve) hours., Disp: , Rfl:    aspirin 81 MG chewable tablet, Chew 1 tablet (81 mg total) by mouth daily., Disp: , Rfl:    Aspirin-Caffeine (BAYER BACK & BODY PO), Take by mouth as needed., Disp: , Rfl:    benzonatate (TESSALON) 100 MG capsule, TAKE 1 CAPSULE BY MOUTH 2 TIMES DAILY AS NEEDED FOR COUGH., Disp: 20 capsule, Rfl: 0   Nutritional Supplements (BIFIDO-GENIC GROWTH FACTORS PO), Take 4 tablets by mouth daily., Disp: , Rfl:    oseltamivir (TAMIFLU) 75 MG capsule, Take 75 mg by mouth 2 (two) times daily., Disp: , Rfl:    pantoprazole (PROTONIX) 40 MG tablet, Take 1 tablet (40 mg total) by mouth daily., Disp: 90 tablet, Rfl: 3   rosuvastatin (CRESTOR) 20 MG tablet, Take 1 tablet (20 mg total) by mouth daily., Disp: 90 tablet, Rfl: 0   valACYclovir (VALTREX) 500 MG tablet, TAKE 1 TABLET (500 MG TOTAL) BY MOUTH AS NEEDED., Disp: 30 tablet, Rfl: 5   venlafaxine XR (EFFEXOR-XR) 75 MG 24 hr capsule, Take 1 capsule (75 mg total) by mouth daily with breakfast., Disp: 90 capsule, Rfl: 0  Review of Systems:  Constitutional: Positive for fever, chills, diaphoresis, appetite change and fatigue.  HEENT: Denies photophobia, mouth sores, trouble swallowing, neck pain, neck stiffness and tinnitus.   Respiratory: Denies chest tightness,  and wheezing.   Cardiovascular: Denies chest pain, palpitations and leg swelling.  Gastrointestinal:  Denies nausea, vomiting, abdominal pain, diarrhea, constipation, blood in stool and abdominal distention.  Genitourinary: Denies dysuria, urgency, frequency, hematuria, flank pain and difficulty urinating.  Endocrine: Denies: hot or cold intolerance, sweats, changes in hair or nails, polyuria, polydipsia. Musculoskeletal: Denies myalgias, back pain, joint swelling, arthralgias and gait problem.  Skin: Denies pallor, rash and wound.  Neurological: Denies dizziness, seizures,  syncope, weakness, light-headedness, numbness and headaches.  Hematological: Denies adenopathy. Easy bruising, personal or family bleeding history  Psychiatric/Behavioral: Denies suicidal ideation, confusion.Marland Kitchen    Physical Exam: Vitals:   03/15/22 1334 03/15/22 1337  BP: (!) 140/100 (!) 156/100  Pulse: 77   Temp: 97.8 F (36.6 C)   TempSrc: Oral   SpO2: 99%   Weight: 163 lb 6.4 oz (74.1 kg)     Body mass index is 25.59 kg/m.   Constitutional: NAD, calm, comfortable Eyes: PERRL, lids and conjunctival injection bilateral left greater than right. ENMT: Mucous membranes are moist. Posterior pharynx is erythematous clear of any exudate or lesions. Normal dentition. Tympanic membrane is pearly white, no erythema or bulging. Neck: normal, supple, no masses, no thyromegaly Respiratory: clear to auscultation bilaterally, no wheezing, no crackles. Normal respiratory effort. No accessory muscle use.  Cardiovascular: Regular rate and rhythm, no murmurs / rubs / gallops. No extremity edema.  Impression and Plan:  Postviral fatigue syndrome  -Reassured her that this is normal after the infectious process that she has just been through.  It can take 6 weeks or longer to fully resolve.  Time spent:23 minutes reviewing chart, interviewing and examining patient and formulating plan of care.      Lelon Frohlich, MD [facility]

## 2022-04-26 ENCOUNTER — Encounter: Payer: Self-pay | Admitting: Internal Medicine

## 2022-04-26 ENCOUNTER — Ambulatory Visit (INDEPENDENT_AMBULATORY_CARE_PROVIDER_SITE_OTHER): Payer: BC Managed Care – PPO | Admitting: Internal Medicine

## 2022-04-26 VITALS — BP 110/80 | HR 82 | Temp 97.4°F | Ht 65.5 in | Wt 162.4 lb

## 2022-04-26 DIAGNOSIS — G4700 Insomnia, unspecified: Secondary | ICD-10-CM

## 2022-04-26 DIAGNOSIS — F17210 Nicotine dependence, cigarettes, uncomplicated: Secondary | ICD-10-CM

## 2022-04-26 DIAGNOSIS — Z23 Encounter for immunization: Secondary | ICD-10-CM | POA: Diagnosis not present

## 2022-04-26 DIAGNOSIS — Z0000 Encounter for general adult medical examination without abnormal findings: Secondary | ICD-10-CM

## 2022-04-26 LAB — VITAMIN B12: Vitamin B-12: 674 pg/mL (ref 211–911)

## 2022-04-26 LAB — LIPID PANEL
Cholesterol: 231 mg/dL — ABNORMAL HIGH (ref 0–200)
HDL: 56.7 mg/dL (ref >39.00)
NonHDL: 174.31
Total CHOL/HDL Ratio: 4
Triglycerides: 336 mg/dL — ABNORMAL HIGH (ref 0.0–149.0)
VLDL: 67.2 mg/dL — ABNORMAL HIGH (ref 0.0–40.0)

## 2022-04-26 LAB — COMPREHENSIVE METABOLIC PANEL
ALT: 22 U/L (ref 0–35)
AST: 22 U/L (ref 0–37)
Albumin: 4.7 g/dL (ref 3.5–5.2)
Alkaline Phosphatase: 84 U/L (ref 39–117)
BUN: 17 mg/dL (ref 6–23)
CO2: 27 mEq/L (ref 19–32)
Calcium: 10.1 mg/dL (ref 8.4–10.5)
Chloride: 103 mEq/L (ref 96–112)
Creatinine, Ser: 0.88 mg/dL (ref 0.40–1.20)
GFR: 71.09 mL/min (ref >60.00)
Glucose, Bld: 90 mg/dL (ref 70–99)
Potassium: 4.7 mEq/L (ref 3.5–5.1)
Sodium: 139 mEq/L (ref 135–145)
Total Bilirubin: 0.4 mg/dL (ref 0.2–1.2)
Total Protein: 7.6 g/dL (ref 6.0–8.3)

## 2022-04-26 LAB — VITAMIN D 25 HYDROXY (VIT D DEFICIENCY, FRACTURES): VITD: 38.05 ng/mL (ref 30.00–100.00)

## 2022-04-26 LAB — CBC WITH DIFFERENTIAL/PLATELET
Basophils Absolute: 0 10*3/uL (ref 0.0–0.1)
Basophils Relative: 0.6 % (ref 0.0–3.0)
Eosinophils Absolute: 0.1 10*3/uL (ref 0.0–0.7)
Eosinophils Relative: 1.4 % (ref 0.0–5.0)
HCT: 46.1 % — ABNORMAL HIGH (ref 36.0–46.0)
Hemoglobin: 15.9 g/dL — ABNORMAL HIGH (ref 12.0–15.0)
Lymphocytes Relative: 27.8 % (ref 12.0–46.0)
Lymphs Abs: 2.1 10*3/uL (ref 0.7–4.0)
MCHC: 34.4 g/dL (ref 30.0–36.0)
MCV: 92.3 fl (ref 78.0–100.0)
Monocytes Absolute: 0.7 10*3/uL (ref 0.1–1.0)
Monocytes Relative: 9.3 % (ref 3.0–12.0)
Neutro Abs: 4.7 10*3/uL (ref 1.4–7.7)
Neutrophils Relative %: 60.9 % (ref 43.0–77.0)
Platelets: 235 10*3/uL (ref 150.0–400.0)
RBC: 5 Mil/uL (ref 3.87–5.11)
RDW: 13.1 % (ref 11.5–15.5)
WBC: 7.7 10*3/uL (ref 4.0–10.5)

## 2022-04-26 LAB — LDL CHOLESTEROL, DIRECT: Direct LDL: 128 mg/dL

## 2022-04-26 LAB — TSH: TSH: 0.69 u[IU]/mL (ref 0.35–5.50)

## 2022-04-26 LAB — HEMOGLOBIN A1C: Hgb A1c MFr Bld: 6.3 % (ref 4.6–6.5)

## 2022-04-26 NOTE — Patient Instructions (Signed)
-  Nice seeing you today!!  -Lab work today; will notify you once results are available.  -Remember COVID and RSV vaccines.  -Schedule follow up in 1 year or sooner as needed.

## 2022-04-26 NOTE — Progress Notes (Signed)
Established Patient Office Visit     CC/Reason for Visit: Annual preventive exam  HPI: [name] is a 61 y.o. female who is coming in today for the above mentioned reasons. Past Medical History is significant for: Generalized anxiety disorder, hyperlipidemia, peripheral vascular disease with aortobifemoral bypass in 2016.  She is complaining of issues sleeping.  She has routine eye and dental care.  She is due for COVID, flu, RSV vaccines.  All cancer screening is up-to-date.   Past Medical/Surgical History: Past Medical History:  Diagnosis Date   Anxiety    takes Xanax prn    Asthma    Back pain    DDD   Cancer ([place]) 2023   ankle - basal cell carcinoma   Constipation    COVID-19 04/25/2021   flu-like symptoms   Depression    takes Effexor daily   Genital herpes    GERD (gastroesophageal reflux disease)    takes Omeprazole daily   Headache    hx of migraines, sinus headaches   Hemorrhoids    History of abnormal cervical Pap smear    age 39--hx conization-paps normal since   History of bronchitis    last time 45month ago   History of colon polyps    History of hiatal hernia 07/2021   5cm per 08/11/21 EGD   History of migraine    last one 569yrago--with aura   HSV-1 infection    Hyperlipidemia    not on meds at present and Dr.McKenzie will review after surgery   Insomnia    Peripheral vascular disease (HCC)    PONV (postoperative nausea and vomiting)    PUD (peptic ulcer disease)    Weakness    bil legs, from pvd, resolved after aortobifemoral bypass in 2014   Wears glasses     Past Surgical History:  Procedure Laterality Date   ANKLE SURGERY Left 1986   with 3 screws and 2 pens   AORTA - BILATERAL FEMORAL ARTERY BYPASS GRAFT N/A 01/30/2013   Procedure: AORTA BIFEMORAL BYPASS GRAFT;  Surgeon: JaMal MistyMD;  [facility];  Service: Vascular;  Laterality: N/A;   BREAST SURGERY  02/2020   breast reduction    CARDIOVASCULAR STRESS TEST   01/21/2013   low risk stress test, no ischemia   CERVICAL CONE BIOPSY     age 61 COLONOSCOPY  11/11/2017   polyps, diverticulosis   COLONOSCOPY  08/11/2021   colon polyps, anal papillae (AIN)   ECTOPIC PREGNANCY SURGERY     x 2   ESOPHAGOGASTRODUODENOSCOPY  08/11/2021   5 cm hiatal hernia   left fallopian tube removed     25 years ago as of 10/26/21   MASS EXCISION N/A 11/01/2021   Procedure: EXCISION OF ANAL CANAL LESION UNDER ANOSCOPY;  Surgeon: WhIleana RoupMD;  [facility];  Laterality: N/A;   RECTAL EXAM UNDER ANESTHESIA N/A 11/01/2021   Procedure: ANORECTAL EXAM UNDER ANESTHESIA;  Surgeon: WhIleana RoupMD;  [place] [facility];  Laterality: N/A;   wisdom teeth extracted      when patient was 14 years    Social History:  reports that she has been smoking cigarettes. She has a 8.75 pack-year smoking history. She has never used smokeless tobacco. She reports current alcohol use of about 2.0 standard drinks of alcohol per week. She reports that she does not use drugs.  Allergies:  Allergies  Allergen Reactions   Chantix [Varenicline]     Had illusions    Lipitor [Atorvastatin]     Muscle aches   Septra [Sulfamethoxazole-Trimethoprim] Other (See Comments)    Unknown    Simvastatin     Muscle cramps     Family History:  Family History  Problem Relation Age of Onset   Hyperlipidemia Mother    Heart attack Maternal Grandfather    Hypertension Paternal Grandmother    Breast cancer Other    Colon cancer Neg Hx    Esophageal cancer Neg Hx    Stomach cancer Neg Hx    Rectal cancer Neg Hx      Current Outpatient Medications:    ALPRAZolam (XANAX) 0.5 MG tablet, Take 1 tablet (0.5 mg total) by mouth at bedtime as needed for sleep., Disp: 30 tablet, Rfl: 1   aspirin 81 MG chewable tablet, Chew 1 tablet (81 mg total) by mouth daily., Disp: , Rfl:    Aspirin-Caffeine (BAYER  BACK & BODY PO), Take by mouth as needed., Disp: , Rfl:    benzonatate (TESSALON) 100 MG capsule, TAKE 1 CAPSULE BY MOUTH 2 TIMES DAILY AS NEEDED FOR COUGH., Disp: 20 capsule, Rfl: 0   Nutritional Supplements (BIFIDO-GENIC GROWTH FACTORS PO), Take 4 tablets by mouth daily., Disp: , Rfl:    pantoprazole (PROTONIX) 40 MG tablet, Take 1 tablet (40 mg total) by mouth daily., Disp: 90 tablet, Rfl: 3   rosuvastatin (CRESTOR) 20 MG tablet, Take 1 tablet (20 mg total) by mouth daily., Disp: 90 tablet, Rfl: 0   valACYclovir (VALTREX) 500 MG tablet, TAKE 1 TABLET (500 MG TOTAL) BY MOUTH AS NEEDED., Disp: 30 tablet, Rfl: 5   venlafaxine XR (EFFEXOR-XR) 75 MG 24 hr capsule, Take 1 capsule (75 mg total) by mouth daily with breakfast., Disp: 90 capsule, Rfl: 0  Review of Systems:  Constitutional: Denies fever, chills, diaphoresis, appetite change and fatigue.  HEENT: Denies photophobia, eye pain, redness, hearing loss, ear pain, congestion, sore throat, rhinorrhea, sneezing, mouth sores, trouble swallowing, neck pain, neck stiffness and tinnitus.   Respiratory: Denies SOB, DOE, cough, chest tightness,  and wheezing.   Cardiovascular: Denies chest pain, palpitations and leg swelling.  Gastrointestinal: Denies nausea, vomiting, abdominal pain, diarrhea, constipation, blood in stool and abdominal distention.  Genitourinary: Denies dysuria, urgency, frequency, hematuria, flank pain and difficulty urinating.  Endocrine: Denies: hot or cold intolerance, sweats, changes in hair or nails, polyuria, polydipsia. Musculoskeletal: Denies myalgias, back pain, joint swelling, arthralgias and gait problem.  Skin: Denies pallor, rash and wound.  Neurological: Denies dizziness, seizures, syncope, weakness, light-headedness, numbness and headaches.  Hematological: Denies adenopathy. Easy bruising, personal or family bleeding history  Psychiatric/Behavioral: Denies suicidal ideation, mood changes, confusion, nervousness,  and  agitation    Physical Exam: Vitals:   04/26/22 1306  BP: 110/80  Pulse: 82  Temp: (!) 97.4 F (36.3 C)  TempSrc: Oral  SpO2: 98%  Weight: 162 lb 6.4 oz (73.7 kg)  Height: 5' 5.5" (1.664 m)    Body mass index is 26.61 kg/m.   Constitutional: NAD, calm, comfortable Eyes: PERRL, lids and conjunctivae normal ENMT: Mucous membranes are moist. Posterior pharynx clear of any exudate or lesions. Normal dentition. Tympanic membrane is pearly white, no erythema or bulging. Neck: normal, supple, no masses, no thyromegaly Respiratory: clear to auscultation bilaterally, no wheezing, no crackles. Normal respiratory effort. No accessory muscle use.  Cardiovascular: Regular rate and rhythm, no murmurs / rubs / gallops. No extremity edema.  2+ pedal pulses. No carotid bruits.  Abdomen: no tenderness, no masses palpated. No hepatosplenomegaly. Bowel sounds positive.  Musculoskeletal: no clubbing / cyanosis. No joint deformity upper and lower extremities. Good ROM, no contractures. Normal muscle tone.  Skin: no rashes, lesions, ulcers. No induration Neurologic: CN 2-12 grossly intact. Sensation intact, DTR normal. Strength 5/5 in all 4.  Psychiatric: Normal judgment and insight. Alert and oriented x 3. Normal mood.     Impression and Plan:  Encounter for preventive health examination  Needs flu shot - Plan: Flu Vaccine QUAD 6+ mos PF IM (Fluarix Quad PF)  Insomnia, unspecified type - Plan: CBC with Differential/Platelet, Comprehensive metabolic panel, Hemoglobin A1c, Lipid panel, TSH, Vitamin B12, VITAMIN D 25 Hydroxy (Vit-D Deficiency, Fractures)  Cigarette nicotine dependence without complication - Plan: Ambulatory Referral Lung Cancer Screening [facility]  -Recommend routine eye and dental care. -Immunizations: Flu vaccine in office today, advised to update COVID and RSV at pharmacy. -Healthy lifestyle discussed in detail. -Labs to be updated today. -Colon cancer screening:  08/2021 -Breast cancer screening: 07/2021 -Cervical cancer screening: 08/2020 -Lung cancer screening: Referred for screening -Prostate cancer screening: Not applicable -DEXA: Not applicable  -We had discussed sleep hygiene in great detail.     Patient Instructions  -Nice seeing you today!!  -Lab work today; will notify you once results are available.  -Remember COVID and RSV vaccines.  -Schedule follow up in 1 year or sooner as needed.      Lelon Frohlich, MD [facility]

## 2022-04-27 ENCOUNTER — Other Ambulatory Visit: Payer: Self-pay | Admitting: *Deleted

## 2022-04-27 ENCOUNTER — Encounter: Payer: Self-pay | Admitting: Internal Medicine

## 2022-04-27 DIAGNOSIS — R7303 Prediabetes: Secondary | ICD-10-CM

## 2022-04-27 DIAGNOSIS — E785 Hyperlipidemia, unspecified: Secondary | ICD-10-CM | POA: Insufficient documentation

## 2022-04-27 DIAGNOSIS — R7302 Impaired glucose tolerance (oral): Secondary | ICD-10-CM | POA: Insufficient documentation

## 2022-05-16 DIAGNOSIS — M8580 Other specified disorders of bone density and structure, unspecified site: Secondary | ICD-10-CM

## 2022-05-16 HISTORY — DX: Other specified disorders of bone density and structure, unspecified site: M85.80

## 2022-05-29 ENCOUNTER — Other Ambulatory Visit: Payer: Self-pay | Admitting: Internal Medicine

## 2022-05-29 DIAGNOSIS — E785 Hyperlipidemia, unspecified: Secondary | ICD-10-CM

## 2022-06-07 NOTE — Progress Notes (Signed)
This encounter was created in error - please disregard.

## 2022-06-12 ENCOUNTER — Other Ambulatory Visit: Payer: Self-pay | Admitting: Internal Medicine

## 2022-07-13 ENCOUNTER — Other Ambulatory Visit: Payer: Self-pay | Admitting: *Deleted

## 2022-07-13 DIAGNOSIS — F17210 Nicotine dependence, cigarettes, uncomplicated: Secondary | ICD-10-CM

## 2022-07-13 DIAGNOSIS — Z122 Encounter for screening for malignant neoplasm of respiratory organs: Secondary | ICD-10-CM

## 2022-07-13 DIAGNOSIS — Z87891 Personal history of nicotine dependence: Secondary | ICD-10-CM

## 2022-08-03 ENCOUNTER — Ambulatory Visit (INDEPENDENT_AMBULATORY_CARE_PROVIDER_SITE_OTHER): Payer: BC Managed Care – PPO | Admitting: Pulmonary Disease

## 2022-08-03 ENCOUNTER — Encounter: Payer: Self-pay | Admitting: Pulmonary Disease

## 2022-08-03 DIAGNOSIS — Z87891 Personal history of nicotine dependence: Secondary | ICD-10-CM | POA: Diagnosis not present

## 2022-08-03 DIAGNOSIS — Z122 Encounter for screening for malignant neoplasm of respiratory organs: Secondary | ICD-10-CM

## 2022-08-03 NOTE — Progress Notes (Signed)
  Virtual Visit via Telephone Note  I connected with [name] on 08/03/22 at  9:30 AM EDT by telephone and verified that I am speaking with the correct person using two identifiers.  Location: Patient: Home Provider: [address] [place] [address]    Shared Decision Making Visit Lung Cancer Screening Program [phone])   Eligibility: Age 62 y.o. Pack Years Smoking History Calculation 32 (# packs/per year x # years smoked) Recent History of coughing up blood  no Unexplained weight loss? no ( >Than 15 pounds within the last 6 months ) Prior History Lung / other cancer yes - skin cancer (late 2022) - removed not chemo / radiation, followed by derm (Diagnosis within the last 5 years already requiring surveillance chest CT Scans). Smoking Status Current Smoker 0.5 - 0.75ppd  Visit Components: Discussion included one or more decision making aids. yes Discussion included risk/benefits of screening. yes Discussion included potential follow up diagnostic testing for abnormal scans. yes Discussion included meaning and risk of over diagnosis. yes Discussion included meaning and risk of False Positives. yes Discussion included meaning of total radiation exposure. yes  Counseling Included: Importance of adherence to annual lung cancer LDCT screening. yes Impact of comorbidities on ability to participate in the program. yes Ability and willingness to under diagnostic treatment. yes  Smoking Cessation Counseling: Current Smokers:  Discussed importance of smoking cessation. yes Information about tobacco cessation classes and interventions provided to patient. yes Patient provided with "ticket" for LDCT Scan. yes Asymptomatic Patient yes  Counseling (Intermediate counseling: > three minutes counseling) [id number]  Patient provided with "ticket" for LDCT Scan. yes Written Order for Lung Cancer Screening with LDCT placed in Epic. Yes (CT Chest Lung Cancer  Screening Low Dose W/O CM) [id number] Z12.2-Screening of respiratory organs Z87.891-Personal history of nicotine dependence   Lauraine Rinne, NP

## 2022-08-03 NOTE — Patient Instructions (Addendum)
Thank you for participating in the [place] Lung Cancer Screening Program. It was our pleasure to meet you today. We will call you with the results of your scan within the next few days. Your scan will be assigned a Lung RADS category score by the physicians reading the scans.  This Lung RADS score determines follow up scanning.  See below for description of categories, and follow up screening recommendations. We will be in touch to schedule your follow up screening annually or based on recommendations of our providers. We will fax a copy of your scan results to your Primary Care Physician, or the physician who referred you to the program, to ensure they have the results. Please call the office if you have any questions or concerns regarding your scanning experience or results.  Our office number is [phone]. Please speak with Denise Phelps, RN. , or  Denise Buckner RN, They are  our Lung Cancer Screening RN.'s If They are unavailable when you call, Please leave a message on the voice mail. We will return your call at our earliest convenience.This voice mail is monitored several times a day.  Remember, if your scan is normal, we will scan you annually as long as you continue to meet the criteria for the program. (Age 50-80, Current smoker or smoker who has quit within the last 15 years). If you are a smoker, remember, quitting is the single most powerful action that you can take to decrease your risk of lung cancer and other pulmonary, breathing related problems. We know quitting is hard, and we are here to help.  Please let us know if there is anything we can do to help you meet your goal of quitting. If you are a former smoker, congratulations. We are proud of you! Remain smoke free! Remember you can refer friends or family members through the number above.  We will screen them to make sure they meet criteria for the program. Thank you for helping us take better care of you by  participating in Lung Screening.  You can receive free nicotine replacement therapy ( patches, gum or mints) by calling 1-800-QUIT NOW. Please call so we can get you on the path to becoming  a non-smoker. I know it is hard, but you can do this!  Lung RADS Categories:  Lung RADS 1: no nodules or definitely non-concerning nodules.  Recommendation is for a repeat annual scan in 12 months.  Lung RADS 2:  nodules that are non-concerning in appearance and behavior with a very low likelihood of becoming an active cancer. Recommendation is for a repeat annual scan in 12 months.  Lung RADS 3: nodules that are probably non-concerning , includes nodules with a low likelihood of becoming an active cancer.  Recommendation is for a 6-month repeat screening scan. Often noted after an upper respiratory illness. We will be in touch to make sure you have no questions, and to schedule your 6-month scan.  Lung RADS 4 A: nodules with concerning findings, recommendation is most often for a follow up scan in 3 months or additional testing based on our provider's assessment of the scan. We will be in touch to make sure you have no questions and to schedule the recommended 3 month follow up scan.  Lung RADS 4 B:  indicates findings that are concerning. We will be in touch with you to schedule additional diagnostic testing based on our provider's  assessment of the scan.  Other options for assistance in smoking cessation (   As covered by your insurance benefits)  Hypnosis for smoking cessation  [employer]. [phone]  Acupuncture for smoking cessation  [employer] [phone]     We recommend that you stop smoking.  >>>You need to set a quit date >>>If you have friends or family who smoke, let them know you are trying to quit and not to smoke around you or in your living environment  Smoking Cessation Resources:  1 800 QUIT NOW  >>> Patient to call this resource and utilize it to  help support her quit smoking >>> Keep up your hard work with stopping smoking  You can also contact the [facility] >>>For smoking cessation classes call [phone]  We do not recommend using e-cigarettes as a form of stopping smoking  You can sign up for smoking cessation support texts and information:  >>>https://smokefree.gov/smokefreetxt

## 2022-08-04 ENCOUNTER — Ambulatory Visit (HOSPITAL_BASED_OUTPATIENT_CLINIC_OR_DEPARTMENT_OTHER)
Admission: RE | Admit: 2022-08-04 | Discharge: 2022-08-04 | Disposition: A | Discharge status: Home / Self Care | Payer: BC Managed Care – PPO | Source: Ambulatory Visit | Attending: Acute Care | Admitting: Acute Care

## 2022-08-04 DIAGNOSIS — Z122 Encounter for screening for malignant neoplasm of respiratory organs: Secondary | ICD-10-CM | POA: Insufficient documentation

## 2022-08-04 DIAGNOSIS — Z87891 Personal history of nicotine dependence: Secondary | ICD-10-CM | POA: Diagnosis present

## 2022-08-04 DIAGNOSIS — F17210 Nicotine dependence, cigarettes, uncomplicated: Secondary | ICD-10-CM | POA: Diagnosis present

## 2022-08-08 ENCOUNTER — Encounter: Payer: Self-pay | Admitting: Internal Medicine

## 2022-08-08 ENCOUNTER — Other Ambulatory Visit: Payer: Self-pay | Admitting: Acute Care

## 2022-08-08 DIAGNOSIS — I739 Peripheral vascular disease, unspecified: Secondary | ICD-10-CM

## 2022-08-08 DIAGNOSIS — F17210 Nicotine dependence, cigarettes, uncomplicated: Secondary | ICD-10-CM

## 2022-08-08 DIAGNOSIS — Z122 Encounter for screening for malignant neoplasm of respiratory organs: Secondary | ICD-10-CM

## 2022-08-08 DIAGNOSIS — Z87891 Personal history of nicotine dependence: Secondary | ICD-10-CM

## 2022-08-16 ENCOUNTER — Encounter: Payer: Self-pay | Admitting: Internal Medicine

## 2022-08-22 ENCOUNTER — Telehealth: Payer: Self-pay | Admitting: Internal Medicine

## 2022-08-22 ENCOUNTER — Ambulatory Visit: Payer: BC Managed Care – PPO | Admitting: Family Medicine

## 2022-08-22 ENCOUNTER — Encounter: Payer: Self-pay | Admitting: Family Medicine

## 2022-08-22 VITALS — BP 110/78 | HR 80 | Temp 97.8°F | Wt 158.8 lb

## 2022-08-22 DIAGNOSIS — S0181XD Laceration without foreign body of other part of head, subsequent encounter: Secondary | ICD-10-CM

## 2022-08-22 MED ORDER — CEPHALEXIN 500 MG PO CAPS: 500.0000 mg | ORAL_CAPSULE | Freq: Three times a day (TID) | ORAL | 0 refills | Status: AC

## 2022-08-22 NOTE — Telephone Encounter (Addendum)
Pt was seen today for stitches removal and would like to know when can she start working out again

## 2022-08-22 NOTE — Progress Notes (Signed)
   Subjective:    Patient ID: [name], female    DOB: 1961/02/04, 62 y.o.   MRN: [id number]  HPI Here to remove some sutures on her face that were placed on 08-12-22 while she was vacationing in [place], [place]. While there she was lying on a bad with her 65 year old dog, when the dog suddenly bit her face. The dog is fully vaccinated, and she thinks she may have put pressure on one of its arthritic joints, causing some pain. She went to an ED there, where they closed the wounds (3) with sutures. They gave her 7 days of Augmentin, which she just finished. The wounds are still sore, but she has no major pain. She has made an appt with her plastic surgeon, Dr. Foster Simpson, for 08-29-22, and she asks [place] to put in a referral. She has been dressing the wounds with neosporin.    Review of Systems  Constitutional: Negative.   Respiratory: Negative.    Cardiovascular: Negative.   Skin:  Positive for wound.       Objective:   Physical Exam Constitutional:      General: She is not in acute distress.    Appearance: Normal appearance.  Cardiovascular:     Rate and Rhythm: Normal rate and regular rhythm.     Pulses: Normal pulses.     Heart sounds: Normal heart sounds.  Pulmonary:     Effort: Pulmonary effort is normal.     Breath sounds: Normal breath sounds.  Skin:    Comments: There are 3 small lacerations on her face. One is along the right edge of her nose, it has 3 sutures present. This looks clean. There is another wound on the bridge of the nose with one suture in it, it also looks clean. Finally there is a 6 mm laceration on the left side of the nose with one suture in place. This wound is still partially open, and there is some slight swelling and erythema around it.   Neurological:     Mental Status: She is alert.           Assessment & Plan:  Lacerations from a dog bite. All remaining sutures were removed. We will cover her with 10 days of Keflex. I advised  her to cover the wounds with Vaseline now rather than neosporin. We will put in a referral to see Dr. Ulice Bold as above.  Gershon Crane, MD

## 2022-08-22 NOTE — Telephone Encounter (Signed)
Spoke with pt verbalized understanding of Dr Fry advise 

## 2022-08-22 NOTE — Telephone Encounter (Signed)
I think she can resume low intensity workouts any time now (but not swimming)

## 2022-08-23 ENCOUNTER — Ambulatory Visit
Admission: RE | Admit: 2022-08-23 | Discharge: 2022-08-23 | Disposition: A | Discharge status: Home / Self Care | Payer: BC Managed Care – PPO | Source: Ambulatory Visit | Attending: Internal Medicine | Admitting: Internal Medicine

## 2022-08-23 ENCOUNTER — Other Ambulatory Visit: Payer: Self-pay | Admitting: Internal Medicine

## 2022-08-23 DIAGNOSIS — Z1231 Encounter for screening mammogram for malignant neoplasm of breast: Secondary | ICD-10-CM

## 2022-08-29 ENCOUNTER — Encounter: Payer: Self-pay | Admitting: Internal Medicine

## 2022-08-29 ENCOUNTER — Ambulatory Visit: Payer: BC Managed Care – PPO | Admitting: Internal Medicine

## 2022-08-29 VITALS — BP 134/90 | HR 87 | Temp 98.2°F | Wt 159.2 lb

## 2022-08-29 DIAGNOSIS — F17210 Nicotine dependence, cigarettes, uncomplicated: Secondary | ICD-10-CM | POA: Diagnosis not present

## 2022-08-29 MED ORDER — VARENICLINE TARTRATE (STARTER) 0.5 MG X 11 & 1 MG X 42 PO TBPK: ORAL_TABLET | ORAL | 0 refills | Status: DC

## 2022-08-29 MED ORDER — VARENICLINE TARTRATE 1 MG PO TABS
1.0000 mg | ORAL_TABLET | Freq: Two times a day (BID) | ORAL | 1 refills | Status: DC
Start: 2022-08-29 — End: 2022-09-21

## 2022-08-29 NOTE — Progress Notes (Signed)
Established Patient Office Visit     CC/Reason for Visit: Discuss smoking cessation  HPI: [name] is a 62 y.o. female who is coming in today for the above mentioned reasons.  She is here today to discuss smoking cessation.  She is currently smoking about 1/2 pack a day.  She tried Chantix in the past and would like to try the same again.   Past Medical/Surgical History: Past Medical History:  Diagnosis Date   Anxiety    takes Xanax prn    Asthma    Back pain    DDD   Cancer 2023   ankle - basal cell carcinoma   Constipation    COVID-19 04/25/2021   flu-like symptoms   Depression    takes Effexor daily   Genital herpes    GERD (gastroesophageal reflux disease)    takes Omeprazole daily   Headache    hx of migraines, sinus headaches   Hemorrhoids    History of abnormal cervical Pap smear    age 70--hx conization-paps normal since   History of bronchitis    last time 3months ago   History of colon polyps    History of hiatal hernia 07/2021   5cm per 08/11/21 EGD   History of migraine    last one 55yrs ago--with aura   HSV-1 infection    Hyperlipidemia    not on meds at present and Dr.McKenzie will review after surgery   Insomnia    Peripheral vascular disease    PONV (postoperative nausea and vomiting)    PUD (peptic ulcer disease)    Weakness    bil legs, from pvd, resolved after aortobifemoral bypass in 2014   Wears glasses     Past Surgical History:  Procedure Laterality Date   ANKLE SURGERY Left 1986   with 3 screws and 2 pens   AORTA - BILATERAL FEMORAL ARTERY BYPASS GRAFT N/A 01/30/2013   Procedure: AORTA BIFEMORAL BYPASS GRAFT;  Surgeon: Pryor Ochoa, MD;  Location: [facility];  Service: Vascular;  Laterality: N/A;   BREAST SURGERY  02/2020   breast reduction    CARDIOVASCULAR STRESS TEST  01/21/2013   low risk stress test, no ischemia   CERVICAL CONE BIOPSY     age 58   COLONOSCOPY  11/11/2017   polyps, diverticulosis   COLONOSCOPY   08/11/2021   colon polyps, anal papillae (AIN)   ECTOPIC PREGNANCY SURGERY     x 2   ESOPHAGOGASTRODUODENOSCOPY  08/11/2021   5 cm hiatal hernia   left fallopian tube removed     25 years ago as of 10/26/21   MASS EXCISION N/A 11/01/2021   Procedure: EXCISION OF ANAL CANAL LESION UNDER ANOSCOPY;  Surgeon: Andria Meuse, MD;  Location: [facility];  Service: General;  Laterality: N/A;   RECTAL EXAM UNDER ANESTHESIA N/A 11/01/2021   Procedure: ANORECTAL EXAM UNDER ANESTHESIA;  Surgeon: Andria Meuse, MD;  Location: [facility];  Service: General;  Laterality: N/A;   wisdom teeth extracted      when patient was 14 years    Social History:  reports that she has been smoking cigarettes. She has a 8.75 pack-year smoking history. She has never used smokeless tobacco. She reports current alcohol use of about 2.0 standard drinks of alcohol per week. She reports that she does not use drugs.  Allergies: Allergies  Allergen Reactions   Lipitor [Atorvastatin]     Muscle aches  Septra [Sulfamethoxazole-Trimethoprim] Other (See Comments)    Unknown    Simvastatin     Muscle cramps     Family History:  Family History  Problem Relation Age of Onset   Hyperlipidemia Mother    Heart attack Maternal Grandfather    Hypertension Paternal Grandmother    Breast cancer Other    Colon cancer Neg Hx    Esophageal cancer Neg Hx    Stomach cancer Neg Hx    Rectal cancer Neg Hx      Current Outpatient Medications:    ALPRAZolam (XANAX) 0.5 MG tablet, Take 1 tablet (0.5 mg total) by mouth at bedtime as needed for sleep., Disp: 30 tablet, Rfl: 1   aspirin 81 MG chewable tablet, Chew 1 tablet (81 mg total) by mouth daily., Disp: , Rfl:    Aspirin-Caffeine (BAYER BACK & BODY PO), Take by mouth as needed., Disp: , Rfl:    cephALEXin (KEFLEX) 500 MG capsule, Take 1 capsule (500 mg total) by mouth 3 (three) times daily for 10 days., Disp: 30 capsule, Rfl: 0    Nutritional Supplements (BIFIDO-GENIC GROWTH FACTORS PO), Take 4 tablets by mouth daily., Disp: , Rfl:    pantoprazole (PROTONIX) 40 MG tablet, Take 1 tablet (40 mg total) by mouth daily., Disp: 90 tablet, Rfl: 3   rosuvastatin (CRESTOR) 20 MG tablet, TAKE 1 TABLET BY MOUTH EVERY DAY, Disp: 90 tablet, Rfl: 1   valACYclovir (VALTREX) 500 MG tablet, TAKE 1 TABLET (500 MG TOTAL) BY MOUTH AS NEEDED., Disp: 30 tablet, Rfl: 5   varenicline (CHANTIX) 1 MG tablet, Take 1 tablet (1 mg total) by mouth 2 (two) times daily., Disp: 60 tablet, Rfl: 1   Varenicline Tartrate, Starter, 0.5 MG X 11 & 1 MG X 42 TBPK, Take as directed, Disp: 1 each, Rfl: 0   venlafaxine XR (EFFEXOR-XR) 75 MG 24 hr capsule, TAKE 1 CAPSULE BY MOUTH DAILY WITH BREAKFAST., Disp: 90 capsule, Rfl: 1  Review of Systems:  Negative unless indicated in HPI.   Physical Exam: Vitals:   08/29/22 1440 08/29/22 1443  BP: (!) 132/93 (!) 134/90  Pulse: 87   Temp: 98.2 F (36.8 C)   TempSrc: Oral   SpO2: 97%   Weight: 159 lb 3.2 oz (72.2 kg)     Body mass index is 24.93 kg/m.   Physical Exam Vitals reviewed.  Constitutional:      Appearance: Normal appearance.  HENT:     Head: Normocephalic and atraumatic.  Eyes:     Conjunctiva/sclera: Conjunctivae normal.     Pupils: Pupils are equal, round, and reactive to light.  Cardiovascular:     Rate and Rhythm: Normal rate and regular rhythm.  Pulmonary:     Effort: Pulmonary effort is normal.     Breath sounds: Normal breath sounds.  Skin:    General: Skin is warm and dry.  Neurological:     General: No focal deficit present.     Mental Status: She is alert and oriented to person, place, and time.  Psychiatric:        Mood and Affect: Mood normal.        Behavior: Behavior normal.        Thought Content: Thought content normal.        Judgment: Judgment normal.      Impression and Plan:  Cigarette nicotine dependence without complication - Plan: Varenicline Tartrate,  Starter, 0.5 MG X 11 & 1 MG X 42 TBPK, varenicline (CHANTIX) 1 MG tablet  -  Prescribed Chantix, have advised that she arrange CBT, she will also take smoking cessation classes at [facility].   Time spent:30 minutes reviewing chart, interviewing and examining patient and formulating plan of care.     Chaya Jan, MD [facility] at [facility]

## 2022-08-30 ENCOUNTER — Ambulatory Visit: Payer: BC Managed Care – PPO | Admitting: Plastic Surgery

## 2022-08-30 ENCOUNTER — Encounter: Payer: Self-pay | Admitting: Plastic Surgery

## 2022-08-30 VITALS — HR 83 | Ht 67.0 in | Wt 161.4 lb

## 2022-08-30 DIAGNOSIS — W540XXA Bitten by dog, initial encounter: Secondary | ICD-10-CM

## 2022-08-30 DIAGNOSIS — S0125XA Open bite of nose, initial encounter: Secondary | ICD-10-CM | POA: Diagnosis not present

## 2022-08-30 NOTE — Progress Notes (Signed)
   Subjective:    Patient ID: [name], female    DOB: 08-Jun-1960, 62 y.o.   MRN: [id number]  The patient is a 62 year old female known to the practice.  She is here because she was bitten by her dog 2 weeks ago.  She had stitches placed for repair in the emergency room.  The stitches appear to all have been removed.  She is healing really well on the right side of the nose.  There is a little irritation on the left side with some redness.  Nothing looks infected.    Review of Systems  Constitutional: Negative.   Eyes: Negative.   Respiratory: Negative.    Cardiovascular: Negative.   Gastrointestinal: Negative.   Endocrine: Negative.   Genitourinary: Negative.        Objective:   Physical Exam Vitals and nursing note reviewed.  Constitutional:      Appearance: Normal appearance.  HENT:     Head: Normocephalic.     Nose:   Cardiovascular:     Rate and Rhythm: Normal rate.  Pulmonary:     Effort: Pulmonary effort is normal.  Musculoskeletal:        General: Swelling, tenderness and signs of injury present.  Skin:    Capillary Refill: Capillary refill takes less than 2 seconds.     Coloration: Skin is not jaundiced or pale.     Findings: Bruising and erythema present.  Neurological:     Mental Status: She is alert and oriented to person, place, and time.  Psychiatric:        Mood and Affect: Mood normal.        Behavior: Behavior normal.        Thought Content: Thought content normal.        Judgment: Judgment normal.         Assessment & Plan:     ICD-10-CM   1. Dog bite of nose, initial encounter  S01.25XA    W54.0XXA        I think she is ultimately going to heal really well.  I recommended Vashe twice a day for the next week and then some Vaseline.  Once the skin heals then sunblock and hat while outside.  Follow-up as needed.  Pictures were obtained of the patient and placed in the chart with the patient's or guardian's permission.

## 2022-09-05 ENCOUNTER — Encounter: Payer: Self-pay | Admitting: Obstetrics and Gynecology

## 2022-09-05 ENCOUNTER — Ambulatory Visit (INDEPENDENT_AMBULATORY_CARE_PROVIDER_SITE_OTHER): Payer: BC Managed Care – PPO | Admitting: Obstetrics and Gynecology

## 2022-09-05 VITALS — BP 126/84 | Ht 66.25 in | Wt 159.0 lb

## 2022-09-05 DIAGNOSIS — Z720 Tobacco use: Secondary | ICD-10-CM | POA: Diagnosis not present

## 2022-09-05 DIAGNOSIS — Z780 Asymptomatic menopausal state: Secondary | ICD-10-CM | POA: Diagnosis not present

## 2022-09-05 DIAGNOSIS — Z01419 Encounter for gynecological examination (general) (routine) without abnormal findings: Secondary | ICD-10-CM

## 2022-09-05 DIAGNOSIS — R1031 Right lower quadrant pain: Secondary | ICD-10-CM

## 2022-09-05 NOTE — Progress Notes (Signed)
62 y.o. [id number] Married Caucasian female here for annual exam. Pt feels like something moves over on her right side during intercourse entry.  Feels discomfort but it goes away.  Had not been using lubricant until recently.   Hx aortobifemoral bypass 10 years ago.  Will see her cardiologist next week.  Had minor vaginal bleeding with sex. No other vaginal bleeding.   Is planning smoking cessation. Will start Chantix this week.  She will seek a therapist and is doing program through [employer].    Had a recent dog bite.   PCP:   Dr. Ardyth Harps  Patient's last menstrual period was 10/31/2010.           Sexually active: Yes.    The current method of family planning is post menopausal status.    Exercising: Yes.     Trainer 3x a week, walk dog 2 miles a day, hiking Smoker:  yes  Health Maintenance: Pap:  08/20/20 neg: HR HPV neg History of abnormal Pap:  yes, Hx of conization in her 20's  MMG:  08/23/22 Breast Density Cat B, BI-RADS CAT 1 neg Colonoscopy:  09/08/21 BMD:   08/19/02  Result  normal TDaP:  08/04/20 Gardasil:   no HIV: neg in preg Hep C: unsure Screening Labs:  PCP and cardiology.   reports that she has been smoking cigarettes. She has a 8.75 pack-year smoking history. She has never used smokeless tobacco. She reports current alcohol use of about 2.0 standard drinks of alcohol per week. She reports that she does not use drugs.  Past Medical History:  Diagnosis Date  . Anxiety    takes Xanax prn   . Asthma   . Back pain    DDD  . Cancer 2023   ankle - basal cell carcinoma  . Constipation   . COVID-19 04/25/2021   flu-like symptoms  . Depression    takes Effexor daily  . Genital herpes   . GERD (gastroesophageal reflux disease)    takes Omeprazole daily  . Headache    hx of migraines, sinus headaches  . Hemorrhoids   . History of abnormal cervical Pap smear    age 89--hx conization-paps normal since  . History of bronchitis    last time 3months ago  .  History of colon polyps   . History of hiatal hernia 07/2021   5cm per 08/11/21 EGD  . History of migraine    last one 28yrs ago--with aura  . HSV-1 infection   . Hyperlipidemia    not on meds at present and Dr.McKenzie will review after surgery  . Insomnia   . Peripheral vascular disease   . PONV (postoperative nausea and vomiting)   . PUD (peptic ulcer disease)   . Weakness    bil legs, from pvd, resolved after aortobifemoral bypass in 2014  . Wears glasses     Past Surgical History:  Procedure Laterality Date  . AIN I  11/05/2021  . 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 ANKLE SURGERY Left 1986   with 3 screws and 2 pens  . AORTA - BILATERAL FEMORAL ARTERY BYPASS GRAFT N/A 01/30/2013   Procedure: AORTA BIFEMORAL BYPASS GRAFT;  Surgeon: Pryor Ochoa, MD;  Location: [facility] OR;  Service: Vascular;  Laterality: N/A;  . BREAST SURGERY  02/2020   breast reduction   . CARDIOVASCULAR STRESS TEST  01/21/2013   low risk stress test, no ischemia  . CERVICAL CONE BIOPSY  age 61  . COLONOSCOPY  11/11/2017   polyps, diverticulosis  . COLONOSCOPY  08/11/2021   colon polyps, anal papillae (AIN)  . ECTOPIC PREGNANCY SURGERY     x 2  . ESOPHAGOGASTRODUODENOSCOPY  08/11/2021   5 cm hiatal hernia  . left fallopian tube removed     25 years ago as of 10/26/21  . MASS EXCISION N/A 11/01/2021   Procedure: EXCISION OF ANAL CANAL LESION UNDER ANOSCOPY;  Surgeon: Andria Meuse, MD;  Location: [facility];  Service: General;  Laterality: N/A;  . RECTAL EXAM UNDER ANESTHESIA N/A 11/01/2021   Procedure: ANORECTAL EXAM UNDER ANESTHESIA;  Surgeon: Andria Meuse, MD;  Location: [facility];  Service: General;  Laterality: N/A;  . wisdom teeth extracted      when patient was 14 years    Current Outpatient Medications  Medication Sig Dispense Refill  . ALPRAZolam (XANAX) 0.5 MG tablet Take 1 tablet (0.5 mg total) by mouth at bedtime as needed for sleep. 30  tablet 1  . aspirin 81 MG chewable tablet Chew 1 tablet (81 mg total) by mouth daily.    . Aspirin-Caffeine (BAYER BACK & BODY PO) Take by mouth as needed.    . Nutritional Supplements (BIFIDO-GENIC GROWTH FACTORS PO) Take 4 tablets by mouth daily.    . pantoprazole (PROTONIX) 40 MG tablet Take 1 tablet (40 mg total) by mouth daily. 90 tablet 3  . rosuvastatin (CRESTOR) 20 MG tablet TAKE 1 TABLET BY MOUTH EVERY DAY 90 tablet 1  . valACYclovir (VALTREX) 500 MG tablet TAKE 1 TABLET (500 MG TOTAL) BY MOUTH AS NEEDED. 30 tablet 5  . Varenicline Tartrate, Starter, 0.5 MG X 11 & 1 MG X 42 TBPK Take as directed 1 each 0  . venlafaxine XR (EFFEXOR-XR) 75 MG 24 hr capsule TAKE 1 CAPSULE BY MOUTH DAILY WITH BREAKFAST. 90 capsule 1  . varenicline (CHANTIX) 1 MG tablet Take 1 tablet (1 mg total) by mouth 2 (two) times daily. (Patient not taking: Reported on 09/05/2022) 60 tablet 1   No current facility-administered medications for this visit.    Family History  Problem Relation Age of Onset  . Hyperlipidemia Mother   . Heart attack Maternal Grandfather   . Hypertension Paternal Grandmother   . Breast cancer Other   . Colon cancer Neg Hx   . Esophageal cancer Neg Hx   . Stomach cancer Neg Hx   . Rectal cancer Neg Hx     Review of Systems  All other systems reviewed and are negative.   Exam:   BP 126/84 (BP Location: Right Arm, Patient Position: Sitting, Cuff Size: Normal)   Ht 5' 6.25" (1.683 m)   Wt 159 lb (72.1 kg)   LMP 10/31/2010   BMI 25.47 kg/m     General appearance: alert, cooperative and appears stated age Head: normocephalic, without obvious abnormality, atraumatic Neck: no adenopathy, supple, symmetrical, trachea midline and thyroid normal to inspection and palpation Lungs: clear to auscultation bilaterally Breasts: consistent with bilateral reduction, no masses or tenderness, No nipple retraction or dimpling, No nipple discharge or bleeding, No axillary adenopathy Heart:  regular rate and rhythm Abdomen: soft, non-tender; no masses, no organomegaly Extremities: extremities normal, atraumatic, no cyanosis or edema Skin: skin color, texture, turgor normal. No rashes or lesions Lymph nodes: cervical, supraclavicular, and axillary nodes normal. Neurologic: grossly normal  Pelvic: External genitalia:  no lesions  No abnormal inguinal nodes palpated.              Urethra:  normal appearing urethra with no masses, tenderness or lesions              Bartholins and Skenes: normal                 Vagina: normal appearing vagina with normal color and discharge, no lesions              Cervix: no lesions              Pap taken: no Bimanual Exam:  Uterus:  normal size, contour, position, consistency, mobility, non-tender              Adnexa: no mass, fullness, tenderness              Rectal exam: yes.  Confirms.              Anus:  normal sphincter tone, no lesions  Chaperone was present for exam:  Warren Lacy, CMA  Assessment:   Well woman visit with gynecologic exam. 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  Hx ectopic pregnancy.  Status post left fallopian tube removal on pathology report 03/06/00. Status post bilateral breast reduction.  Hx breast cancer.  Maternal great grandmother.   Hx HSV 1.  Valtrex through PCP.  Status post aorto-bifem bypass.  Hx gastritis with esophagitis.   Plan: Mammogram screening discussed. Self breast awareness reviewed. Pap and HR HPV as above. Guidelines for Calcium, Vitamin D, regular exercise program including cardiovascular and weight bearing exercise. We discussed vaginal lubricants:  KY jelly, Cooking oils, Good Clean Love.  Follow up annually and prn.   After visit summary provided.   Addendum:  history of anal LGSIL noted on chart review after completion of office visit today.

## 2022-09-05 NOTE — Patient Instructions (Signed)

## 2022-09-06 ENCOUNTER — Telehealth: Payer: Self-pay | Admitting: *Deleted

## 2022-09-06 DIAGNOSIS — R1031 Right lower quadrant pain: Secondary | ICD-10-CM

## 2022-09-06 NOTE — Telephone Encounter (Signed)
I have ordered an office pelvic ultrasound for right lower quadrant pain.

## 2022-09-06 NOTE — Telephone Encounter (Signed)
Patient left message requesting return call to schedule procedure discussed during [facility]EX on 09/05/22 with Dr. Edward Jolly.   Dr. Edward Jolly -please advise.

## 2022-09-06 NOTE — Telephone Encounter (Signed)
Spoke with patient. Reviewed PUS scheduling options, will proceed with scheduling at [facility]. Patient aware she will need to schedule consult with Dr. Edward Jolly once PUS completed. Patient agreeable.   Call placed to [facility] Scheduling, spoke with [facility]   Scheduled for 09/09/22 at 5:30 PM Arrive with full bladder, drink 32 oz of water 1 hour before.   Call returned to patient. Advised as seen above. OV scheduled with Dr. Edward Jolly 09/14/22 at 1630. Patient appreciative of call.   Routing to provider for final review. Patient is agreeable to disposition. Will close encounter.

## 2022-09-09 ENCOUNTER — Ambulatory Visit (HOSPITAL_BASED_OUTPATIENT_CLINIC_OR_DEPARTMENT_OTHER)
Admission: RE | Admit: 2022-09-09 | Discharge: 2022-09-09 | Disposition: A | Discharge status: Home / Self Care | Payer: BC Managed Care – PPO | Source: Ambulatory Visit | Attending: Obstetrics and Gynecology | Admitting: Obstetrics and Gynecology

## 2022-09-09 DIAGNOSIS — R1031 Right lower quadrant pain: Secondary | ICD-10-CM | POA: Diagnosis present

## 2022-09-13 ENCOUNTER — Encounter: Payer: Self-pay | Admitting: Obstetrics and Gynecology

## 2022-09-13 ENCOUNTER — Ambulatory Visit: Payer: BC Managed Care – PPO | Admitting: Cardiovascular Disease

## 2022-09-13 ENCOUNTER — Ambulatory Visit: Payer: BC Managed Care – PPO | Attending: Cardiovascular Disease | Admitting: Cardiovascular Disease

## 2022-09-13 ENCOUNTER — Encounter: Payer: Self-pay | Admitting: Cardiovascular Disease

## 2022-09-13 VITALS — BP 134/88 | HR 78 | Ht 67.0 in | Wt 161.4 lb

## 2022-09-13 DIAGNOSIS — I739 Peripheral vascular disease, unspecified: Secondary | ICD-10-CM | POA: Diagnosis not present

## 2022-09-13 DIAGNOSIS — E785 Hyperlipidemia, unspecified: Secondary | ICD-10-CM

## 2022-09-13 DIAGNOSIS — F17200 Nicotine dependence, unspecified, uncomplicated: Secondary | ICD-10-CM | POA: Diagnosis not present

## 2022-09-13 MED ORDER — ROSUVASTATIN CALCIUM 40 MG PO TABS
40.0000 mg | ORAL_TABLET | Freq: Every day | ORAL | 3 refills | Status: DC
Start: 2022-09-13 — End: 2023-02-17

## 2022-09-13 NOTE — Assessment & Plan Note (Signed)
History of dyslipidemia on rosuvastatin 20 mg a day with lipid profile performed 04/26/2022 revealing total cholesterol 231, LDL 100 and HDL of 56, not at goal for secondary prevention.  I am going to increase her rosuvastatin to 40 mg a day and we will recheck a lipid liver profile in 3 months.

## 2022-09-13 NOTE — Assessment & Plan Note (Signed)
History of severe PAD with occluded iliacs bilaterally status post aortobifemoral bypass grafting by Dr. Betti Cruz 01/30/2013.  She is followed noninvasively at vein and vascular specialist on annual basis.  Her recent ABIs performed 11/17/2020 0.82 on the right and 1.07 on the left.  Apparently she had left popliteal disease as well.  She is very active and is not limited by vascular symptoms.

## 2022-09-13 NOTE — Progress Notes (Signed)
09/13/2022 [name]   1960-05-29  [id number]  Primary Physician Philip Aspen, Limmie Patricia, MD Primary Cardiologist: Runell Gess MD Nicholes Calamity, [place]  HPI:  [name] is a 62 y.o. thin-appearing married Caucasian female mother of 2 children, grandmother 1 grandchild referred by her PCP, Dr. Ardyth Harps, to be established because of prior vascular disease and risk factors.  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  Risk factors include over 20-pack-year tobacco abuse currently smoking 1/2 pack/day with a desire to quit as well as hyperlipidemia.  There is no family history of heart disease.  She is never had a heart attack or stroke.  She denies chest pain or shortness of breath.  She did have severe claudication and underwent aortobifemoral bypass grafting by Dr. Betti Cruz 01/30/2013.  She is followed noninvasively and the vein and vascular specialist office.   Current Meds  Medication Sig   ALPRAZolam (XANAX) 0.5 MG tablet Take 1 tablet (0.5 mg total) by mouth at bedtime as needed for sleep.   aspirin 81 MG chewable tablet Chew 1 tablet (81 mg total) by mouth daily.   Aspirin-Caffeine (BAYER BACK & BODY PO) Take by mouth as needed.   Nutritional Supplements (BIFIDO-GENIC GROWTH FACTORS PO) Take 4 tablets by mouth daily.   pantoprazole (PROTONIX) 40 MG tablet Take 1 tablet (40 mg total) by mouth daily.   rosuvastatin (CRESTOR) 20 MG tablet TAKE 1 TABLET BY MOUTH EVERY DAY   valACYclovir (VALTREX) 500 MG tablet TAKE 1 TABLET (500 MG TOTAL) BY MOUTH AS NEEDED.   varenicline (CHANTIX) 1 MG tablet Take 1 tablet (1 mg total) by mouth 2 (two) times daily.   Varenicline Tartrate, Starter, 0.5 MG X 11 & 1 MG X 42 TBPK Take as directed   venlafaxine XR (EFFEXOR-XR) 75 MG 24 hr capsule TAKE 1 CAPSULE BY MOUTH DAILY WITH BREAKFAST.     Allergies  Allergen Reactions   Lipitor [Atorvastatin]     Muscle aches    Septra [Sulfamethoxazole-Trimethoprim] Other (See Comments)    Unknown    Simvastatin     Muscle cramps     Social History   Socioeconomic History   Marital status: Married    Spouse name: Not on file   Number of children: 1   Years of education: Not on file   Highest education level: Bachelor's degree (e.g., BA, AB, BS)  Occupational History    Employer: [employer]  Tobacco Use   Smoking status: Every Day    Packs/day: 0.50    Years: 20.00    Additional pack years: 0.00    Total pack years: 10.00    Types: Cigarettes   Smokeless tobacco: Never   Tobacco comments:    Recently filled Chantix - Awaiting source of radiating leg pain origin  Vaping Use   Vaping Use: Never used  Substance and Sexual Activity   Alcohol use: Yes    Alcohol/week: 2.0 standard drinks of alcohol    Types: 2 Glasses of wine per week    Comment: weekends only   Drug use: No   Sexual activity: Yes    Birth control/protection: Post-menopausal  Other Topics Concern   Not on file  Social History Narrative   Lives at home with husband.     Social Determinants of Health   Financial Resource Strain: Low Risk  (08/18/2022)   Overall Financial Resource Strain (CARDIA)  Difficulty of Paying Living Expenses: Not hard at all  Food Insecurity: No Food Insecurity (08/18/2022)   Hunger Vital Sign    Worried About Running Out of Food in the Last Year: Never true    Ran Out of Food in the Last Year: Never true  Transportation Needs: No Transportation Needs (08/18/2022)   PRAPARE - Administrator, Civil Service (Medical): No    Lack of Transportation (Non-Medical): No  Physical Activity: Sufficiently Active (08/18/2022)   Exercise Vital Sign    Days of Exercise per Week: 5 days    Minutes of Exercise per Session: 60 min  Stress: Stress Concern Present (08/18/2022)   [employer] of Occupational Health - Occupational Stress Questionnaire    Feeling of Stress : To some extent   Social Connections: Unknown (08/18/2022)   Social Connection and Isolation Panel [NHANES]    Frequency of Communication with Friends and Family: More than three times a week    Frequency of Social Gatherings with Friends and Family: Once a week    Attends Religious Services: More than 4 times per year    Active Member of [employer] or Organizations: Patient declined    Attends Engineer, structural: Not on file    Marital Status: Married  Catering manager Violence: Not on file     Review of Systems: General: negative for chills, fever, night sweats or weight changes.  Cardiovascular: negative for chest pain, dyspnea on exertion, edema, orthopnea, palpitations, paroxysmal nocturnal dyspnea or shortness of breath Dermatological: negative for rash Respiratory: negative for cough or wheezing Urologic: negative for hematuria Abdominal: negative for nausea, vomiting, diarrhea, bright red blood per rectum, melena, or hematemesis Neurologic: negative for visual changes, syncope, or dizziness All other systems reviewed and are otherwise negative except as noted above.    Blood pressure 134/88, pulse 78, height 5\' 7"  (1.702 m), weight 161 lb 6.4 oz (73.2 kg), last menstrual period 10/31/2010, SpO2 94 %.  General appearance: alert and no distress Neck: no adenopathy, no carotid bruit, no JVD, supple, symmetrical, trachea midline, and thyroid not enlarged, symmetric, no tenderness/mass/nodules Lungs: clear to auscultation bilaterally Heart: regular rate and rhythm, S1, S2 normal, no murmur, click, rub or gallop Extremities: extremities normal, atraumatic, no cyanosis or edema Pulses: 2+ and symmetric Skin: Skin color, texture, turgor normal. No rashes or lesions Neurologic: Grossly normal  EKG sinus rhythm at 78 without ST or T wave changes.  Personally reviewed this EKG.  ASSESSMENT AND PLAN:   Peripheral vascular disease, unspecified (HCC) History of severe PAD with occluded iliacs  bilaterally status post aortobifemoral bypass grafting by Dr. Betti Cruz 01/30/2013.  She is followed noninvasively at vein and vascular specialist on annual basis.  Her recent ABIs performed 11/17/2020 0.82 on the right and 1.07 on the left.  Apparently she had left popliteal disease as well.  She is very active and is not limited by vascular symptoms.  Smoker Ongoing tobacco abuse of 1/2 pack/day for the last 45 years with a desire to quit.  She is on a tobacco cessation medication prescriber her PCP.  Hyperlipidemia History of dyslipidemia on rosuvastatin 20 mg a day with lipid profile performed 04/26/2022 revealing total cholesterol 231, LDL 100 and HDL of 56, not at goal for secondary prevention.  I am going to increase her rosuvastatin to 40 mg a day and we will recheck a lipid liver profile in 3 months.     Runell Gess MD FACP,FACC,FAHA, [facility] 09/13/2022 4:09  PM

## 2022-09-13 NOTE — Patient Instructions (Signed)
Medication Instructions:  Your physician has recommended you make the following change in your medication:   -Increase rosuvastatin (crestor) to 40mg  once daily.  *If you need a refill on your cardiac medications before your next appointment, please call your pharmacy*   Lab Work: Your physician recommends that you return for lab work in: 3 months for FASTING lipid/liver panel  If you have labs (blood work) drawn today and your tests are completely normal, you will receive your results only by: MyChart Message (if you have MyChart) OR A paper copy in the mail If you have any lab test that is abnormal or we need to change your treatment, we will call you to review the results.   Testing/Procedures: Dr. Allyson Sabal has ordered a CT coronary calcium score.   Test locations:  [facility]  [facility]  This is $99 out of pocket.   Coronary CalciumScan A coronary calcium scan is an imaging test used to look for deposits of calcium and other fatty materials (plaques) in the inner lining of the blood vessels of the heart (coronary arteries). These deposits of calcium and plaques can partly clog and narrow the coronary arteries without producing any symptoms or warning signs. This puts a person at risk for a heart attack. This test can detect these deposits before symptoms develop. Tell a health care provider about: Any allergies you have. All medicines you are taking, including vitamins, herbs, eye drops, creams, and over-the-counter medicines. Any problems you or family members have had with anesthetic medicines. Any blood disorders you have. Any surgeries you have had. Any medical conditions you have. Whether you are pregnant or may be pregnant. What are the risks? Generally, this is a safe procedure. However, problems may occur, including: Harm to a pregnant woman and her unborn baby. This test  involves the use of radiation. Radiation exposure can be dangerous to a pregnant woman and her unborn baby. If you are pregnant, you generally should not have this procedure done. Slight increase in the risk of cancer. This is because of the radiation involved in the test. What happens before the procedure? No preparation is needed for this procedure. What happens during the procedure? You will undress and remove any jewelry around your neck or chest. You will put on a hospital gown. Sticky electrodes will be placed on your chest. The electrodes will be connected to an electrocardiogram (ECG) machine to record a tracing of the electrical activity of your heart. A CT scanner will take pictures of your heart. During this time, you will be asked to lie still and hold your breath for 2-3 seconds while a picture of your heart is being taken. The procedure may vary among health care providers and hospitals. What happens after the procedure? You can get dressed. You can return to your normal activities. It is up to you to get the results of your test. Ask your health care provider, or the department that is doing the test, when your results will be ready. Summary A coronary calcium scan is an imaging test used to look for deposits of calcium and other fatty materials (plaques) in the inner lining of the blood vessels of the heart (coronary arteries). Generally, this is a safe procedure. Tell your health care provider if you are pregnant or may be pregnant. No preparation is needed for this procedure. A CT scanner will take pictures of your heart. You can return  to your normal activities after the scan is done. This information is not intended to replace advice given to you by your health care provider. Make sure you discuss any questions you have with your health care provider. Document Released: 10/29/2007 Document Revised: 03/21/2016 Document Reviewed: 03/21/2016 Elsevier Interactive Patient  Education  2017 [employer].    Follow-Up: At [facility], you and your health needs are our priority.  As part of our continuing mission to provide you with exceptional heart care, we have created designated Provider Care Teams.  These Care Teams include your primary Cardiologist (physician) and Advanced Practice Providers (APPs -  Physician Assistants and Nurse Practitioners) who all work together to provide you with the care you need, when you need it.  We recommend signing up for the patient portal called "MyChart".  Sign up information is provided on this After Visit Summary.  MyChart is used to connect with patients for Virtual Visits (Telemedicine).  Patients are able to view lab/test results, encounter notes, upcoming appointments, etc.  Non-urgent messages can be sent to your provider as well.   To learn more about what you can do with MyChart, go to [email or web address].    Your next appointment:   12 month(s)  Provider:   Nanetta Batty, MD

## 2022-09-13 NOTE — Assessment & Plan Note (Signed)
Ongoing tobacco abuse of 1/2 pack/day for the last 45 years with a desire to quit.  She is on a tobacco cessation medication prescriber her PCP.

## 2022-09-14 ENCOUNTER — Ambulatory Visit: Payer: BC Managed Care – PPO | Admitting: Obstetrics and Gynecology

## 2022-09-14 NOTE — Progress Notes (Deleted)
GYNECOLOGY  VISIT   HPI: 62 y.o.   Married  Caucasian  female   [id number] with Patient's last menstrual period was 10/31/2010.   here for   U/S consult  GYNECOLOGIC HISTORY: Patient's last menstrual period was 10/31/2010. Contraception:  PMP Menopausal hormone therapy:  n/a Last mammogram:  08/23/22 Breast Density Cat B, BI-RADS CAT 1 neg  Last pap smear:   08/20/20 neg: HR HPV neg         OB History     Gravida  2   Para  1   Term      Preterm      AB  1   Living  1      SAB      IAB      Ectopic  1   Multiple      Live Births                 Patient Active Problem List   Diagnosis Date Noted   Dog bite of nose 08/30/2022   IGT (impaired glucose tolerance) 04/27/2022   Hyperlipidemia 04/27/2022   Smoker 04/22/2021   S/P bilateral breast reduction 02/25/2020   Symptomatic mammary hypertrophy 05/03/2019   Post-op pain 02/28/2013   Atherosclerosis of native arteries of the extremities with intermittent claudication 02/26/2013   Peripheral vascular disease, unspecified (HCC) 01/15/2013    Past Medical History:  Diagnosis Date   Anxiety    takes Xanax prn    Asthma    Back pain    DDD   Cancer (HCC) 2023   ankle - basal cell carcinoma   Constipation    COVID-19 04/25/2021   flu-like symptoms   Depression    takes Effexor daily   Genital herpes    GERD (gastroesophageal reflux disease)    takes Omeprazole daily   Headache    hx of migraines, sinus headaches   Hemorrhoids    History of abnormal cervical Pap smear    age 13--hx conization-paps normal since   History of bronchitis    last time 3months ago   History of colon polyps    History of hiatal hernia 07/2021   5cm per 08/11/21 EGD   History of migraine    last one 25yrs ago--with aura   HSV-1 infection    Hyperlipidemia    not on meds at present and Dr.McKenzie will review after surgery   Insomnia    Peripheral vascular disease (HCC)    PONV (postoperative nausea and vomiting)     PUD (peptic ulcer disease)    Weakness    bil legs, from pvd, resolved after aortobifemoral bypass in 2014   Wears glasses     Past Surgical History:  Procedure Laterality Date   AIN I  11/05/2021   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   AIN I   ANKLE SURGERY Left 1986   with 3 screws and 2 pens   AORTA - BILATERAL FEMORAL ARTERY BYPASS GRAFT N/A 01/30/2013   Procedure: AORTA BIFEMORAL BYPASS GRAFT;  Surgeon: Pryor Ochoa, MD;  Location: [facility] OR;  Service: Vascular;  Laterality: N/A;   BREAST SURGERY  02/2020   breast reduction    CARDIOVASCULAR STRESS TEST  01/21/2013   low risk stress test, no ischemia   CERVICAL CONE BIOPSY     age 45   COLONOSCOPY  11/11/2017   polyps, diverticulosis   COLONOSCOPY  08/11/2021   colon polyps, anal papillae (AIN)   CORONARY ANGIOPLASTY  01/2013  Aorta bi-femoral bypass   ECTOPIC PREGNANCY SURGERY     x 2   ESOPHAGOGASTRODUODENOSCOPY  08/11/2021   5 cm hiatal hernia   left fallopian tube removed     25 years ago as of 10/26/21   MASS EXCISION N/A 11/01/2021   Procedure: EXCISION OF ANAL CANAL LESION UNDER ANOSCOPY;  Surgeon: Andria Meuse, MD;  Location: [facility];  Service: General;  Laterality: N/A;   RECTAL EXAM UNDER ANESTHESIA N/A 11/01/2021   Procedure: ANORECTAL EXAM UNDER ANESTHESIA;  Surgeon: Andria Meuse, MD;  Location: [facility];  Service: General;  Laterality: N/A;   wisdom teeth extracted      when patient was 14 years    Current Outpatient Medications  Medication Sig Dispense Refill   ALPRAZolam (XANAX) 0.5 MG tablet Take 1 tablet (0.5 mg total) by mouth at bedtime as needed for sleep. 30 tablet 1   aspirin 81 MG chewable tablet Chew 1 tablet (81 mg total) by mouth daily.     Aspirin-Caffeine (BAYER BACK & BODY PO) Take by mouth as needed.     Nutritional Supplements (BIFIDO-GENIC GROWTH FACTORS PO) Take 4 tablets by mouth daily.     pantoprazole (PROTONIX) 40 MG tablet Take 1 tablet (40  mg total) by mouth daily. 90 tablet 3   rosuvastatin (CRESTOR) 40 MG tablet Take 1 tablet (40 mg total) by mouth daily. 90 tablet 3   valACYclovir (VALTREX) 500 MG tablet TAKE 1 TABLET (500 MG TOTAL) BY MOUTH AS NEEDED. 30 tablet 5   varenicline (CHANTIX) 1 MG tablet Take 1 tablet (1 mg total) by mouth 2 (two) times daily. 60 tablet 1   Varenicline Tartrate, Starter, 0.5 MG X 11 & 1 MG X 42 TBPK Take as directed 1 each 0   venlafaxine XR (EFFEXOR-XR) 75 MG 24 hr capsule TAKE 1 CAPSULE BY MOUTH DAILY WITH BREAKFAST. 90 capsule 1   No current facility-administered medications for this visit.     ALLERGIES: Lipitor [atorvastatin], Septra [sulfamethoxazole-trimethoprim], and Simvastatin  Family History  Problem Relation Age of Onset   Hyperlipidemia Mother    Asthma Maternal Grandmother    Heart attack Maternal Grandfather    Heart disease Maternal Grandfather    Hypertension Maternal Grandfather    Hypertension Paternal Grandmother    Breast cancer Other    Colon cancer Neg Hx    Esophageal cancer Neg Hx    Stomach cancer Neg Hx    Rectal cancer Neg Hx     Social History   Socioeconomic History   Marital status: Married    Spouse name: Not on file   Number of children: 1   Years of education: Not on file   Highest education level: Bachelor's degree (e.g., BA, AB, BS)  Occupational History    Employer: [employer]  Tobacco Use   Smoking status: Every Day    Packs/day: 0.50    Years: 20.00    Additional pack years: 0.00    Total pack years: 10.00    Types: Cigarettes   Smokeless tobacco: Never   Tobacco comments:    Recently filled Chantix - Awaiting source of radiating leg pain origin  Vaping Use   Vaping Use: Never used  Substance and Sexual Activity   Alcohol use: Yes    Alcohol/week: 2.0 standard drinks of alcohol    Types: 2 Glasses of wine per week    Comment: weekends only   Drug use: No   Sexual  activity: Yes    Birth control/protection:  Post-menopausal  Other Topics Concern   Not on file  Social History Narrative   Lives at home with husband.     Social Determinants of Health   Financial Resource Strain: Low Risk  (08/18/2022)   Overall Financial Resource Strain (CARDIA)    Difficulty of Paying Living Expenses: Not hard at all  Food Insecurity: No Food Insecurity (08/18/2022)   Hunger Vital Sign    Worried About Running Out of Food in the Last Year: Never true    Ran Out of Food in the Last Year: Never true  Transportation Needs: No Transportation Needs (08/18/2022)   PRAPARE - Administrator, Civil Service (Medical): No    Lack of Transportation (Non-Medical): No  Physical Activity: Sufficiently Active (08/18/2022)   Exercise Vital Sign    Days of Exercise per Week: 5 days    Minutes of Exercise per Session: 60 min  Stress: Stress Concern Present (08/18/2022)   [employer] of Occupational Health - Occupational Stress Questionnaire    Feeling of Stress : To some extent  Social Connections: Unknown (08/18/2022)   Social Connection and Isolation Panel [NHANES]    Frequency of Communication with Friends and Family: More than three times a week    Frequency of Social Gatherings with Friends and Family: Once a week    Attends Religious Services: More than 4 times per year    Active Member of [employer] or Organizations: Patient declined    Attends Banker Meetings: Not on file    Marital Status: Married  Intimate Partner Violence: Not on file    Review of Systems  PHYSICAL EXAMINATION:    LMP 10/31/2010     General appearance: alert, cooperative and appears stated age Head: Normocephalic, without obvious abnormality, atraumatic Neck: no adenopathy, supple, symmetrical, trachea midline and thyroid normal to inspection and palpation Lungs: clear to auscultation bilaterally Breasts: normal appearance, no masses or tenderness, No nipple retraction or dimpling, No nipple discharge or bleeding, No  axillary or supraclavicular adenopathy Heart: regular rate and rhythm Abdomen: soft, non-tender, no masses,  no organomegaly Extremities: extremities normal, atraumatic, no cyanosis or edema Skin: Skin color, texture, turgor normal. No rashes or lesions Lymph nodes: Cervical, supraclavicular, and axillary nodes normal. No abnormal inguinal nodes palpated Neurologic: Grossly normal  Pelvic: External genitalia:  no lesions              Urethra:  normal appearing urethra with no masses, tenderness or lesions              Bartholins and Skenes: normal                 Vagina: normal appearing vagina with normal color and discharge, no lesions              Cervix: no lesions                Bimanual Exam:  Uterus:  normal size, contour, position, consistency, mobility, non-tender              Adnexa: no mass, fullness, tenderness              Rectal exam: {yes no:[id number]}.  Confirms.              Anus:  normal sphincter tone, no lesions  Chaperone was present for exam:  ***  ASSESSMENT     PLAN     An After Visit Summary  was printed and given to the patient.  ______ minutes face to face time of which over 50% was spent in counseling.

## 2022-09-14 NOTE — Telephone Encounter (Signed)
Per Dr. Edward Jolly:  "Please cancel office visit for patient for 09/14/22 at 4:30 pm with me and please inform the patient.  Thank you,  Brook"  Iam forwarding this to you. Not sure if once the appt is cancelled if it needs to be blocked. Just waiting on confirmation from pt. Thanks.

## 2022-09-21 ENCOUNTER — Other Ambulatory Visit: Payer: Self-pay | Admitting: Internal Medicine

## 2022-09-21 DIAGNOSIS — F17210 Nicotine dependence, cigarettes, uncomplicated: Secondary | ICD-10-CM

## 2022-09-30 ENCOUNTER — Telehealth: Payer: BC Managed Care – PPO | Admitting: Family Medicine

## 2022-09-30 DIAGNOSIS — J029 Acute pharyngitis, unspecified: Secondary | ICD-10-CM

## 2022-09-30 MED ORDER — AMOXICILLIN 500 MG PO CAPS: 500.0000 mg | ORAL_CAPSULE | Freq: Two times a day (BID) | ORAL | 0 refills | Status: AC

## 2022-09-30 NOTE — Progress Notes (Signed)

## 2022-10-24 ENCOUNTER — Ambulatory Visit (HOSPITAL_BASED_OUTPATIENT_CLINIC_OR_DEPARTMENT_OTHER)
Admission: RE | Admit: 2022-10-24 | Discharge: 2022-10-24 | Disposition: A | Discharge status: Home / Self Care | Payer: BC Managed Care – PPO | Source: Ambulatory Visit | Attending: Cardiovascular Disease | Admitting: Cardiovascular Disease

## 2022-10-24 DIAGNOSIS — I739 Peripheral vascular disease, unspecified: Secondary | ICD-10-CM | POA: Insufficient documentation

## 2022-10-24 DIAGNOSIS — E785 Hyperlipidemia, unspecified: Secondary | ICD-10-CM | POA: Insufficient documentation

## 2022-10-24 DIAGNOSIS — F17200 Nicotine dependence, unspecified, uncomplicated: Secondary | ICD-10-CM | POA: Insufficient documentation

## 2022-11-18 ENCOUNTER — Encounter: Payer: Self-pay | Admitting: Gastroenterology

## 2022-12-03 ENCOUNTER — Other Ambulatory Visit: Payer: Self-pay | Admitting: Internal Medicine

## 2022-12-12 ENCOUNTER — Encounter: Payer: Self-pay | Admitting: Internal Medicine

## 2022-12-17 ENCOUNTER — Other Ambulatory Visit: Payer: Self-pay | Admitting: Internal Medicine

## 2022-12-17 DIAGNOSIS — E785 Hyperlipidemia, unspecified: Secondary | ICD-10-CM

## 2022-12-20 ENCOUNTER — Telehealth: Payer: Self-pay

## 2022-12-20 DIAGNOSIS — I739 Peripheral vascular disease, unspecified: Secondary | ICD-10-CM

## 2022-12-20 NOTE — Telephone Encounter (Signed)
Caller: Patient  Concern: Pt has shooting pain, bruise on foot (disappeared shortly after noticed), 2nd and 3rd toe numbness, intermittent rest pain.  Pt denies discoloration except at knee, coldness, swelling, wounds  Location: right leg  Description:  pain began approx 6 weeks ago, but pt thought it was related to new exercising routine  Shooting pains started in thigh and traveled to lower leg  Quality: shooting  Consulted: T. Lenell Antu, MD  Resolution: Appointment scheduled for first available [place] and PA on Dr. Verita Lamb office day  Next Appt: Appointment scheduled for 12/21/22 @ 1600 for ABI and 01/03/23 @ 12:30 for PA  Pt placed on wait list for appt with Dr. Lenell Antu

## 2022-12-21 ENCOUNTER — Ambulatory Visit (HOSPITAL_COMMUNITY)
Admission: RE | Admit: 2022-12-21 | Discharge: 2022-12-21 | Disposition: A | Discharge status: Home / Self Care | Payer: BC Managed Care – PPO | Source: Ambulatory Visit | Attending: Vascular Surgery | Admitting: Vascular Surgery

## 2022-12-21 DIAGNOSIS — I739 Peripheral vascular disease, unspecified: Secondary | ICD-10-CM | POA: Diagnosis not present

## 2022-12-21 LAB — VAS US ABI WITH/WO TBI
Left ABI: 1.14
Right ABI: 0.91

## 2022-12-26 ENCOUNTER — Encounter: Payer: Self-pay | Admitting: Internal Medicine

## 2022-12-27 ENCOUNTER — Other Ambulatory Visit: Payer: Self-pay | Admitting: Adult Health

## 2022-12-27 ENCOUNTER — Encounter: Payer: Self-pay | Admitting: Family Medicine

## 2022-12-27 ENCOUNTER — Ambulatory Visit: Payer: BC Managed Care – PPO | Admitting: Family Medicine

## 2022-12-27 ENCOUNTER — Telehealth: Payer: BC Managed Care – PPO | Admitting: Family Medicine

## 2022-12-27 DIAGNOSIS — R519 Headache, unspecified: Secondary | ICD-10-CM

## 2022-12-27 DIAGNOSIS — U071 COVID-19: Secondary | ICD-10-CM

## 2022-12-27 DIAGNOSIS — F411 Generalized anxiety disorder: Secondary | ICD-10-CM

## 2022-12-27 LAB — POC COVID19 BINAXNOW: SARS Coronavirus 2 Ag: NEGATIVE

## 2022-12-27 MED ORDER — NIRMATRELVIR/RITONAVIR (PAXLOVID)TABLET: 3.0000 | ORAL_TABLET | Freq: Two times a day (BID) | ORAL | 0 refills | Status: AC

## 2022-12-27 MED ORDER — ALPRAZOLAM 0.5 MG PO TABS
0.5000 mg | ORAL_TABLET | Freq: Every evening | ORAL | 0 refills | Status: DC | PRN
Start: 2022-12-27 — End: 2023-06-22

## 2022-12-27 MED ORDER — VENLAFAXINE HCL ER 75 MG PO CP24: 75.0000 mg | ORAL_CAPSULE | Freq: Every day | ORAL | 0 refills | Status: DC

## 2022-12-27 NOTE — Progress Notes (Signed)
Subjective:    Patient ID: [name], female    DOB: [birth year], 62 y.o.   MRN: [id number]  HPI Virtual Visit via Video Note  I connected with the patient on 12/27/22 at  2:30 PM EDT by a video enabled telemedicine application and verified that I am speaking with the correct person using two identifiers.  Location patient: home Location provider:work or home office Persons participating in the virtual visit: patient, provider  I discussed the limitations of evaluation and management by telemedicine and the availability of in person appointments. The patient expressed understanding and agreed to proceed.   HPI: Here for 4 days of fevers, aches, ST, and headache. No cough or SOB. Taking Tylenol and Sudafed. She and her husband both tested positive for the Covid virus yesterday.   ROS: See pertinent positives and negatives per HPI.  Past Medical History:  Diagnosis Date   Anxiety    takes Xanax prn    Asthma    Back pain    DDD   Cancer (HCC) 2023   ankle - basal cell carcinoma   Constipation    COVID-19 04/25/2021   flu-like symptoms   Depression    takes Effexor daily   Genital herpes    GERD (gastroesophageal reflux disease)    takes Omeprazole daily   Headache    hx of migraines, sinus headaches   Hemorrhoids    History of abnormal cervical Pap smear    age 66--hx conization-paps normal since   History of bronchitis    last time 3months ago   History of colon polyps    History of hiatal hernia 07/2021   5cm per 08/11/21 EGD   History of migraine    last one 75yrs ago--with aura   HSV-1 infection    Hyperlipidemia    not on meds at present and Dr.McKenzie will review after surgery   Insomnia    Peripheral vascular disease (HCC)    PONV (postoperative nausea and vomiting)    PUD (peptic ulcer disease)    Weakness    bil legs, from pvd, resolved after aortobifemoral bypass in 2014   Wears glasses     Past Surgical History:  Procedure Laterality  Date   AIN I  11/05/2021   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   AIN I   ANKLE SURGERY Left 1986   with 3 screws and 2 pens   AORTA - BILATERAL FEMORAL ARTERY BYPASS GRAFT N/A 01/30/2013   Procedure: AORTA BIFEMORAL BYPASS GRAFT;  Surgeon: Pryor Ochoa, MD;  Location: [facility] OR;  Service: Vascular;  Laterality: N/A;   BREAST SURGERY  02/2020   breast reduction    CARDIOVASCULAR STRESS TEST  01/21/2013   low risk stress test, no ischemia   CERVICAL CONE BIOPSY     age 5   COLONOSCOPY  11/11/2017   polyps, diverticulosis   COLONOSCOPY  08/11/2021   colon polyps, anal papillae (AIN)   CORONARY ANGIOPLASTY  01/2013   Aorta bi-femoral bypass   ECTOPIC PREGNANCY SURGERY     x 2   ESOPHAGOGASTRODUODENOSCOPY  08/11/2021   5 cm hiatal hernia   left fallopian tube removed     25 years ago as of 10/26/21   MASS EXCISION N/A 11/01/2021   Procedure: EXCISION OF ANAL CANAL LESION UNDER ANOSCOPY;  Surgeon: Andria Meuse, MD;  Location: [facility];  Service: General;  Laterality: N/A;   RECTAL EXAM UNDER ANESTHESIA N/A 11/01/2021   Procedure: ANORECTAL EXAM  UNDER ANESTHESIA;  Surgeon: Andria Meuse, MD;  Location: [facility];  Service: General;  Laterality: N/A;   wisdom teeth extracted      when patient was 14 years    Family History  Problem Relation Age of Onset   Hyperlipidemia Mother    Asthma Maternal Grandmother    Heart attack Maternal Grandfather    Heart disease Maternal Grandfather    Hypertension Maternal Grandfather    Hypertension Paternal Grandmother    Breast cancer Other    Colon cancer Neg Hx    Esophageal cancer Neg Hx    Stomach cancer Neg Hx    Rectal cancer Neg Hx      Current Outpatient Medications:    ALPRAZolam (XANAX) 0.5 MG tablet, Take 1 tablet (0.5 mg total) by mouth at bedtime as needed for sleep., Disp: 30 tablet, Rfl: 0   aspirin 81 MG chewable tablet, Chew 1 tablet (81 mg total) by mouth daily., Disp: , Rfl:     Aspirin-Caffeine (BAYER BACK & BODY PO), Take by mouth as needed., Disp: , Rfl:    Nutritional Supplements (BIFIDO-GENIC GROWTH FACTORS PO), Take 4 tablets by mouth daily., Disp: , Rfl:    pantoprazole (PROTONIX) 40 MG tablet, Take 1 tablet (40 mg total) by mouth daily., Disp: 90 tablet, Rfl: 3   rosuvastatin (CRESTOR) 40 MG tablet, Take 1 tablet (40 mg total) by mouth daily., Disp: 90 tablet, Rfl: 3   valACYclovir (VALTREX) 500 MG tablet, TAKE 1 TABLET (500 MG TOTAL) BY MOUTH AS NEEDED., Disp: 30 tablet, Rfl: 5   varenicline (CHANTIX) 1 MG tablet, TAKE 1 TABLET BY MOUTH TWICE A DAY, Disp: 180 tablet, Rfl: 1   Varenicline Tartrate, Starter, 0.5 MG X 11 & 1 MG X 42 TBPK, Take as directed, Disp: 1 each, Rfl: 0   venlafaxine XR (EFFEXOR-XR) 75 MG 24 hr capsule, Take 1 capsule (75 mg total) by mouth daily with breakfast., Disp: 90 capsule, Rfl: 0  EXAM:  VITALS per patient if applicable:  GENERAL: alert, oriented, appears well and in no acute distress  HEENT: atraumatic, conjunttiva clear, no obvious abnormalities on inspection of external nose and ears  NECK: normal movements of the head and neck  LUNGS: on inspection no signs of respiratory distress, breathing rate appears normal, no obvious gross SOB, gasping or wheezing  CV: no obvious cyanosis  MS: moves all visible extremities without noticeable abnormality  PSYCH/NEURO: pleasant and cooperative, no obvious depression or anxiety, speech and thought processing grossly intact  ASSESSMENT AND PLAN: Covid infection, treat with 5 days of Paxlovid. Gershon Crane, MD  Discussed the following assessment and plan:  Nonintractable headache, unspecified chronicity pattern, unspecified headache type - Plan: POC COVID-19     I discussed the assessment and treatment plan with the patient. The patient was provided an opportunity to ask questions and all were answered. The patient agreed with the plan and demonstrated an understanding of the  instructions.   The patient was advised to call back or seek an in-person evaluation if the symptoms worsen or if the condition fails to improve as anticipated.      Review of Systems     Objective:   Physical Exam        Assessment & Plan:

## 2023-01-03 ENCOUNTER — Ambulatory Visit: Payer: BC Managed Care – PPO | Admitting: Physician Assistant

## 2023-01-03 VITALS — BP 180/72 | HR 88 | Temp 98.4°F | Resp 18 | Ht 67.0 in | Wt 163.2 lb

## 2023-01-03 DIAGNOSIS — I739 Peripheral vascular disease, unspecified: Secondary | ICD-10-CM

## 2023-01-03 NOTE — Progress Notes (Signed)
[facility] [place] HISTORY AND PHYSICAL   History of Present Illness:  Patient is a 62 y.o. year old female who presents for evaluation of pad.  She states she has a 4 week history of shooting pain from her right hip area to her knee for 2 weeks then she had shooting pain from her knee to the ankle, then hip to foot.  This shooting pain has gone away.  She most recently developed numbness in her 2-4 toes on the right foot only in the tips and worked very hard to touch them and wiggle them to wake them up.    She has a history of  aortobifemoral bypass in 2014.   Her chief complaints where Pain in bilateral thighs (left > right) as well as some cramping in the right calf with walking and toes on the left have some numbness at times.  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    Past Medical History:  Diagnosis Date   Anxiety    takes Xanax prn    Asthma    Back pain    DDD   Cancer (HCC) 2023   ankle - basal cell carcinoma   Constipation    COVID-19 04/25/2021   flu-like symptoms   Depression    takes Effexor daily   Genital herpes    GERD (gastroesophageal reflux disease)    takes Omeprazole daily   Headache    hx of migraines, sinus headaches   Hemorrhoids    History of abnormal cervical Pap smear    age 5--hx conization-paps normal since   History of bronchitis    last time 3months ago   History of colon polyps    History of hiatal hernia 07/2021   5cm per 08/11/21 EGD   History of migraine    last one 72yrs ago--with aura   HSV-1 infection    Hyperlipidemia    not on meds at present and Dr.McKenzie will review after surgery   Insomnia    Peripheral vascular disease (HCC)    PONV (postoperative nausea and vomiting)    PUD (peptic ulcer disease)    Weakness    bil legs, from pvd,  resolved after aortobifemoral bypass in 2014   Wears glasses     Past Surgical History:  Procedure Laterality Date   AIN I  11/05/2021   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   AIN I   ANKLE SURGERY Left 1986   with 3 screws and 2 pens   AORTA - BILATERAL FEMORAL ARTERY BYPASS GRAFT N/A 01/30/2013   Procedure: AORTA BIFEMORAL BYPASS GRAFT;  Surgeon: Pryor Ochoa, MD;  Location: [facility] OR;  Service: Vascular;  Laterality: N/A;   BREAST SURGERY  02/2020   breast reduction    CARDIOVASCULAR STRESS TEST  01/21/2013   low risk stress test, no ischemia   CERVICAL CONE BIOPSY     age 43   COLONOSCOPY  11/11/2017   polyps, diverticulosis   COLONOSCOPY  08/11/2021   colon polyps, anal papillae (AIN)   CORONARY ANGIOPLASTY  01/2013   Aorta bi-femoral bypass   ECTOPIC PREGNANCY SURGERY     x 2   ESOPHAGOGASTRODUODENOSCOPY  08/11/2021   5 cm hiatal hernia   left fallopian tube removed     25 years ago as of 10/26/21   MASS  EXCISION N/A 11/01/2021   Procedure: EXCISION OF ANAL CANAL LESION UNDER ANOSCOPY;  Surgeon: Andria Meuse, MD;  Location: [facility];  Service: General;  Laterality: N/A;   RECTAL EXAM UNDER ANESTHESIA N/A 11/01/2021   Procedure: ANORECTAL EXAM UNDER ANESTHESIA;  Surgeon: Andria Meuse, MD;  Location: [facility];  Service: General;  Laterality: N/A;   wisdom teeth extracted      when patient was 14 years    ROS:   General:  No weight loss, Fever, chills  HEENT: No recent headaches, no nasal bleeding, no visual changes, no sore throat  Neurologic: No dizziness, blackouts, seizures. No recent symptoms of stroke or mini- stroke. No recent episodes of slurred speech, or temporary blindness.  Cardiac: No recent episodes of chest pain/pressure, no shortness of breath at rest.  No shortness of breath with exertion.  Denies history of atrial fibrillation or irregular heartbeat  Vascular: No history of rest pain in feet.  No history of  claudication.  No history of non-healing ulcer, No history of DVT   Pulmonary: No home oxygen, no productive cough, no hemoptysis,  No asthma or wheezing  Musculoskeletal:  [ ]  Arthritis, [ ]  Low back pain,  [ ]  Joint pain  Hematologic:No history of hypercoagulable state.  No history of easy bleeding.  No history of anemia  Gastrointestinal: No hematochezia or melena,  No gastroesophageal reflux, no trouble swallowing  Urinary: [ ]  chronic Kidney disease, [ ]  on HD - [ ]  MWF or [ ]  TTHS, [ ]  Burning with urination, [ ]  Frequent urination, [ ]  Difficulty urinating;   Skin: No rashes  Psychological: No history of anxiety,  No history of depression  Social History Social History   Tobacco Use   Smoking status: Every Day    Current packs/day: 0.50    Average packs/day: 0.5 packs/day for 20.0 years (10.0 ttl pk-yrs)    Types: Cigarettes   Smokeless tobacco: Never   Tobacco comments:    Recently filled Chantix - Awaiting source of radiating leg pain origin  Vaping Use   Vaping status: Never Used  Substance Use Topics   Alcohol use: Yes    Alcohol/week: 2.0 standard drinks of alcohol    Types: 2 Glasses of wine per week    Comment: weekends only   Drug use: No    Family History Family History  Problem Relation Age of Onset   Hyperlipidemia Mother    Asthma Maternal Grandmother    Heart attack Maternal Grandfather    Heart disease Maternal Grandfather    Hypertension Maternal Grandfather    Hypertension Paternal Grandmother    Breast cancer Other    Colon cancer Neg Hx    Esophageal cancer Neg Hx    Stomach cancer Neg Hx    Rectal cancer Neg Hx     Allergies  Allergies  Allergen Reactions   Lipitor [Atorvastatin]     Muscle aches   Septra [Sulfamethoxazole-Trimethoprim] Other (See Comments)    Unknown    Simvastatin     Muscle cramps      Current Outpatient Medications  Medication Sig Dispense Refill   ALPRAZolam (XANAX) 0.5 MG tablet Take 1 tablet (0.5 mg  total) by mouth at bedtime as needed for sleep. 30 tablet 0   aspirin 81 MG chewable tablet Chew 1 tablet (81 mg total) by mouth daily.     Aspirin-Caffeine (BAYER BACK & BODY PO) Take by mouth as needed.  Nutritional Supplements (BIFIDO-GENIC GROWTH FACTORS PO) Take 4 tablets by mouth daily.     pantoprazole (PROTONIX) 40 MG tablet Take 1 tablet (40 mg total) by mouth daily. 90 tablet 3   rosuvastatin (CRESTOR) 40 MG tablet Take 1 tablet (40 mg total) by mouth daily. 90 tablet 3   valACYclovir (VALTREX) 500 MG tablet TAKE 1 TABLET (500 MG TOTAL) BY MOUTH AS NEEDED. 30 tablet 5   varenicline (CHANTIX) 1 MG tablet TAKE 1 TABLET BY MOUTH TWICE A DAY 180 tablet 1   Varenicline Tartrate, Starter, 0.5 MG X 11 & 1 MG X 42 TBPK Take as directed 1 each 0   venlafaxine XR (EFFEXOR-XR) 75 MG 24 hr capsule Take 1 capsule (75 mg total) by mouth daily with breakfast. 90 capsule 0   No current facility-administered medications for this visit.    Physical Examination  Vitals:   01/03/23 1228  BP: (!) 180/72  Pulse: 88  Resp: 18  Temp: 98.4 F (36.9 C)  TempSrc: Temporal  SpO2: 98%  Weight: 163 lb 3.2 oz (74 kg)  Height: 5\' 7"  (1.702 m)    Body mass index is 25.56 kg/m.  General:  Alert and oriented, no acute distress HEENT: Normal Neck: No bruit or JVD Pulmonary: Clear to auscultation bilaterally Cardiac: Regular Rate and Rhythm without murmur Abdomen: Soft, non-tender, non-distended, no mass, no scars Skin: No rash, no ischemic skin changes Extremity Pulses:  palpable left DP with biphasic dorsalis pedis, posterior tibial doppler signals on the right Musculoskeletal: No deformity or edema  Neurologic: Upper and lower extremity motor 5/5 and symmetric  DATA:  ABI Findings:  +---------+------------------+-----+----------+--------+  Right   Rt Pressure (mmHg)IndexWaveform  Comment   +---------+------------------+-----+----------+--------+  Brachial 139                                         +---------+------------------+-----+----------+--------+  PTA     127               0.91 monophasic          +---------+------------------+-----+----------+--------+  DP      113               0.81 monophasic          +---------+------------------+-----+----------+--------+  Great Toe94                0.68 Abnormal            +---------+------------------+-----+----------+--------+   +---------+------------------+-----+-----------+-------+  Left    Lt Pressure (mmHg)IndexWaveform   Comment  +---------+------------------+-----+-----------+-------+  Brachial 139                                        +---------+------------------+-----+-----------+-------+  PTA     149               1.07 multiphasic         +---------+------------------+-----+-----------+-------+  DP      159               1.14 multiphasic         +---------+------------------+-----+-----------+-------+  Great Toe128               0.92 Abnormal            +---------+------------------+-----+-----------+-------+   +-------+-----------+-----------+------------+------------+  ABI/TBIToday's ABIToday's TBIPrevious ABIPrevious  TBI  +-------+-----------+-----------+------------+------------+  Right 0.91       0.68       0.82        0.66          +-------+-----------+-----------+------------+------------+  Left  1.14       0.92       1.07        0.87          +-------+-----------+-----------+------------+------------+        Arterial wall calcification precludes accurate ankle pressures and ABIs.  PPG tracings display appropriate pulsatility.  Right ABIs appear increased compared to prior study on 11/17/20. Right TBIs  appear essentially unchanged compared to prior study on 11/17/20. Right ABI  is increased but still within the mild range (0.80-0.94) with monophasic  waveforms at the ankle level.  Left ABI and TBI are essentially unchanged  compared to pior study on  11/17/20.    Summary:  Right: Resting right ankle-brachial index indicates mild right lower  extremity arterial disease. The right toe-brachial index is abnormal.   Left: Resting left ankle-brachial index is within normal range. The left  toe-brachial index is normal.    ASSESSMENT/PLAN:  PAD history of  aortobifemoral bypass in 2014.   Her chief complaints where Pain in bilateral thighs (left > right) as well as some cramping in the right calf with walking and toes on the left have some numbness at times.  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  She denies claudication, rest pain or non healing wounds.  She had a 4 week history of shooting pains in the right lE that went away followed by toe numbness.  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  Her doppler signals are biphasic on exam.    Her ABI's are reported as monophasic on the study today.  I will bring her back for repeat right LE arterial duplex with aortoiliac fasting duplex.  She agrees with this plan.  She requested to f/u with Dr. Lenell Antu.     Mosetta Pigeon PA-C [facility] [place] Office: [phone]  MD in clinic [place]

## 2023-01-14 ENCOUNTER — Other Ambulatory Visit: Payer: Self-pay | Admitting: Internal Medicine

## 2023-01-14 DIAGNOSIS — E785 Hyperlipidemia, unspecified: Secondary | ICD-10-CM

## 2023-01-23 ENCOUNTER — Other Ambulatory Visit: Payer: Self-pay

## 2023-01-23 DIAGNOSIS — I739 Peripheral vascular disease, unspecified: Secondary | ICD-10-CM

## 2023-01-31 ENCOUNTER — Ambulatory Visit (HOSPITAL_COMMUNITY): Admission: RE | Admit: 2023-01-31 | Payer: BC Managed Care – PPO | Source: Ambulatory Visit

## 2023-02-03 ENCOUNTER — Ambulatory Visit (INDEPENDENT_AMBULATORY_CARE_PROVIDER_SITE_OTHER)
Admission: RE | Admit: 2023-02-03 | Discharge: 2023-02-03 | Disposition: A | Discharge status: Home / Self Care | Payer: BC Managed Care – PPO | Source: Ambulatory Visit | Attending: Vascular Surgery

## 2023-02-03 ENCOUNTER — Ambulatory Visit (HOSPITAL_COMMUNITY)
Admission: RE | Admit: 2023-02-03 | Discharge: 2023-02-03 | Disposition: A | Discharge status: Home / Self Care | Payer: BC Managed Care – PPO | Source: Ambulatory Visit | Attending: Vascular Surgery | Admitting: Vascular Surgery

## 2023-02-03 DIAGNOSIS — I739 Peripheral vascular disease, unspecified: Secondary | ICD-10-CM | POA: Diagnosis present

## 2023-02-03 LAB — HEPATIC FUNCTION PANEL
ALT: 25 IU/L (ref 0–32)
AST: 23 IU/L (ref 0–40)
Albumin: 4.7 g/dL (ref 3.9–4.9)
Alkaline Phosphatase: 87 IU/L (ref 44–121)
Bilirubin Total: 0.4 mg/dL (ref 0.0–1.2)
Bilirubin, Direct: 0.13 mg/dL (ref 0.00–0.40)
Total Protein: 7.1 g/dL (ref 6.0–8.5)

## 2023-02-03 LAB — LIPID PANEL
Chol/HDL Ratio: 3.8 ratio (ref 0.0–4.4)
Cholesterol, Total: 237 mg/dL — ABNORMAL HIGH (ref 100–199)
HDL: 63 mg/dL
LDL Chol Calc (NIH): 136 mg/dL — ABNORMAL HIGH (ref 0–99)
Triglycerides: 215 mg/dL — ABNORMAL HIGH (ref 0–149)
VLDL Cholesterol Cal: 38 mg/dL (ref 5–40)

## 2023-02-03 LAB — HEMOGLOBIN A1C: Hemoglobin A1C: 5.9

## 2023-02-06 ENCOUNTER — Other Ambulatory Visit: Payer: Self-pay | Admitting: *Deleted

## 2023-02-06 DIAGNOSIS — E785 Hyperlipidemia, unspecified: Secondary | ICD-10-CM

## 2023-02-17 ENCOUNTER — Telehealth: Payer: Self-pay | Admitting: Pharmacist

## 2023-02-17 ENCOUNTER — Encounter: Payer: Self-pay | Admitting: Pharmacist

## 2023-02-17 ENCOUNTER — Telehealth: Payer: Self-pay | Admitting: Pharmacy Technician

## 2023-02-17 ENCOUNTER — Ambulatory Visit: Payer: BC Managed Care – PPO | Attending: Cardiology | Admitting: Pharmacist

## 2023-02-17 ENCOUNTER — Other Ambulatory Visit (HOSPITAL_COMMUNITY): Payer: Self-pay

## 2023-02-17 DIAGNOSIS — I70219 Atherosclerosis of native arteries of extremities with intermittent claudication, unspecified extremity: Secondary | ICD-10-CM

## 2023-02-17 DIAGNOSIS — E785 Hyperlipidemia, unspecified: Secondary | ICD-10-CM | POA: Diagnosis not present

## 2023-02-17 DIAGNOSIS — I739 Peripheral vascular disease, unspecified: Secondary | ICD-10-CM

## 2023-02-17 DIAGNOSIS — M791 Myalgia, unspecified site: Secondary | ICD-10-CM | POA: Insufficient documentation

## 2023-02-17 NOTE — Telephone Encounter (Signed)
Pharmacy Patient Advocate Encounter   Received notification from Pt Calls Messages that prior authorization for repatha is required/requested.   Insurance verification completed.   The patient is insured through [facility] .   Per test claim: PA required; PA submitted to [facility] via CoverMyMeds Key/confirmation #/EOC [id number] Status is pending
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
## 2023-02-17 NOTE — Telephone Encounter (Signed)
Pharmacy Patient Advocate Encounter  Received notification from [facility] that Prior Authorization for repatha has been APPROVED from 02/17/23 to 02/16/24. Ran test claim, Copay is $30.00- One month supply. This test claim was processed through [facility]- copay amounts may vary at other pharmacies due to pharmacy/plan contracts, or as the patient moves through the different stages of their insurance plan.   PA #/Case ID/Reference #: [id number]

## 2023-02-17 NOTE — Patient Instructions (Addendum)
It was nice meeting you today  We would like your LDL (bad cholesterol) to be less than 55  Please continue your rosuvastatin 20mg  once a day  The medication we discussed today is called Repatha, which is an injection you would take once every 2 weeks  I will complete the prior authorization for you and contact you when it is approved  Once you start the injections, we would recheck your cholesterol panel in 2-3 months  Please contact us if you have any questions  Laural Golden, PharmD, BCACP, CDCES, CPP [address], [address] [place], [place], [address] Phone: [phone], Fax: [phone]

## 2023-02-17 NOTE — Telephone Encounter (Signed)
Please complete PA for Repatha 140. Diagnosis: I73.9, I70.219, E78.5

## 2023-02-20 NOTE — Progress Notes (Unsigned)
[facility] OF [place]  ASSESSMENT / PLAN: [name] is a 62 y.o. female with peripheral arterial disease status post aortobifemoral bypass in 2014.   Patient counseled patients with asymptomatic peripheral arterial disease or claudication have a 1-2% risk of developing chronic limb threatening ischemia, but a 15-30% risk of mortality in the next 5 years. Intervention should only be considered for medically optimized patients with disabling symptoms.   Recommend the following which can slow the progression of atherosclerosis and reduce the risk of major adverse cardiac / limb events:  Complete cessation from all tobacco products. Blood glucose control with goal A1c < 7%. Blood pressure control with goal blood pressure < 140/90 mmHg. Lipid reduction therapy with goal LDL-C <100 mg/dL (<95 if symptomatic from PAD).  Aspirin 81mg  PO QD.  Atorvastatin 40-80mg  PO QD (or other "high intensity" statin therapy).  Patient has very mild claudication symptoms. I counseled her to avoid intervention for as long as possible and to avoid intervention if she is smoking. Follow up with me in a year with ABI.  CHIEF COMPLAINT: Follow-up aortobifemoral bypass  HISTORY OF PRESENT ILLNESS: [name] is a 62 y.o. female presents to clinic for evaluation at the recommendation of her primary care doctor.  The patient underwent an aortobifemoral bypass graft 01/30/2013 with Dr. Hart Rochester.  She did very well postoperatively.  She took his recommendations to heart and has been taking excellent care of herself.  Walks 6 to 8 miles a day.  She is trying to quit smoking but is finding it hard to do so.  She is compliant with aspirin and statin therapies.  Has noticed no hernia or discomfort in her incisional sites.  11/18/20: Patient returns to clinic reporting right leg discomfort.  The pain radiates from her buttock down her leg.  Pain does not seem to be positional.  She has no  exertional discomfort.  She does not have symptoms typical of intermittent claudication (no cramping when ambulating).  She is still walking 5 to 6 miles a day.  She still smokes, unfortunately.  No pain at rest.  No ulceration.  02/21/23: Patient returns to clinic for follow-up.  The patient reports extremely long distance numbness and cramping discomfort in her right leg.  She is a daily hiker.  She reports this happened after miles of walking.  I reviewed the technical challenges with revascularization for person with long distance claudication.  The patient is also actively smoking.  I explained how this would threaten the longevity of any intervention.  Ultimately she is understanding about continuing conservative therapy.  VASCULAR SURGICAL HISTORY:  Aortobifemoral bypass graft 01/30/13 with Dr. Hart Rochester  Past Medical History:  Diagnosis Date   Anxiety    takes Xanax prn    Asthma    Back pain    DDD   Cancer (HCC) 2023   ankle - basal cell carcinoma   Constipation    COVID-19 04/25/2021   flu-like symptoms   Depression    takes Effexor daily   Genital herpes    GERD (gastroesophageal reflux disease)    takes Omeprazole daily   Headache    hx of migraines, sinus headaches   Hemorrhoids    History of abnormal cervical Pap smear    age 39--hx conization-paps normal since   History of bronchitis    last time 3months ago   History of colon polyps    History of hiatal hernia 07/2021   5cm per 08/11/21 EGD  History of migraine    last one 85yrs ago--with aura   HSV-1 infection    Hyperlipidemia    not on meds at present and Dr.McKenzie will review after surgery   Insomnia    Peripheral vascular disease (HCC)    PONV (postoperative nausea and vomiting)    PUD (peptic ulcer disease)    Weakness    bil legs, from pvd, resolved after aortobifemoral bypass in 2014   Wears glasses     Past Surgical History:  Procedure Laterality Date   AIN I  11/05/2021   Anal dysplasia   10/2021   AIN I   ANKLE SURGERY Left 1986   with 3 screws and 2 pens   AORTA - BILATERAL FEMORAL ARTERY BYPASS GRAFT N/A 01/30/2013   Procedure: AORTA BIFEMORAL BYPASS GRAFT;  Surgeon: Pryor Ochoa, MD;  Location: [facility];  Service: Vascular;  Laterality: N/A;   BREAST SURGERY  02/2020   breast reduction    CARDIOVASCULAR STRESS TEST  01/21/2013   low risk stress test, no ischemia   CERVICAL CONE BIOPSY     age 57   COLONOSCOPY  11/11/2017   polyps, diverticulosis   COLONOSCOPY  08/11/2021   colon polyps, anal papillae (AIN)   CORONARY ANGIOPLASTY  01/2013   Aorta bi-femoral bypass   ECTOPIC PREGNANCY SURGERY     x 2   ESOPHAGOGASTRODUODENOSCOPY  08/11/2021   5 cm hiatal hernia   left fallopian tube removed     25 years ago as of 10/26/21   MASS EXCISION N/A 11/01/2021   Procedure: EXCISION OF ANAL CANAL LESION UNDER ANOSCOPY;  Surgeon: Andria Meuse, MD;  Location: [facility];  Service: General;  Laterality: N/A;   RECTAL EXAM UNDER ANESTHESIA N/A 11/01/2021   Procedure: ANORECTAL EXAM UNDER ANESTHESIA;  Surgeon: Andria Meuse, MD;  Location: [facility];  Service: General;  Laterality: N/A;   wisdom teeth extracted      when patient was 14 years    Family History  Problem Relation Age of Onset   Hyperlipidemia Mother    Asthma Maternal Grandmother    Heart attack Maternal Grandfather    Heart disease Maternal Grandfather    Hypertension Maternal Grandfather    Hypertension Paternal Grandmother    Breast cancer Other    Colon cancer Neg Hx    Esophageal cancer Neg Hx    Stomach cancer Neg Hx    Rectal cancer Neg Hx     Social History   Socioeconomic History   Marital status: Married    Spouse name: Not on file   Number of children: 1   Years of education: Not on file   Highest education level: Bachelor's degree (e.g., BA, AB, BS)  Occupational History    Employer: [employer]  Tobacco Use   Smoking  status: Every Day    Current packs/day: 0.50    Average packs/day: 0.5 packs/day for 20.0 years (10.0 ttl pk-yrs)    Types: Cigarettes   Smokeless tobacco: Never   Tobacco comments:    Recently filled Chantix - Awaiting source of radiating leg pain origin  Vaping Use   Vaping status: Never Used  Substance and Sexual Activity   Alcohol use: Yes    Alcohol/week: 2.0 standard drinks of alcohol    Types: 2 Glasses of wine per week    Comment: weekends only   Drug use: No   Sexual activity: Yes    Birth control/protection: Post-menopausal  Other Topics Concern   Not on file  Social History Narrative   Lives at home with husband.     Social Determinants of Health   Financial Resource Strain: Low Risk  (08/18/2022)   Overall Financial Resource Strain (CARDIA)    Difficulty of Paying Living Expenses: Not hard at all  Food Insecurity: No Food Insecurity (08/18/2022)   Hunger Vital Sign    Worried About Running Out of Food in the Last Year: Never true    Ran Out of Food in the Last Year: Never true  Transportation Needs: No Transportation Needs (08/18/2022)   PRAPARE - Administrator, Civil Service (Medical): No    Lack of Transportation (Non-Medical): No  Physical Activity: Sufficiently Active (08/18/2022)   Exercise Vital Sign    Days of Exercise per Week: 5 days    Minutes of Exercise per Session: 60 min  Stress: Stress Concern Present (08/18/2022)   [employer] of Occupational Health - Occupational Stress Questionnaire    Feeling of Stress : To some extent  Social Connections: Unknown (08/18/2022)   Social Connection and Isolation Panel [NHANES]    Frequency of Communication with Friends and Family: More than three times a week    Frequency of Social Gatherings with Friends and Family: Once a week    Attends Religious Services: More than 4 times per year    Active Member of [employer] or Organizations: Patient declined    Attends Engineer, structural: Not on file     Marital Status: Married  Catering manager Violence: Not on file    Allergies  Allergen Reactions   Lipitor [Atorvastatin]     Muscle aches   Septra [Sulfamethoxazole-Trimethoprim] Other (See Comments)    Unknown    Simvastatin     Muscle cramps     Current Outpatient Medications  Medication Sig Dispense Refill   ALPRAZolam (XANAX) 0.5 MG tablet Take 1 tablet (0.5 mg total) by mouth at bedtime as needed for sleep. 30 tablet 0   aspirin 81 MG chewable tablet Chew 1 tablet (81 mg total) by mouth daily.     Aspirin-Caffeine (BAYER BACK & BODY PO) Take by mouth as needed.     Nutritional Supplements (BIFIDO-GENIC GROWTH FACTORS PO) Take 4 tablets by mouth daily.     pantoprazole (PROTONIX) 40 MG tablet Take 1 tablet (40 mg total) by mouth daily. 90 tablet 3   rosuvastatin (CRESTOR) 20 MG tablet TAKE 1 TABLET BY MOUTH EVERY DAY 90 tablet 0   valACYclovir (VALTREX) 500 MG tablet TAKE 1 TABLET (500 MG TOTAL) BY MOUTH AS NEEDED. 30 tablet 5   venlafaxine XR (EFFEXOR-XR) 75 MG 24 hr capsule Take 1 capsule (75 mg total) by mouth daily with breakfast. 90 capsule 0   No current facility-administered medications for this visit.    PHYSICAL EXAM  Vitals:   02/21/23 1036  BP: (!) 134/90  Pulse: 78  Temp: 98.6 F (37 C)  SpO2: 96%  Weight: 164 lb (74.4 kg)  Height: 5\' 7"  (1.702 m)      Constitutional: well appearing in no distress. Appears well nourished.  Neurologic: CN intact. no focal findings. no sensory loss. Psychiatric: Mood and affect symmetric and appropriate. Eyes: No icterus. No conjunctival pallor. Ears, nose, throat: mucous membranes moist. Midline trachea.  Cardiac: regular rate and rhythm.  Respiratory: unlabored. Abdominal: soft, non-tender, non-distended. No midline hernia. Well healed incision. Peripheral vascular:  2+ femoral pulse bilaterally  2+ popliteal pulse bilaterally  1+ Dps bilaterally Extremity: No edema. No cyanosis. No pallor.  Skin: No gangrene.  No ulceration.  Lymphatic: No Stemmer's sign. No palpable lymphadenopathy.   PERTINENT LABORATORY AND RADIOLOGIC DATA  Most recent CBC    Latest Ref Rng & Units 04/26/2022    2:06 PM 08/06/2021    9:24 AM 07/19/2021    9:01 AM  CBC  WBC 4.0 - 10.5 K/uL 7.7  5.5  7.1   Hemoglobin 12.0 - 15.0 g/dL 78.2  95.6  21.3   Hematocrit 36.0 - 46.0 % 46.1  42.1  44.6   Platelets 150.0 - 400.0 K/uL 235.0  198.0  240      Most recent CMP    Latest Ref Rng & Units 02/03/2023    9:12 AM 04/26/2022    2:06 PM 08/06/2021    9:24 AM  CMP  Glucose 70 - 99 mg/dL  90  92   BUN 6 - 23 mg/dL  17  17   Creatinine 0.86 - 1.20 mg/dL  5.78  4.69   Sodium 629 - 145 mEq/L  139  140   Potassium 3.5 - 5.1 mEq/L  4.7  4.4   Chloride 96 - 112 mEq/L  103  104   CO2 19 - 32 mEq/L  27  30   Calcium 8.4 - 10.5 mg/dL  52.8  9.3   Total Protein 6.0 - 8.5 g/dL 7.1  7.6  6.7   Total Bilirubin 0.0 - 1.2 mg/dL 0.4  0.4  0.3   Alkaline Phos 44 - 121 IU/L 87  84  83   AST 0 - 40 IU/L 23  22  26    ALT 0 - 32 IU/L 25  22  26      Renal function CrCl cannot be calculated (Patient's most recent lab result is older than the maximum 21 days allowed.).  Hgb A1c MFr Bld (%)  Date Value  04/26/2022 6.3    LDL Chol Calc (NIH)  Date Value Ref Range Status  02/03/2023 136 (H) 0 - 99 mg/dL Final   Direct LDL  Date Value Ref Range Status  04/26/2022 128.0 mg/dL Final    Comment:    Optimal:  <100 mg/dLNear or Above Optimal:  100-129 mg/dLBorderline High:  130-159 mg/dLHigh:  160-189 mg/dLVery High:  >190 mg/dL      +-------+-----------+-----------+------------+------------+  ABI/TBIToday's ABIToday's TBIPrevious ABIPrevious TBI  +-------+-----------+-----------+------------+------------+  Right 0.91       0.68       0.82        0.66          +-------+-----------+-----------+------------+------------+  Left  1.14       0.92       1.07        0.87           +-------+-----------+-----------+------------+------------+    Aortoiliac duplex:  Widely patent aortobifemoral bypass graft.    Right lower extremity arterial duplex : Patent superficial femoral artery.  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         Rande Brunt. Lenell Antu, MD [facility] Phone Number: [phone] 02/21/2023 3:14 PM

## 2023-02-21 ENCOUNTER — Ambulatory Visit: Payer: BC Managed Care – PPO | Admitting: Vascular Surgery

## 2023-02-21 ENCOUNTER — Encounter: Payer: Self-pay | Admitting: Vascular Surgery

## 2023-02-21 ENCOUNTER — Encounter (HOSPITAL_COMMUNITY): Payer: BC Managed Care – PPO

## 2023-02-21 VITALS — BP 134/90 | HR 78 | Temp 98.6°F | Ht 67.0 in | Wt 164.0 lb

## 2023-02-21 DIAGNOSIS — I739 Peripheral vascular disease, unspecified: Secondary | ICD-10-CM | POA: Diagnosis not present

## 2023-02-22 ENCOUNTER — Other Ambulatory Visit (HOSPITAL_COMMUNITY): Payer: Self-pay

## 2023-02-22 MED ORDER — REPATHA SURECLICK 140 MG/ML ~~LOC~~ SOAJ
1.0000 mL | SUBCUTANEOUS | 1 refills | Status: DC
Start: 2023-02-22 — End: 2023-11-23
  Filled 2023-02-22: qty 6, 84d supply, fill #0

## 2023-02-22 NOTE — Addendum Note (Signed)
Addended by: Cheree Ditto on: 02/22/2023 08:18 AM   Modules accepted: Orders

## 2023-02-24 ENCOUNTER — Other Ambulatory Visit: Payer: Self-pay | Admitting: Gastroenterology

## 2023-02-24 DIAGNOSIS — K219 Gastro-esophageal reflux disease without esophagitis: Secondary | ICD-10-CM

## 2023-02-28 ENCOUNTER — Other Ambulatory Visit (HOSPITAL_COMMUNITY): Payer: Self-pay

## 2023-03-13 ENCOUNTER — Ambulatory Visit: Payer: BC Managed Care – PPO | Admitting: Internal Medicine

## 2023-03-13 VITALS — BP 120/80 | HR 79 | Temp 97.3°F | Wt 163.3 lb

## 2023-03-13 DIAGNOSIS — E782 Mixed hyperlipidemia: Secondary | ICD-10-CM

## 2023-03-13 DIAGNOSIS — R7302 Impaired glucose tolerance (oral): Secondary | ICD-10-CM | POA: Diagnosis not present

## 2023-03-13 NOTE — Progress Notes (Signed)
Established Patient Office Visit     CC/Reason for Visit: Discuss recent labs  HPI: [name] is a 62 y.o. female who is coming in today for the above mentioned reasons. Past Medical History is significant for: Hyperlipidemia, ongoing smoking use, known aortic atherosclerosis, GERD, anxiety.  In July she believes she contacted listeria after consuming contaminated [employer] products.  She had GI symptoms.  These resolved however she continued to feel "off".  This prompted her to see a functional medicine specialist who ordered labs.  She is here to review those today.  The only abnormalities on this lab work is an elevated A1c of 5.9 and hyperlipidemia for which she is already on rosuvastatin and soon to start Repatha.  She is feeling improved.   Past Medical/Surgical History: Past Medical History:  Diagnosis Date   Anxiety    takes Xanax prn    Asthma    Back pain    DDD   Cancer (HCC) 2023   ankle - basal cell carcinoma   Constipation    COVID-19 04/25/2021   flu-like symptoms   Depression    takes Effexor daily   Genital herpes    GERD (gastroesophageal reflux disease)    takes Omeprazole daily   Headache    hx of migraines, sinus headaches   Hemorrhoids    History of abnormal cervical Pap smear    age 1--hx conization-paps normal since   History of bronchitis    last time 3months ago   History of colon polyps    History of hiatal hernia 07/2021   5cm per 08/11/21 EGD   History of migraine    last one 9yrs ago--with aura   HSV-1 infection    Hyperlipidemia    not on meds at present and Dr.McKenzie will review after surgery   Insomnia    Peripheral vascular disease (HCC)    PONV (postoperative nausea and vomiting)    PUD (peptic ulcer disease)    Weakness    bil legs, from pvd, resolved after aortobifemoral bypass in 2014   Wears glasses     Past Surgical History:  Procedure Laterality Date   AIN I  11/05/2021   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    AIN I   ANKLE SURGERY Left 1986   with 3 screws and 2 pens   AORTA - BILATERAL FEMORAL ARTERY BYPASS GRAFT N/A 01/30/2013   Procedure: AORTA BIFEMORAL BYPASS GRAFT;  Surgeon: Pryor Ochoa, MD;  Location: [facility] OR;  Service: Vascular;  Laterality: N/A;   BREAST SURGERY  02/2020   breast reduction    CARDIOVASCULAR STRESS TEST  01/21/2013   low risk stress test, no ischemia   CERVICAL CONE BIOPSY     age 39   COLONOSCOPY  11/11/2017   polyps, diverticulosis   COLONOSCOPY  08/11/2021   colon polyps, anal papillae (AIN)   CORONARY ANGIOPLASTY  01/2013   Aorta bi-femoral bypass   ECTOPIC PREGNANCY SURGERY     x 2   ESOPHAGOGASTRODUODENOSCOPY  08/11/2021   5 cm hiatal hernia   left fallopian tube removed     25 years ago as of 10/26/21   MASS EXCISION N/A 11/01/2021   Procedure: EXCISION OF ANAL CANAL LESION UNDER ANOSCOPY;  Surgeon: Andria Meuse, MD;  Location: [facility];  Service: General;  Laterality: N/A;   RECTAL EXAM UNDER ANESTHESIA N/A 11/01/2021   Procedure: ANORECTAL EXAM UNDER ANESTHESIA;  Surgeon: Andria Meuse, MD;  Location: [facility];  Service: General;  Laterality: N/A;   wisdom teeth extracted      when patient was 14 years    Social History:  reports that she has been smoking cigarettes. She has a 10 pack-year smoking history. She has never used smokeless tobacco. She reports current alcohol use of about 2.0 standard drinks of alcohol per week. She reports that she does not use drugs.  Allergies: Allergies  Allergen Reactions   Lipitor [Atorvastatin]     Muscle aches   Septra [Sulfamethoxazole-Trimethoprim] Other (See Comments)    Unknown    Simvastatin     Muscle cramps     Family History:  Family History  Problem Relation Age of Onset   Hyperlipidemia Mother    Asthma Maternal Grandmother    Heart attack Maternal Grandfather    Heart disease Maternal Grandfather    Hypertension Maternal Grandfather     Hypertension Paternal Grandmother    Breast cancer Other    Colon cancer Neg Hx    Esophageal cancer Neg Hx    Stomach cancer Neg Hx    Rectal cancer Neg Hx      Current Outpatient Medications:    ALPRAZolam (XANAX) 0.5 MG tablet, Take 1 tablet (0.5 mg total) by mouth at bedtime as needed for sleep., Disp: 30 tablet, Rfl: 0   aspirin 81 MG chewable tablet, Chew 1 tablet (81 mg total) by mouth daily., Disp: , Rfl:    Aspirin-Caffeine (BAYER BACK & BODY PO), Take by mouth as needed., Disp: , Rfl:    Evolocumab (REPATHA SURECLICK) 140 MG/ML SOAJ, Inject 140 mg into the skin every 14 (fourteen) days., Disp: 6 mL, Rfl: 1   Nutritional Supplements (BIFIDO-GENIC GROWTH FACTORS PO), Take 4 tablets by mouth daily., Disp: , Rfl:    pantoprazole (PROTONIX) 40 MG tablet, TAKE 1 TABLET BY MOUTH EVERY DAY, Disp: 90 tablet, Rfl: 0   rosuvastatin (CRESTOR) 20 MG tablet, TAKE 1 TABLET BY MOUTH EVERY DAY, Disp: 90 tablet, Rfl: 0   valACYclovir (VALTREX) 500 MG tablet, TAKE 1 TABLET (500 MG TOTAL) BY MOUTH AS NEEDED., Disp: 30 tablet, Rfl: 5   venlafaxine XR (EFFEXOR-XR) 75 MG 24 hr capsule, Take 1 capsule (75 mg total) by mouth daily with breakfast., Disp: 90 capsule, Rfl: 0  Review of Systems:  Negative unless indicated in HPI.   Physical Exam: Vitals:   03/13/23 1027  BP: 120/80  Pulse: 79  Temp: (!) 97.3 F (36.3 C)  TempSrc: Oral  SpO2: 99%  Weight: 163 lb 4.8 oz (74.1 kg)    Body mass index is 25.58 kg/m.   Physical Exam Vitals reviewed.  Constitutional:      Appearance: Normal appearance.  HENT:     Head: Normocephalic and atraumatic.  Eyes:     Conjunctiva/sclera: Conjunctivae normal.     Pupils: Pupils are equal, round, and reactive to light.  Skin:    General: Skin is warm and dry.  Neurological:     General: No focal deficit present.     Mental Status: She is alert and oriented to person, place, and time.  Psychiatric:        Mood and Affect: Mood normal.         Behavior: Behavior normal.        Thought Content: Thought content normal.        Judgment: Judgment normal.      Impression and Plan:  IGT (impaired glucose tolerance) -  POCT glycosylated hemoglobin (Hb A1C)  Mixed hyperlipidemia  -Agree with starting Repatha in addition to rosuvastatin for now for hyperlipidemia. -A1c is 5.9, remains in the prediabetic range although improved from previous value of 6.3.  Continue to work on lifestyle changes.   Time spent:30 minutes reviewing chart, interviewing and examining patient and formulating plan of care.     Chaya Jan, MD [facility]

## 2023-03-17 ENCOUNTER — Other Ambulatory Visit: Payer: Self-pay

## 2023-03-17 DIAGNOSIS — I739 Peripheral vascular disease, unspecified: Secondary | ICD-10-CM

## 2023-03-22 ENCOUNTER — Ambulatory Visit
Admission: RE | Admit: 2023-03-22 | Discharge: 2023-03-22 | Disposition: A | Discharge status: Home / Self Care | Payer: BC Managed Care – PPO | Source: Ambulatory Visit | Attending: Obstetrics and Gynecology | Admitting: Obstetrics and Gynecology

## 2023-03-22 DIAGNOSIS — Z780 Asymptomatic menopausal state: Secondary | ICD-10-CM

## 2023-03-22 DIAGNOSIS — Z720 Tobacco use: Secondary | ICD-10-CM

## 2023-03-23 ENCOUNTER — Ambulatory Visit: Payer: BC Managed Care – PPO | Admitting: Gastroenterology

## 2023-03-23 ENCOUNTER — Encounter: Payer: Self-pay | Admitting: Gastroenterology

## 2023-03-23 VITALS — BP 118/78 | HR 95 | Ht 66.0 in | Wt 165.0 lb

## 2023-03-23 DIAGNOSIS — K449 Diaphragmatic hernia without obstruction or gangrene: Secondary | ICD-10-CM | POA: Diagnosis not present

## 2023-03-23 DIAGNOSIS — Z79899 Other long term (current) drug therapy: Secondary | ICD-10-CM | POA: Diagnosis not present

## 2023-03-23 DIAGNOSIS — K2100 Gastro-esophageal reflux disease with esophagitis, without bleeding: Secondary | ICD-10-CM

## 2023-03-23 DIAGNOSIS — Z860100 Personal history of colon polyps, unspecified: Secondary | ICD-10-CM

## 2023-03-23 DIAGNOSIS — K219 Gastro-esophageal reflux disease without esophagitis: Secondary | ICD-10-CM

## 2023-03-23 DIAGNOSIS — K6282 Dysplasia of anus: Secondary | ICD-10-CM

## 2023-03-23 MED ORDER — PANTOPRAZOLE SODIUM 20 MG PO TBEC: 20.0000 mg | DELAYED_RELEASE_TABLET | Freq: Every day | ORAL | 1 refills | Status: DC

## 2023-03-23 NOTE — Patient Instructions (Addendum)
If your blood pressure at your visit was 140/90 or greater, please contact your primary care physician to follow up on this. ______________________________________________________  If you are age 62 or older, your body mass index should be between 23-30. Your Body mass index is 26.63 kg/m. If this is out of the aforementioned range listed, please consider follow up with your Primary Care Provider.  If you are age 26 or younger, your body mass index should be between 19-25. Your Body mass index is 26.63 kg/m. If this is out of the aformentioned range listed, please consider follow up with your Primary Care Provider.  ________________________________________________________  The [facility] providers would like to encourage you to use [facility] to communicate with providers for non-urgent requests or questions.  Due to long hold times on the telephone, sending your provider a message by [facility] may be a faster and more efficient way to get a response.  Please allow 48 business hours for a response.  Please remember that this is for non-urgent requests.  _______________________________________________________  Due to recent changes in healthcare laws, you may see the results of your imaging and laboratory studies on MyChart before your provider has had a chance to review them.  We understand that in some cases there may be results that are confusing or concerning to you. Not all laboratory results come back in the same time frame and the provider may be waiting for multiple results in order to interpret others.  Please give [place] 48 hours in order for your provider to thoroughly review all the results before contacting the office for clarification of your results.   We have sent the following medications to your pharmacy for you to pick up at your convenience: Protonix 20 mg: Take once daily for 30 days, then decrease to every other day and then stop  Thank you for entrusting me with your care and for  choosing [facility], Dr. Ileene Patrick

## 2023-03-23 NOTE — Progress Notes (Signed)
HPI :  62 year old female here for a follow-up visit.  Recall that in 2023 she had episode of hematemesis in the setting of NSAID use.  EGD with me March 2023 showed a 5 cm hernia with evidence of a healed ulceration/esophagitis.  She was placed on Protonix 40 mg twice daily.  She was having significant heartburn and reflux that bother her routinely prior to that endoscopy.  On the Protonix twice daily her symptoms were completely controlled.  At our last visit last year I recommended she try to taper this down and take once daily.  She has done so but has remained on 40 mg of Protonix once daily for some time now.  She is inquiring about long-term management of this.  She denies any breakthrough heartburn or reflux that bothers her while she is on the regimen, it works really well for her.  No dysphagia.  She is eating well, no nausea or vomiting.  No abdominal pains.  Her weight has been stable.  She just had a DEXA scan yesterday which actually showed osteopenia, T-score -1.8.  This is new for her.  We discussed long-term risks of chronic PPI use.  Recall she is also had a history of colon polyps on her last colonoscopy as well as grade 1 AIN.  She was seen by Dr. Cliffton Asters of the surgical clinic who removed this area and she was post to have follow-up with that office but I do not see that on file.  She states she did think that she saw him postoperatively but has not seen him in the past year  Prior workup:  Colonoscopy with Dr. Loreta Ave on 11/10/2017. 7 small sessile polyps.  6 in the rectosigmoid colon and 1 in the proximal ascending colon.  2 6-7 mm polyps, 1 in the rectosigmoid colon and 1 in the proximal descending colon, few small and large mouth diverticula in sigmoid colon and terminal ileum appeared normal.  Small internal hemorrhoids.  At that time repeat recommended in 3 to 5 years for surveillance based on pathology.  Pathology showed hyperplastic polyps in the rectosigmoid.  Tubular adenoma  in the descending and ascending colon.  Repeat recommended in 3 years.  This was due 11/10/2020.   EGD 08/11/21: - A 5 cm hiatal hernia was present. - There was evidence of healed esophagitis / ulceration at the GEJ, suspect likely source of prior bleeding that has since healed. The exam of the esophagus was otherwise normal. - The entire examined stomach was normal. - A large diverticulum was found in the second portion of the duodenum. - The exam of the duodenum was otherwise normal.   Colonoscopy 08/11/21: The perianal and digital rectal examinations were normal. - A diminutive polyp was found in the cecum. The polyp was sessile. The polyp was removed with a cold snare. Resection and retrieval were complete. - There was a medium-sized lipoma, in the ascending colon. - Two sessile polyps were found in the ascending colon. The polyps were 3 mm in size. These polyps were removed with a cold snare. Resection and retrieval were complete. - A 4 mm polyp was found in the hepatic flexure. The polyp was sessile. The polyp was removed with a cold snare. Resection and retrieval were complete. - Multiple small-mouthed diverticula were found in the entire colon. - Anal papilla(e) were hypertrophied. Biopsies were taken with a cold forceps for histology, rule out AIN. - Internal hemorrhoids were found during retroflexion. - The exam was otherwise without  abnormality.   1. Surgical [P], colon, cecum, ascending, hepatic flexure, polyp (4) TUBULAR ADENOMAS AND A HYPERPLASTIC POLYP. NEGATIVE FOR HIGH-GRADE DYSPLASIA. 2. Surgical [P], colon, rectum ANAL PAPILLA WITH LOW-GRADE DYSPLASIA (AIN 1). NEGATIVE FOR HIGH-GRADE DYSPLASIA AND MALIGNANCY.    Past Medical History:  Diagnosis Date   Anxiety    takes Xanax prn    Asthma    Back pain    DDD   Cancer (HCC) 2023   ankle - basal cell carcinoma   Constipation    COVID-19 04/25/2021   flu-like symptoms   Depression    takes Effexor daily    Genital herpes    GERD (gastroesophageal reflux disease)    takes Omeprazole daily   Headache    hx of migraines, sinus headaches   Hemorrhoids    History of abnormal cervical Pap smear    age 60--hx conization-paps normal since   History of bronchitis    last time 3months ago   History of colon polyps    History of hiatal hernia 07/2021   5cm per 08/11/21 EGD   History of migraine    last one 93yrs ago--with aura   HSV-1 infection    Hyperlipidemia    not on meds at present and Dr.McKenzie will review after surgery   Insomnia    Peripheral vascular disease (HCC)    PONV (postoperative nausea and vomiting)    PUD (peptic ulcer disease)    Weakness    bil legs, from pvd, resolved after aortobifemoral bypass in 2014   Wears glasses      Past Surgical History:  Procedure Laterality Date   AIN I  11/05/2021   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   AIN I   ANKLE SURGERY Left 1986   with 3 screws and 2 pens   AORTA - BILATERAL FEMORAL ARTERY BYPASS GRAFT N/A 01/30/2013   Procedure: AORTA BIFEMORAL BYPASS GRAFT;  Surgeon: Pryor Ochoa, MD;  Location: [facility] OR;  Service: Vascular;  Laterality: N/A;   BREAST SURGERY  02/2020   breast reduction    CARDIOVASCULAR STRESS TEST  01/21/2013   low risk stress test, no ischemia   CERVICAL CONE BIOPSY     age 34   COLONOSCOPY  11/11/2017   polyps, diverticulosis   COLONOSCOPY  08/11/2021   colon polyps, anal papillae (AIN)   CORONARY ANGIOPLASTY  01/2013   Aorta bi-femoral bypass   ECTOPIC PREGNANCY SURGERY     x 2   ESOPHAGOGASTRODUODENOSCOPY  08/11/2021   5 cm hiatal hernia   left fallopian tube removed     25 years ago as of 10/26/21   MASS EXCISION N/A 11/01/2021   Procedure: EXCISION OF ANAL CANAL LESION UNDER ANOSCOPY;  Surgeon: Andria Meuse, MD;  Location: [facility];  Service: General;  Laterality: N/A;   RECTAL EXAM UNDER ANESTHESIA N/A 11/01/2021   Procedure: ANORECTAL EXAM UNDER ANESTHESIA;  Surgeon: Andria Meuse, MD;  Location: [facility];  Service: General;  Laterality: N/A;   wisdom teeth extracted      when patient was 14 years   Family History  Problem Relation Age of Onset   Hyperlipidemia Mother    Asthma Maternal Grandmother    Heart attack Maternal Grandfather    Heart disease Maternal Grandfather    Hypertension Maternal Grandfather    Hypertension Paternal Grandmother    Breast cancer Other    Colon cancer Neg Hx    Esophageal cancer Neg Hx  Stomach cancer Neg Hx    Rectal cancer Neg Hx    Social History   Tobacco Use   Smoking status: Every Day    Current packs/day: 0.50    Average packs/day: 0.5 packs/day for 20.0 years (10.0 ttl pk-yrs)    Types: Cigarettes   Smokeless tobacco: Never   Tobacco comments:    Recently filled Chantix - Awaiting source of radiating leg pain origin  Vaping Use   Vaping status: Never Used  Substance Use Topics   Alcohol use: Yes    Alcohol/week: 2.0 standard drinks of alcohol    Types: 2 Glasses of wine per week    Comment: weekends only   Drug use: No   Current Outpatient Medications  Medication Sig Dispense Refill   ALPRAZolam (XANAX) 0.5 MG tablet Take 1 tablet (0.5 mg total) by mouth at bedtime as needed for sleep. 30 tablet 0   aspirin 81 MG chewable tablet Chew 1 tablet (81 mg total) by mouth daily.     Aspirin-Caffeine (BAYER BACK & BODY PO) Take by mouth as needed.     Evolocumab (REPATHA SURECLICK) 140 MG/ML SOAJ Inject 140 mg into the skin every 14 (fourteen) days. 6 mL 1   Nutritional Supplements (BIFIDO-GENIC GROWTH FACTORS PO) Take 4 tablets by mouth daily.     pantoprazole (PROTONIX) 40 MG tablet TAKE 1 TABLET BY MOUTH EVERY DAY 90 tablet 0   rosuvastatin (CRESTOR) 20 MG tablet TAKE 1 TABLET BY MOUTH EVERY DAY 90 tablet 0   valACYclovir (VALTREX) 500 MG tablet TAKE 1 TABLET (500 MG TOTAL) BY MOUTH AS NEEDED. 30 tablet 5   venlafaxine XR (EFFEXOR-XR) 75 MG 24 hr capsule Take 1 capsule (75 mg  total) by mouth daily with breakfast. 90 capsule 0   No current facility-administered medications for this visit.   Allergies  Allergen Reactions   Lipitor [Atorvastatin]     Muscle aches   Septra [Sulfamethoxazole-Trimethoprim] Other (See Comments)    Unknown    Simvastatin     Muscle cramps      Review of Systems: All systems reviewed and negative except where noted in HPI.    DG BONE DENSITY (DXA)  Result Date: 03/22/2023 EXAM: DUAL X-RAY ABSORPTIOMETRY (DXA) FOR BONE MINERAL DENSITY IMPRESSION: Referring Physician:  Patton Salles Your patient completed a bone mineral density test using GE Lunar iDXA system (analysis version: 16). Technologist: BEC PATIENT: Name: Mazel, Villela Patient ID: [id number] Birth Date: 13-Aug-1960 Height: 65.5 in. Sex: Female Measured: 03/22/2023 Weight: 162.8 lbs. Indications: Caucasian, Estrogen Deficient, One ovary removed, Postmenopausal, Protonix, Tobacco User (Current Smoker) Fractures: Treatments: None ASSESSMENT: The BMD measured at Femur Neck Right is 0.785 g/cm2 with a T-score of -1.8. This patient is considered osteopenic/low bone mass according to World Health Organization [facility]) criteria. The quality of the exam is good. Site Region Measured Date Measured Age YA BMD Significant CHANGE T-score DualFemur Neck Right 03/22/2023    62.0         -1.8    0.785 g/cm2 AP Spine  L1-L4      03/22/2023    62.0         -1.2    1.042 g/cm2 DualFemur Total Mean 03/22/2023    62.0         -0.1    0.996 g/cm2 World Health Organization [facility]) criteria for post-menopausal, Caucasian Women: Normal       T-score at or above -1 SD Osteopenia   T-score between -1  and -2.5 SD Osteoporosis T-score at or below -2.5 SD RECOMMENDATION: 1. All patients should optimize calcium and vitamin D intake. 2. Consider FDA-approved medical therapies in postmenopausal women and men aged 47 years and older, based on the following: a. A hip or vertebral (clinical or morphometric)  fracture. b. T-score = -2.5 at the femoral neck or spine after appropriate evaluation to exclude secondary causes. c. Low bone mass (T-score between -1.0 and -2.5 at the femoral neck or spine) and a 10-year probability of a hip fracture = 3% or a 10-year probability of a major osteoporosis-related fracture = 20% based on the [place]-adapted WHO algorithm. d. Clinician judgment and/or patient preferences may indicate treatment for people with 10-year fracture probabilities above or below these levels. FOLLOW-UP: Patients with diagnosis of osteoporosis or at high risk for fracture should have regular bone mineral density tests.? Patients eligible for Medicare are allowed routine testing every 2 years.? The testing frequency can be increased to one year for patients who have rapidly progressing disease, are receiving or discontinuing medical therapy to restore bone mass, or have additional risk factors. I have reviewed this study and agree with the findings. [facility] FRAX* 10-year Probability of Fracture Based on femoral neck BMD: DualFemur (Right) Major Osteoporotic Fracture: 9.8% Hip Fracture:                1.9% Population:                  [place] (Caucasian) Risk Factors:                Tobacco User (Current Smoker) *FRAX is a Armed forces logistics/support/administrative officer of the [employer] of [employer] for Metabolic Bone Disease, a World Science writer (WHO) [employer]. ASSESSMENT: The probability of a major osteoporotic fracture is 9.8% within the next ten years. The probability of a hip fracture is 1.9% within the next ten years. Electronically Signed   By: Romona Curls M.D.   On: 03/22/2023 09:39    Lab Results  Component Value Date   WBC 7.7 04/26/2022   HGB 15.9 (H) 04/26/2022   HCT 46.1 (H) 04/26/2022   MCV 92.3 04/26/2022   PLT 235.0 04/26/2022    Lab Results  Component Value Date   NA 139 04/26/2022   CL 103 04/26/2022   K 4.7 04/26/2022   CO2 27 04/26/2022   BUN 17 04/26/2022    CREATININE 0.88 04/26/2022   GFR 71.09 04/26/2022   CALCIUM 10.1 04/26/2022   ALBUMIN 4.7 02/03/2023   GLUCOSE 90 04/26/2022    Lab Results  Component Value Date   ALT 25 02/03/2023   AST 23 02/03/2023   ALKPHOS 87 02/03/2023   BILITOT 0.4 02/03/2023      Physical Exam: BP 118/78   Pulse 95   Ht 5\' 6"  (1.676 m)   Wt 165 lb (74.8 kg)   LMP 10/31/2010   BMI 26.63 kg/m  Constitutional: Pleasant,well-developed, female in no acute distress. Neurological: Alert and oriented to person place and time. Psychiatric: Normal mood and affect. Behavior is normal.   ASSESSMENT: 62 y.o. female here for assessment of the following  1. Gastroesophageal reflux disease with esophagitis, unspecified whether hemorrhage   2. Hiatal hernia   3. Long-term current use of proton pump inhibitor therapy   4. History of colon polyps   5. AIN grade I    Moderate to large hiatal hernia with history of GERD and hematemesis from suspected ulceration at the GEJ.  She has been on PPI since then with excellent control of her symptoms.  We discussed long-term management.  I discussed long-term risks of chronic PPI use to include increased risk for renal insufficiency, increased risk for bone fracture especially in the setting of her recently diagnosed osteopenia, amongst others.  Long-term agree we should try to use the lowest dose of PPI needed to control symptoms or wean off if she tolerates.  She is currently on Protonix 40 mg once daily, recommend reduction to 20 mg once daily for the next month and see how she does.  If she continues to do well on that dosing, she can take 20 mg every other day for a few weeks.  If she continues to do well with that then she can stop the omeprazole at baseline and just use as needed for recurrent symptoms.  Alternatively she could also try Pepcid if she needs something as needed in the future.  She agrees with the plan as outlined and will let me know if any issues while  she tapers off.  With her moderate to large hiatal hernia she understands she is at risk for recurrent reflux symptoms and this may occur when she stops the medicine.  There is no Barrett's on her last endoscopy.  Otherwise due for surveillance colonoscopy in March 2028.  She is due for follow-up with Dr. Cliffton Asters in the surgical clinic for AIN, I recommend she contact his office for scheduling.  She agrees   PLAN: - discussed long term risks / benefits of chronic PPI use - she is doing well on 40mg  / day. She does have ostepenia which we discussed as above. Will plan to reduce to 20mg  / day for 1 month and then take every other day for a few weeks, can stop and use PRN moving forward. At risk for recurrent symptoms with hiatal hernia hernia - wrote prescription for protonix 20mg  / day PRN - repeat colonoscopy 07/2026 - call [facility] to follow up with them for AIN  Harlin Rain, MD [facility]

## 2023-03-27 ENCOUNTER — Encounter: Payer: Self-pay | Admitting: Obstetrics and Gynecology

## 2023-04-13 ENCOUNTER — Other Ambulatory Visit: Payer: Self-pay | Admitting: Internal Medicine

## 2023-04-13 DIAGNOSIS — E785 Hyperlipidemia, unspecified: Secondary | ICD-10-CM

## 2023-04-18 IMAGING — DX DG HIP (WITH OR WITHOUT PELVIS) 2-3V*L*
3 series · 3 of 3 positions shown · non-contrast
Comparison: 07/28/2003

CLINICAL DATA: 60-year-old female with left groin pain.

EXAM:
DG HIP (WITH OR WITHOUT PELVIS) 2-3V LEFT

[pelvis ap]
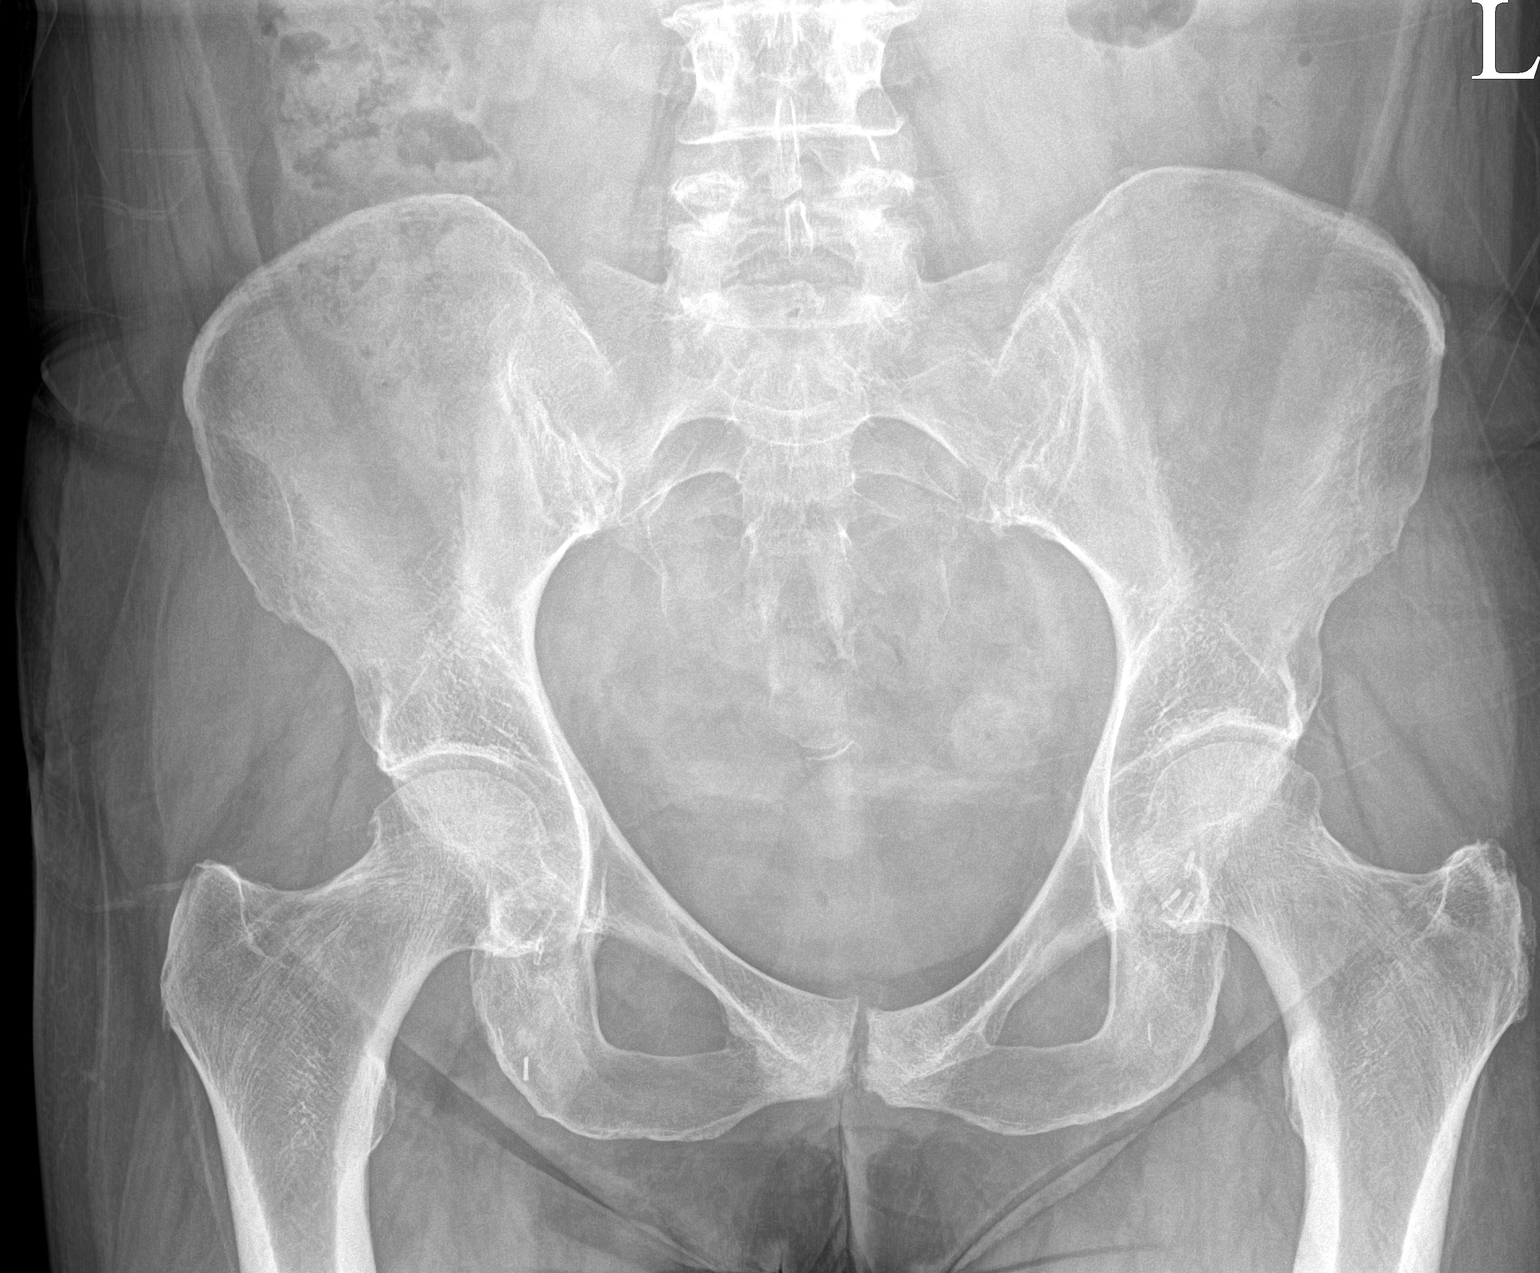

[hip ap]
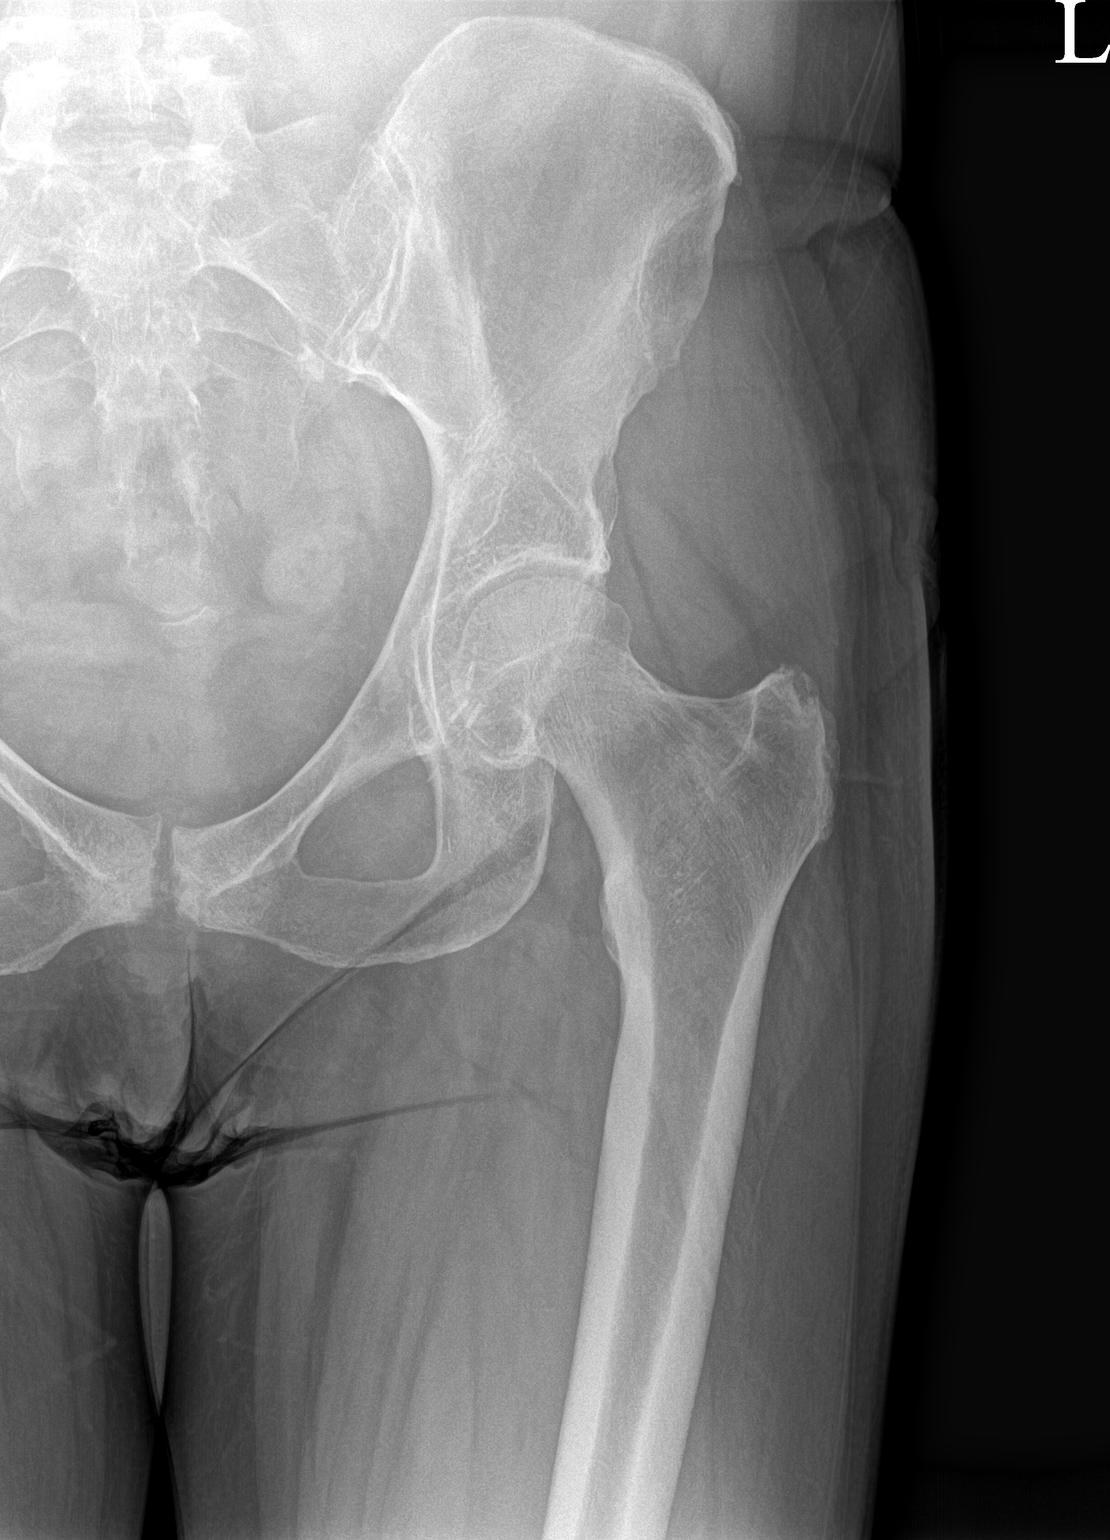

[hip frog leg]
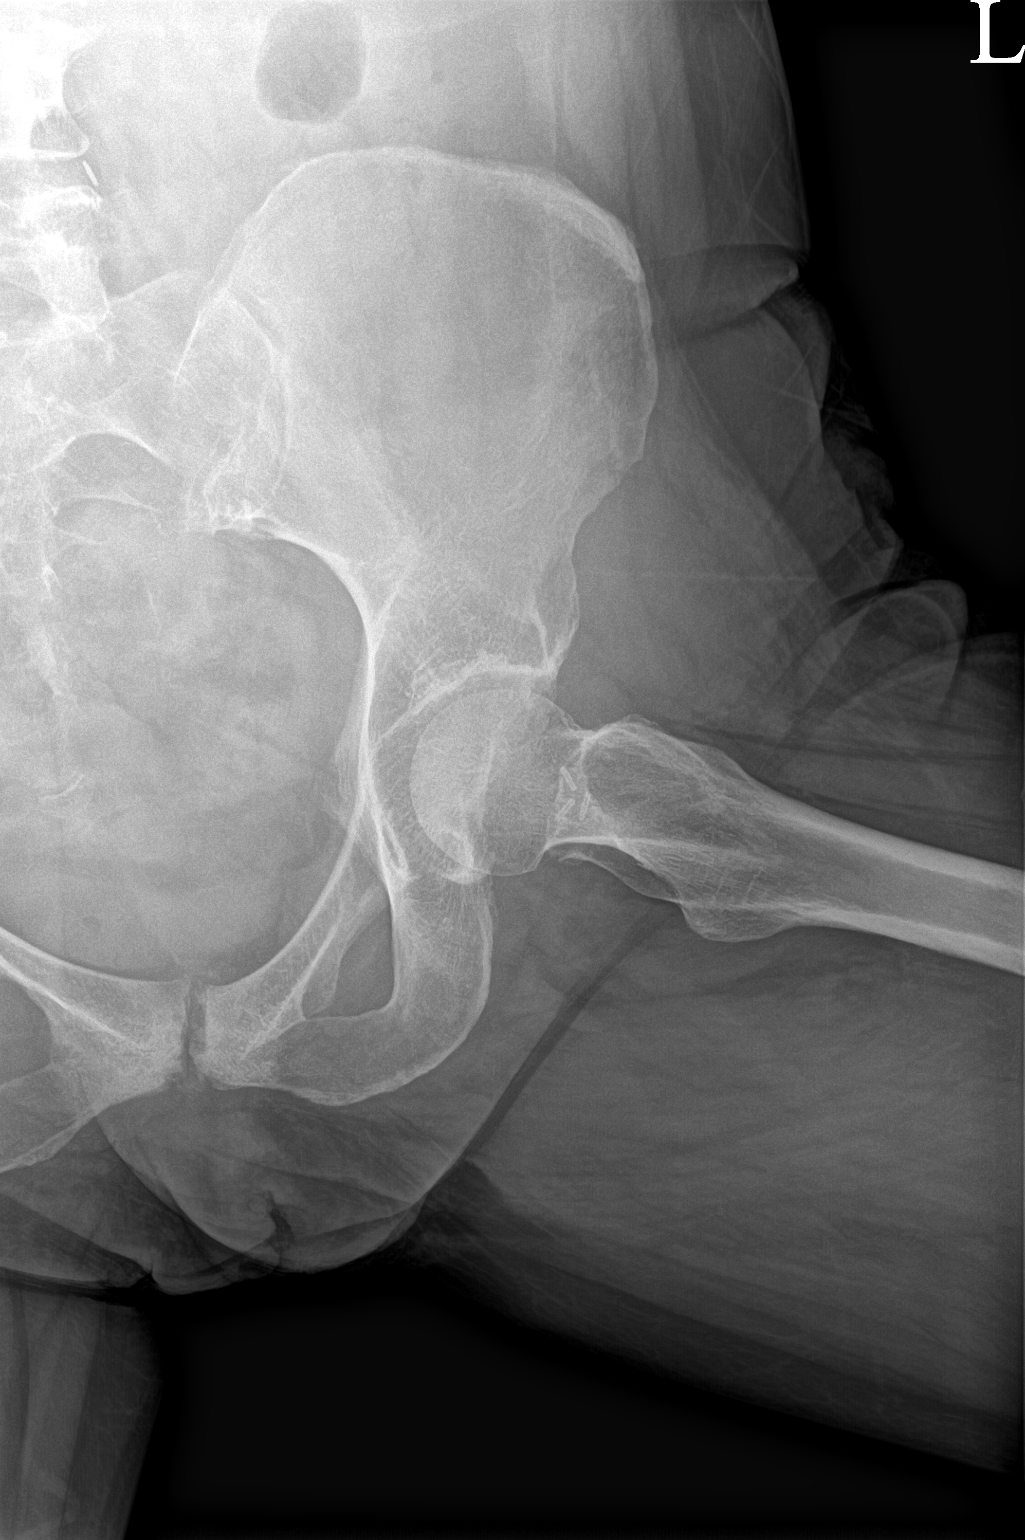

[3 of 3 positions shown; findings below may reference images not displayed]

FINDINGS: There is no evidence of hip fracture or dislocation. There is no
evidence of arthropathy or other focal bone abnormality. Surgical
clips are visualized in the bilateral inguinal regions.
IMPRESSION: No acute fracture, malalignment, or significant degenerative change.

## 2023-04-24 ENCOUNTER — Other Ambulatory Visit: Payer: Self-pay | Admitting: Gastroenterology

## 2023-04-24 DIAGNOSIS — K219 Gastro-esophageal reflux disease without esophagitis: Secondary | ICD-10-CM

## 2023-06-01 ENCOUNTER — Other Ambulatory Visit: Payer: Self-pay | Admitting: Adult Health

## 2023-06-21 ENCOUNTER — Ambulatory Visit: Payer: Self-pay | Admitting: Internal Medicine

## 2023-06-21 NOTE — Telephone Encounter (Signed)
 Copied from CRM [id number]. Topic: Clinical - Red Word Triage >> Jun 21, 2023  2:29 PM Robinson H wrote: [facility] that prompted transfer to Nurse Triage: Symptoms getting worse stuffy nose, cough, right ear pain progressively gotten worse accompanied with a sharp pain on right side of head.  Chief Complaint: right ear pain Symptoms: pain, allergy symptoms, ear feels clogged, right eye pain, right forehead, runny nose and cough Frequency: pain comes and goes Pertinent Negatives: Patient denies fever, sob Disposition: [] ED /[] Urgent Care (no appt availability in office) / [x] Appointment(In office/virtual)/ []  [facility]/ [] Home Care/ [] Refused Recommended Disposition /[] [place] [facility]/ []  Follow-up with PCP Additional Notes: States pain started in right ear 5 days ago.  States has had this problem before.  Apt made for tomorrow am with pcp.  Care advice given, instructed to go to er if becomes worse.  Pcp updated; patient denies questions.   Reason for Disposition  White, yellow, or green discharge  Answer Assessment - Initial Assessment Questions 1. LOCATION: Which ear is involved?     Right ear 2. ONSET: When did the ear start hurting      5 days ago 3. SEVERITY: How bad is the pain?  (Scale 1-10; mild, moderate or severe)   - MILD (1-3): doesn't interfere with normal activities    - MODERATE (4-7): interferes with normal activities or awakens from sleep    - SEVERE (8-10): excruciating pain, unable to do any normal activities      7/10 off and on 4. URI SYMPTOMS: Do you have a runny nose or cough?     Runny nose, cough 5. FEVER: Do you have a fever? If Yes, ask: What is your temperature, how was it measured, and when did it start?     denies 6. CAUSE: Have you been swimming recently?, How often do you use Q-TIPS?, Have you had any recent air travel or scuba diving?     na 7. OTHER SYMPTOMS: Do you have any other symptoms? (e.g., headache, stiff  neck, dizziness, vomiting, runny nose, decreased hearing)     Runny nose and clogged ear, decreased in right hear (hearing)  Protocols used: [place]

## 2023-06-22 ENCOUNTER — Encounter: Payer: Self-pay | Admitting: Internal Medicine

## 2023-06-22 ENCOUNTER — Ambulatory Visit (INDEPENDENT_AMBULATORY_CARE_PROVIDER_SITE_OTHER): Payer: 59 | Admitting: Internal Medicine

## 2023-06-22 VITALS — BP 110/84 | Temp 97.8°F | Wt 162.9 lb

## 2023-06-22 DIAGNOSIS — J069 Acute upper respiratory infection, unspecified: Secondary | ICD-10-CM

## 2023-06-22 DIAGNOSIS — H66001 Acute suppurative otitis media without spontaneous rupture of ear drum, right ear: Secondary | ICD-10-CM | POA: Diagnosis not present

## 2023-06-22 DIAGNOSIS — F411 Generalized anxiety disorder: Secondary | ICD-10-CM | POA: Diagnosis not present

## 2023-06-22 MED ORDER — ALPRAZOLAM 0.5 MG PO TABS: 0.5000 mg | ORAL_TABLET | Freq: Every evening | ORAL | 0 refills | Status: DC | PRN

## 2023-06-22 MED ORDER — AMOXICILLIN-POT CLAVULANATE 875-125 MG PO TABS: 1.0000 | ORAL_TABLET | Freq: Two times a day (BID) | ORAL | 0 refills | Status: AC

## 2023-06-22 NOTE — Progress Notes (Signed)
 Established Patient Office Visit     CC/Reason for Visit: URI symptoms and right ear pain  HPI: [name] is a 63 y.o. female who is coming in today for the above mentioned reasons for the past 2 weeks has been having URI symptoms mainly with post nasal drip, purulent nasal discharge, frontal sinus pain and pressure.  Over the last 2 days has developed a severe pain in her right ear.  She is prone to having ear infections.  She is requesting refills of Ativan .   Past Medical/Surgical History: Past Medical History:  Diagnosis Date   Anxiety    takes Xanax  prn    Asthma    Back pain    DDD   Cancer (HCC) 2023   ankle - basal cell carcinoma   Constipation    COVID-19 04/25/2021   flu-like symptoms   Depression    takes Effexor  daily   Genital herpes    GERD (gastroesophageal reflux disease)    takes Omeprazole daily   Headache    hx of migraines, sinus headaches   Hemorrhoids    History of abnormal cervical Pap smear    age 52--hx conization-paps normal since   History of bronchitis    last time 3months ago   History of colon polyps    History of hiatal hernia 07/2021   5cm per 08/11/21 EGD   History of migraine    last one 51yrs ago--with aura   HSV-1 infection    Hyperlipidemia    not on meds at present and Dr.McKenzie will review after surgery   Insomnia    Osteopenia 2024   hip and spine   Peripheral vascular disease (HCC)    PONV (postoperative nausea and vomiting)    PUD (peptic ulcer disease)    Weakness    bil legs, from pvd, resolved after aortobifemoral bypass in 2014   Wears glasses     Past Surgical History:  Procedure Laterality Date   AIN I  11/05/2021   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   AIN I   ANKLE SURGERY Left 1986   with 3 screws and 2 pens   AORTA - BILATERAL FEMORAL ARTERY BYPASS GRAFT N/A 01/30/2013   Procedure: AORTA BIFEMORAL BYPASS GRAFT;  Surgeon: Lynwood JONETTA Collum, MD;  Location: [facility] OR;  Service: Vascular;  Laterality: N/A;    BREAST SURGERY  02/2020   breast reduction    CARDIOVASCULAR STRESS TEST  01/21/2013   low risk stress test, no ischemia   CERVICAL CONE BIOPSY     age 76   COLONOSCOPY  11/11/2017   polyps, diverticulosis   COLONOSCOPY  08/11/2021   colon polyps, anal papillae (AIN)   CORONARY ANGIOPLASTY  01/2013   Aorta bi-femoral bypass   ECTOPIC PREGNANCY SURGERY     x 2   ESOPHAGOGASTRODUODENOSCOPY  08/11/2021   5 cm hiatal hernia   left fallopian tube removed     25 years ago as of 10/26/21   MASS EXCISION N/A 11/01/2021   Procedure: EXCISION OF ANAL CANAL LESION UNDER ANOSCOPY;  Surgeon: Teresa Lonni HERO, MD;  Location: [facility];  Service: General;  Laterality: N/A;   RECTAL EXAM UNDER ANESTHESIA N/A 11/01/2021   Procedure: ANORECTAL EXAM UNDER ANESTHESIA;  Surgeon: Teresa Lonni HERO, MD;  Location: [facility];  Service: General;  Laterality: N/A;   wisdom teeth extracted      when patient was 14 years    Social History:  reports that she has been smoking cigarettes. She has a 10 pack-year smoking history. She has never used smokeless tobacco. She reports current alcohol use of about 2.0 standard drinks of alcohol per week. She reports that she does not use drugs.  Allergies: Allergies  Allergen Reactions   Lipitor [Atorvastatin ]     Muscle aches   Septra [Sulfamethoxazole-Trimethoprim] Other (See Comments)    Unknown    Simvastatin     Muscle cramps     Family History:  Family History  Problem Relation Age of Onset   Hyperlipidemia Mother    Asthma Maternal Grandmother    Heart attack Maternal Grandfather    Heart disease Maternal Grandfather    Hypertension Maternal Grandfather    Hypertension Paternal Grandmother    Breast cancer Other    Colon cancer Neg Hx    Esophageal cancer Neg Hx    Stomach cancer Neg Hx    Rectal cancer Neg Hx      Current Outpatient Medications:    amoxicillin -clavulanate (AUGMENTIN ) 875-125 MG tablet,  Take 1 tablet by mouth 2 (two) times daily for 7 days., Disp: 14 tablet, Rfl: 0   aspirin  81 MG chewable tablet, Chew 1 tablet (81 mg total) by mouth daily., Disp: , Rfl:    Aspirin -Caffeine (BAYER BACK & BODY PO), Take by mouth as needed., Disp: , Rfl:    Evolocumab  (REPATHA  SURECLICK) 140 MG/ML SOAJ, Inject 140 mg into the skin every 14 (fourteen) days., Disp: 6 mL, Rfl: 1   Nutritional Supplements (BIFIDO-GENIC GROWTH FACTORS PO), Take 4 tablets by mouth daily., Disp: , Rfl:    pantoprazole  (PROTONIX ) 20 MG tablet, TAKE 1 TABLET (20 MG TOTAL) BY MOUTH DAILY. AFTER 30 DAYS, DECREASE TO EVERY OTHER DAY AND THEN STOP, Disp: 90 tablet, Rfl: 0   rosuvastatin  (CRESTOR ) 20 MG tablet, TAKE 1 TABLET BY MOUTH EVERY DAY, Disp: 90 tablet, Rfl: 0   valACYclovir  (VALTREX ) 500 MG tablet, TAKE 1 TABLET (500 MG TOTAL) BY MOUTH AS NEEDED., Disp: 30 tablet, Rfl: 5   venlafaxine  XR (EFFEXOR -XR) 75 MG 24 hr capsule, TAKE 1 CAPSULE BY MOUTH DAILY WITH BREAKFAST., Disp: 90 capsule, Rfl: 0   ALPRAZolam  (XANAX ) 0.5 MG tablet, Take 1 tablet (0.5 mg total) by mouth at bedtime as needed for sleep., Disp: 30 tablet, Rfl: 0  Review of Systems:  Negative unless indicated in HPI.   Physical Exam: Vitals:   06/22/23 0733  BP: 110/84  Temp: 97.8 F (36.6 C)  TempSrc: Oral  SpO2: 98%  Weight: 162 lb 14.4 oz (73.9 kg)    Body mass index is 26.29 kg/m.   Physical Exam Vitals reviewed.  Constitutional:      Appearance: Normal appearance.  HENT:     Right Ear: Ear canal and external ear normal. Tympanic membrane is bulging.     Left Ear: Ear canal and external ear normal. A middle ear effusion is present.     Ears:     Comments: Purulent fluid is present behind right tympanic membrane.    Mouth/Throat:     Mouth: Mucous membranes are moist.     Pharynx: Oropharynx is clear.  Eyes:     Conjunctiva/sclera: Conjunctivae normal.     Pupils: Pupils are equal, round, and reactive to light.  Cardiovascular:      Rate and Rhythm: Normal rate and regular rhythm.  Pulmonary:     Effort: Pulmonary effort is normal.     Breath sounds: Normal breath sounds.  Neurological:  Mental Status: She is alert.      Impression and Plan:  Viral URI with cough  Acute suppurative otitis media of right ear without spontaneous rupture of tympanic membrane, recurrence not specified -     Amoxicillin -Pot Clavulanate; Take 1 tablet by mouth 2 (two) times daily for 7 days.  Dispense: 14 tablet; Refill: 0  GAD (generalized anxiety disorder) -     ALPRAZolam ; Take 1 tablet (0.5 mg total) by mouth at bedtime as needed for sleep.  Dispense: 30 tablet; Refill: 0   -Refill Ativan  per protocol. -Suspect her upper respiratory infection has culminated in a right ear otitis media.  Treat with Augmentin .  Time spent:30 minutes reviewing chart, interviewing and examining patient and formulating plan of care.     [name], MD [facility]

## 2023-07-04 ENCOUNTER — Ambulatory Visit: Payer: Self-pay | Admitting: Internal Medicine

## 2023-07-04 ENCOUNTER — Encounter: Payer: Self-pay | Admitting: Family Medicine

## 2023-07-04 ENCOUNTER — Ambulatory Visit: Payer: 59 | Admitting: Family Medicine

## 2023-07-04 VITALS — BP 134/72 | HR 80 | Temp 97.7°F | Ht 66.0 in | Wt 161.7 lb

## 2023-07-04 DIAGNOSIS — H9222 Otorrhagia, left ear: Secondary | ICD-10-CM

## 2023-07-04 NOTE — Patient Instructions (Signed)
Bleeding is from left lower ear canal.   No eardrum problem and no sign of ear infection.

## 2023-07-04 NOTE — Telephone Encounter (Signed)
  Chief Complaint: Ear bleed Symptoms: Left ear bleeding, ear popping, headache, dizziness Frequency: Off and on Pertinent Negatives: Patient denies Head injury, ear injury, n/v Disposition: [] ED /[] Urgent Care (no appt availability in office) / [x] Appointment(In office/virtual)/ []  [facility]/ [] Home Care/ [] Refused Recommended Disposition /[] [facility]/ []  Follow-up with PCP Additional Notes: Patient called stating that she got out of the shower today and stuck [name] q-tip in ear to clean it and pulled it out with it saturated in blood. Patient states that she has been on recent abx for ear infection but is still having symptoms. Patient denies any abnormal hearing loss, ear injury, n/v, head injury. Patient states she has mild occasional dizziness, headache, ear popping and feels like her head is in [name] "refrigerator." Patient advised by this RN to be seen within 24 hours to which patient was agreeable. Appt scheduled. Patient verbalized understanding of POC.  Copied from CRM [id number]. Topic: Clinical - Red Word Triage >> Jul 04, 2023 10:22 AM [name] wrote: Red Word that prompted transfer to Nurse Triage: Patient states she was recently seen for an ear infection, she finished the antibiotics on Thursday. Patients ears are still popping, experiencing headache and states her left ear is bleeding. Reason for Disposition  Unexplained bleeding from ear  Answer Assessment - Initial Assessment Questions 1. LOCATION: "Which ear is involved?"      Left ear 2. COLOR: "What is the color of the discharge?"      Red (blood) 3. CONSISTENCY: "How runny is the discharge? Could it be water?"      Blood 4. ONSET: "When did you first notice the discharge?"     Today 5. PAIN: "Is there any earache?" "How bad is it?"  (Scale 1-10; or mild, moderate, severe)     Moderate 6. OBJECTS: "Have you put anything in your ear?" (e.g., Q-tip, other object)      Q-tip "I didn't injure myself I just  used it to clean my ear then when I pulled it out it was bright red and now my ear feels irritated." 7. OTHER SYMPTOMS: "Do you have any other symptoms?" (e.g., headache, fever, dizziness, vomiting, runny nose)     Headache, ear popping, dizziness.  Protocols used: Ear - Discharge-[name]-AH

## 2023-07-04 NOTE — Progress Notes (Signed)
Established Patient Office Visit  Subjective   Patient ID: [name], female    DOB: [birth year]  Age: 63 y.o. MRN: [id number]  Chief Complaint  Patient presents with   ear bleeding     HPI   [name] is seen with acute bleeding left ear canal after removing a Q-tip from the canal after getting ready this morning.  She was seen here on the sixth and diagnosed with suppurative otitis on the right with effusion on the left.  Treated with Augmentin.  Denies any right ear symptoms at this time.  No hearing loss.  Does have occasional sensation of "bilateral ear popping ".  Some nasal congestion.  No acute hearing loss.  Denies any ear pain.  Past Medical History:  Diagnosis Date   Anxiety    takes Xanax prn    Asthma    Back pain    DDD   Cancer (HCC) 2023   ankle - basal cell carcinoma   Constipation    COVID-19 04/25/2021   flu-like symptoms   Depression    takes Effexor daily   Genital herpes    GERD (gastroesophageal reflux disease)    takes Omeprazole daily   Headache    hx of migraines, sinus headaches   Hemorrhoids    History of abnormal cervical Pap smear    age 81--hx conization-paps normal since   History of bronchitis    last time 3months ago   History of colon polyps    History of hiatal hernia 07/2021   5cm per 08/11/21 EGD   History of migraine    last one 16yrs ago--with aura   HSV-1 infection    Hyperlipidemia    not on meds at present and Dr.McKenzie will review after surgery   Insomnia    Osteopenia 2024   hip and spine   Peripheral vascular disease (HCC)    PONV (postoperative nausea and vomiting)    PUD (peptic ulcer disease)    Weakness    bil legs, from pvd, resolved after aortobifemoral bypass in 2014   Wears glasses    Past Surgical History:  Procedure Laterality Date   AIN I  11/05/2021   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   AIN I   ANKLE SURGERY Left 1986   with 3 screws and 2 pens   AORTA - BILATERAL FEMORAL ARTERY BYPASS GRAFT N/A  01/30/2013   Procedure: AORTA BIFEMORAL BYPASS GRAFT;  Surgeon: Pryor Ochoa, MD;  Location: [facility] OR;  Service: Vascular;  Laterality: N/A;   BREAST SURGERY  02/2020   breast reduction    CARDIOVASCULAR STRESS TEST  01/21/2013   low risk stress test, no ischemia   CERVICAL CONE BIOPSY     age 9   COLONOSCOPY  11/11/2017   polyps, diverticulosis   COLONOSCOPY  08/11/2021   colon polyps, anal papillae (AIN)   CORONARY ANGIOPLASTY  01/2013   Aorta bi-femoral bypass   ECTOPIC PREGNANCY SURGERY     x 2   ESOPHAGOGASTRODUODENOSCOPY  08/11/2021   5 cm hiatal hernia   left fallopian tube removed     25 years ago as of 10/26/21   MASS EXCISION N/A 11/01/2021   Procedure: EXCISION OF ANAL CANAL LESION UNDER ANOSCOPY;  Surgeon: Andria Meuse, MD;  Location: [facility];  Service: General;  Laterality: N/A;   RECTAL EXAM UNDER ANESTHESIA N/A 11/01/2021   Procedure: ANORECTAL EXAM UNDER ANESTHESIA;  Surgeon: Andria Meuse, MD;  Location: [facility];  Service: General;  Laterality: N/A;   wisdom teeth extracted      when patient was 14 years    reports that she has been smoking cigarettes. She has a 10 pack-year smoking history. She has never used smokeless tobacco. She reports current alcohol use of about 2.0 standard drinks of alcohol per week. She reports that she does not use drugs. family history includes Asthma in her maternal grandmother; Breast cancer in an other family member; Heart attack in her maternal grandfather; Heart disease in her maternal grandfather; Hyperlipidemia in her mother; Hypertension in her maternal grandfather and paternal grandmother. Allergies  Allergen Reactions   Lipitor [Atorvastatin]     Muscle aches   Septra [Sulfamethoxazole-Trimethoprim] Other (See Comments)    Unknown    Simvastatin     Muscle cramps     Review of Systems  Constitutional:  Negative for chills and fever.  HENT:  Negative for ear discharge,  ear pain and hearing loss.       Objective:     BP 134/72 (BP Location: Left Arm, Patient Position: Sitting, Cuff Size: Normal)   Pulse 80   Temp 97.7 F (36.5 C) (Oral)   Ht 5\' 6"  (1.676 m)   Wt 161 lb 11.2 oz (73.3 kg)   LMP 10/31/2010   SpO2 96%   BMI 26.10 kg/m  BP Readings from Last 3 Encounters:  07/04/23 134/72  06/22/23 110/84  03/23/23 118/78   Wt Readings from Last 3 Encounters:  07/04/23 161 lb 11.2 oz (73.3 kg)  06/22/23 162 lb 14.4 oz (73.9 kg)  03/23/23 165 lb (74.8 kg)      Physical Exam Vitals reviewed.  Constitutional:      General: She is not in acute distress.    Appearance: She is not ill-appearing.  HENT:     Ears:     Comments: Right ear canal is normal.  Right eardrum no acute changes.  Left canal reveals some fresh blood along the inferior portion of the canal.  No active bleeding.  Eardrum appears entirely normal.  No effusion.  No erythema. Cardiovascular:     Rate and Rhythm: Normal rate and regular rhythm.  Pulmonary:     Effort: Pulmonary effort is normal.     Breath sounds: Normal breath sounds.  Neurological:     Mental Status: She is alert.      No results found for any visits on 07/04/23.    The 10-year ASCVD risk score (Arnett DK, et al., 2019) is: 9.1%    Assessment & Plan:   Mild bleeding left external canal along the inferior portion.  Recent Q-tip use but she states she was very gentle with this.  Bleeding is relatively minimal but does appear to be recent.  Eardrum appears entirely normal.  No evidence for ear trauma otherwise.  -Reassurance. -Avoid foreign bodies and canal as much as possible Evelena Peat, MD

## 2023-07-10 ENCOUNTER — Other Ambulatory Visit: Payer: Self-pay | Admitting: Internal Medicine

## 2023-07-10 ENCOUNTER — Other Ambulatory Visit: Payer: Self-pay

## 2023-07-10 DIAGNOSIS — Z87891 Personal history of nicotine dependence: Secondary | ICD-10-CM

## 2023-07-10 DIAGNOSIS — F17210 Nicotine dependence, cigarettes, uncomplicated: Secondary | ICD-10-CM

## 2023-07-10 DIAGNOSIS — Z122 Encounter for screening for malignant neoplasm of respiratory organs: Secondary | ICD-10-CM

## 2023-07-10 DIAGNOSIS — Z1231 Encounter for screening mammogram for malignant neoplasm of breast: Secondary | ICD-10-CM

## 2023-07-10 IMAGING — DX DG CHEST 2V
2 series · 2 of 2 positions shown · non-contrast
Comparison: 01/31/2013

CLINICAL DATA: Recent UTI, crackles heard in the left lower lung
fields.

EXAM:
CHEST - 2 VIEW

[chest pa]
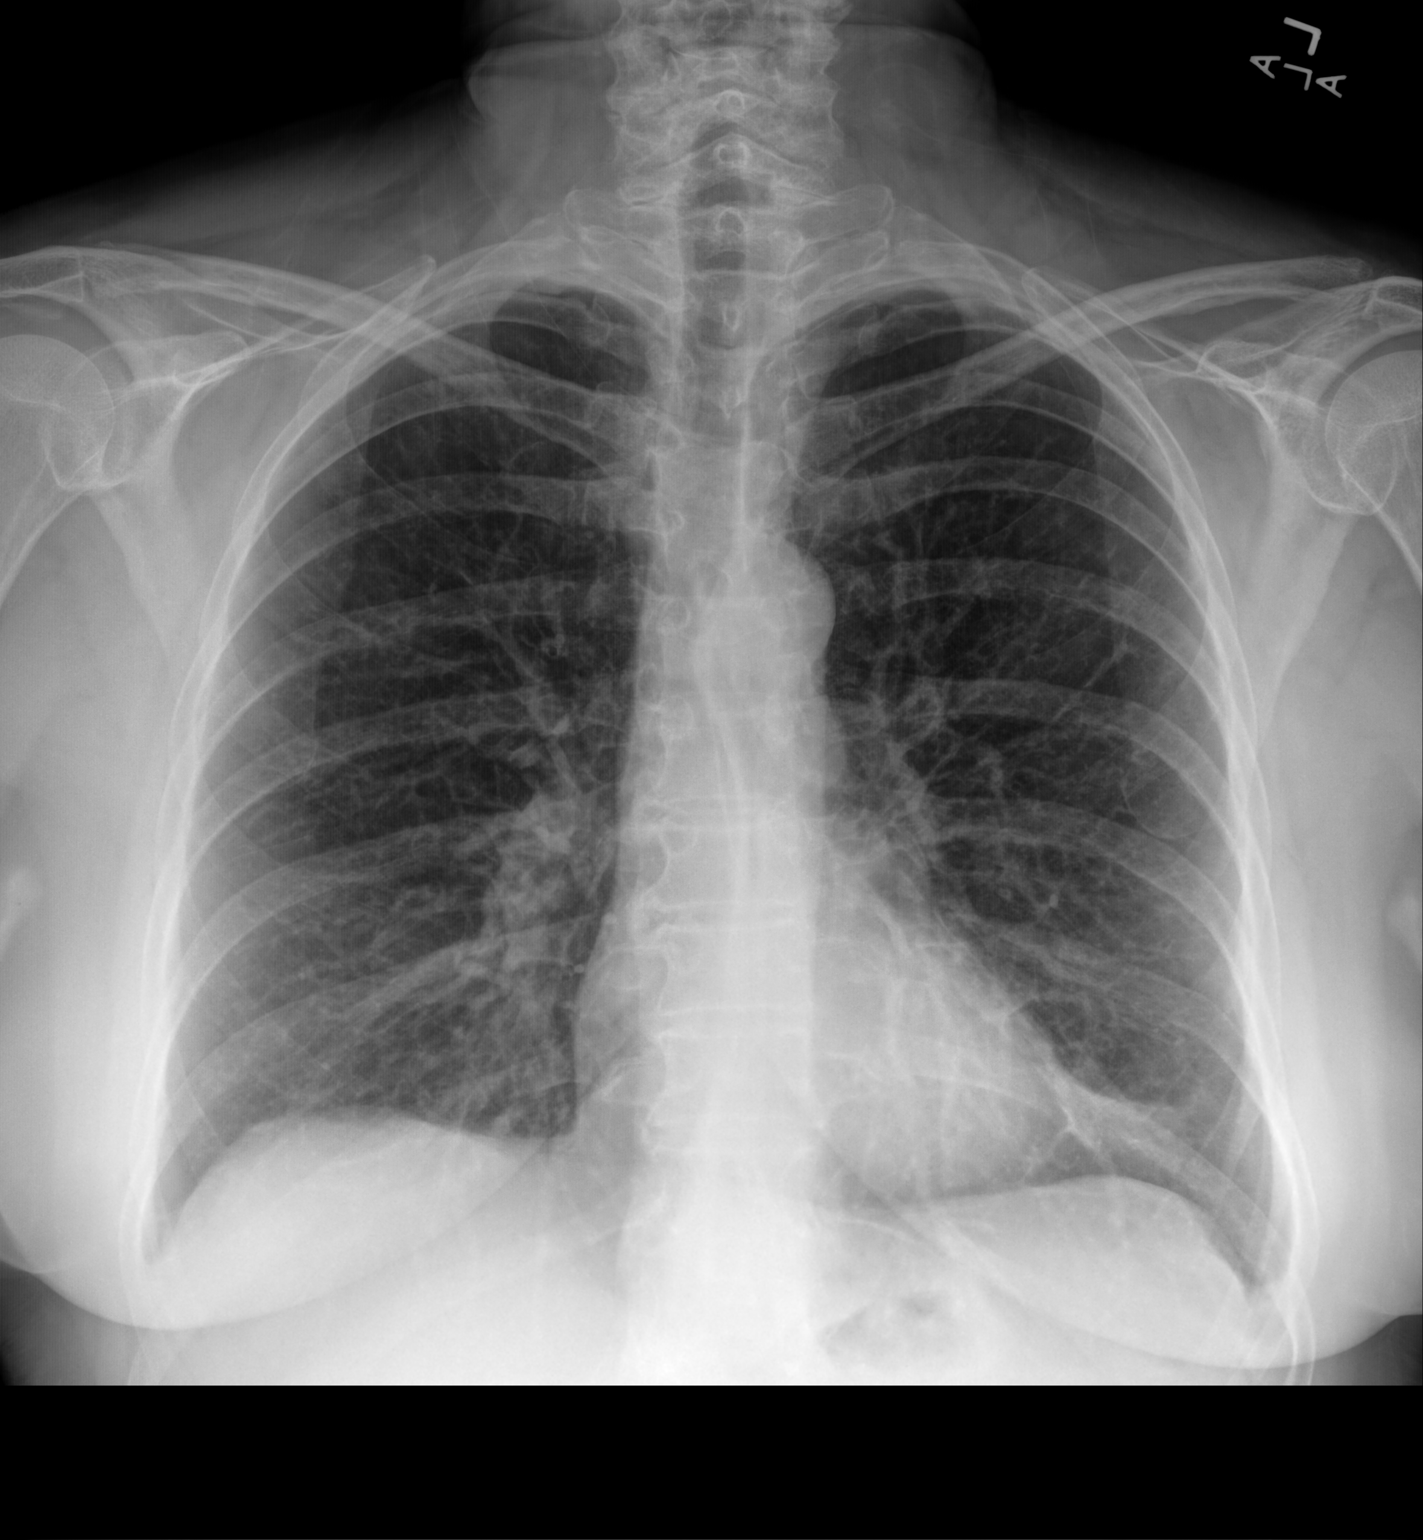

[chest lat]
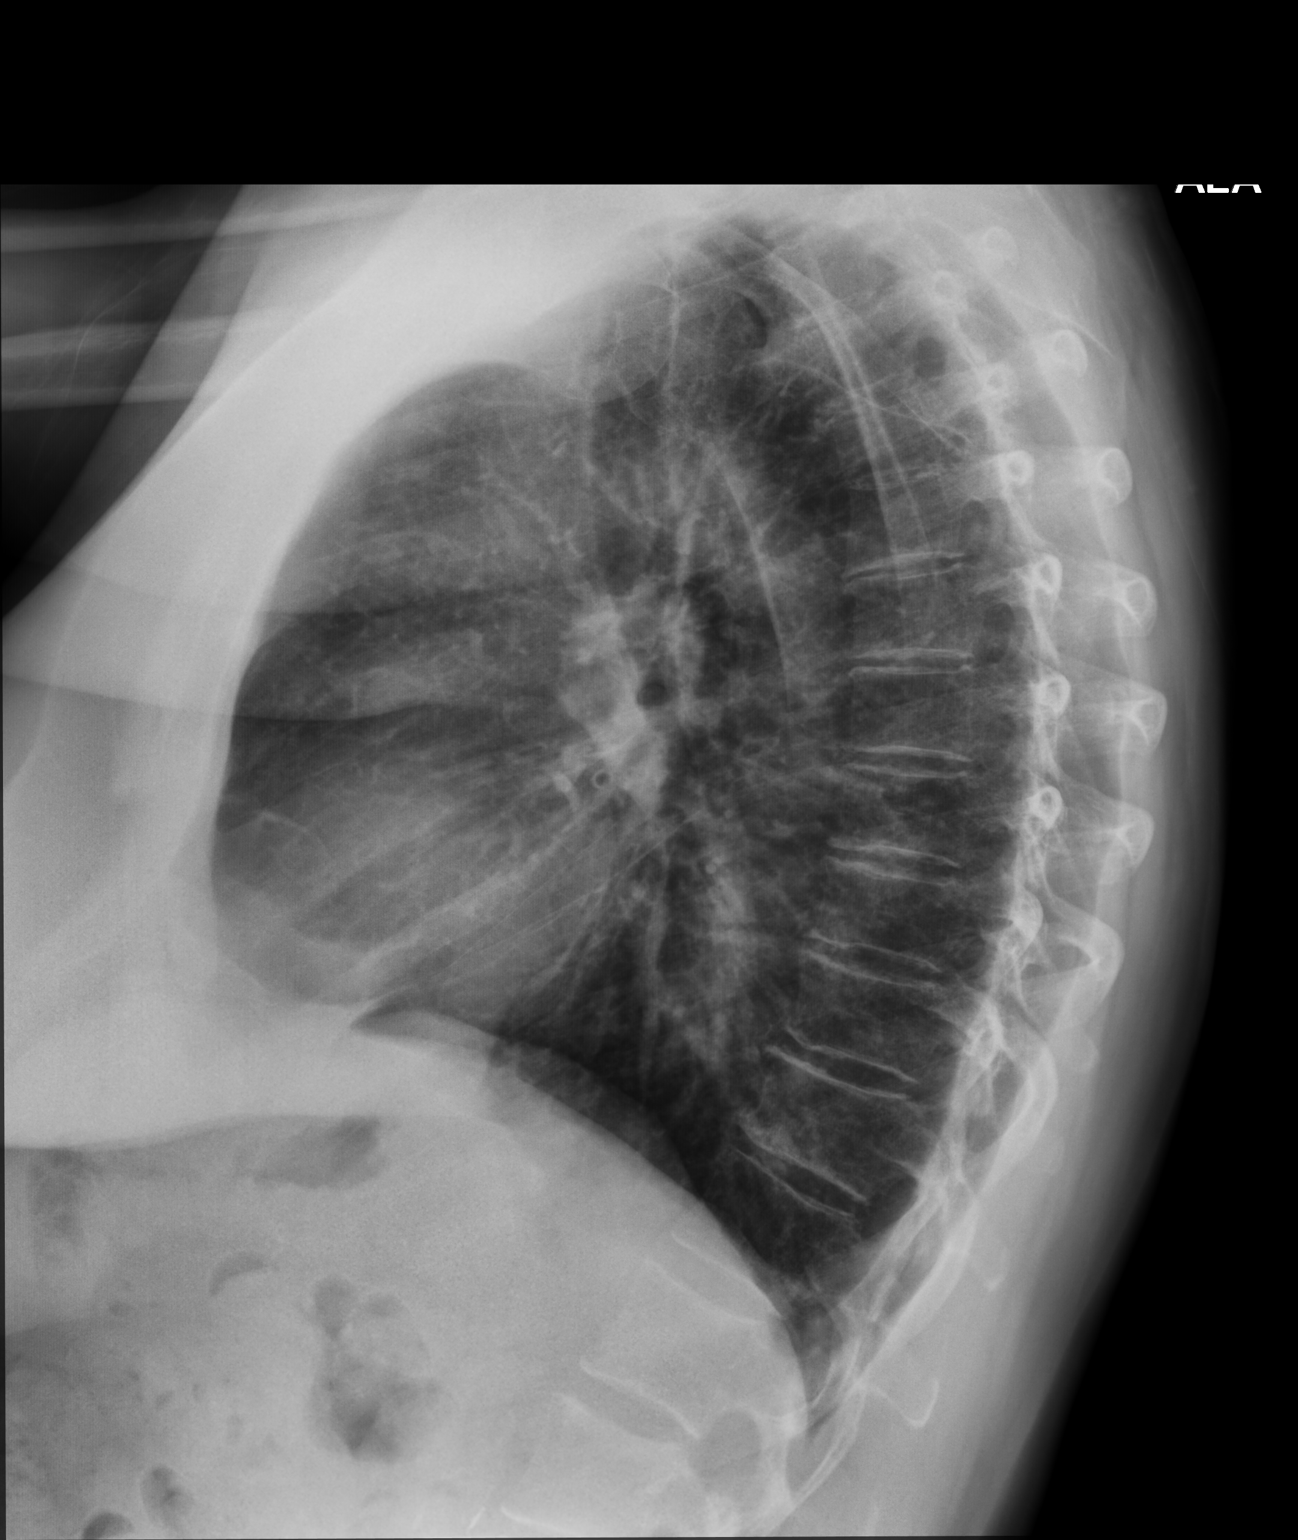

[2 of 2 positions shown; findings below may reference images not displayed]

FINDINGS: Cardiac size is within normal limits. Increase in AP diameter of
chest suggests COPD. There are no signs of alveolar pulmonary edema
or focal pulmonary consolidation. Small linear density at left
cardiophrenic angle may suggest scarring or subsegmental
atelectasis. There is minimal blunting of left lateral CP angle.
There is no pneumothorax.
IMPRESSION: COPD. Linear density in the left lower lung fields may suggest
scarring or subsegmental atelectasis. Blunting of left lateral CP
angle suggests minimal effusion or pleural thickening.

## 2023-07-12 IMAGING — CT CT ANGIO CHEST
2 of 7 series · 17 of 46 positions shown · IV contrast (agent unspecified)
Comparison: Chest x-ray 07/14/2021

CLINICAL DATA: Cough, chest pain

EXAM:
CT ANGIOGRAPHY CHEST WITH CONTRAST
TECHNIQUE: Multidetector CT imaging of the chest was performed using the
standard protocol during bolus administration of intravenous
contrast. Multiplanar CT image reconstructions and MIPs were
obtained to evaluate the vascular anatomy.

[Series 5: pe axial thins · axial · 0.80mm/px · z∈[-341,-72]mm · 14 of 311 slices shown]
[im 21/311  lung]
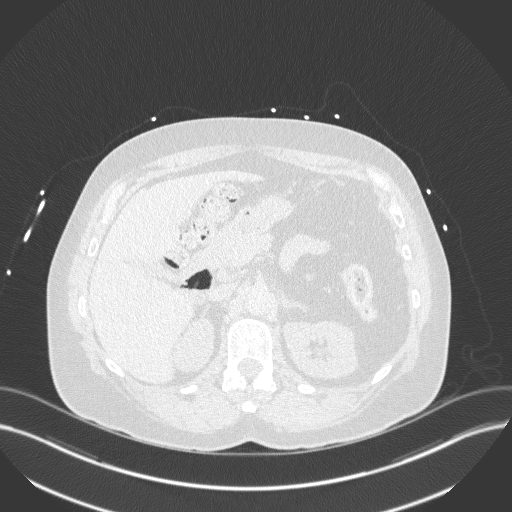
[im 42/311  soft-tissue]
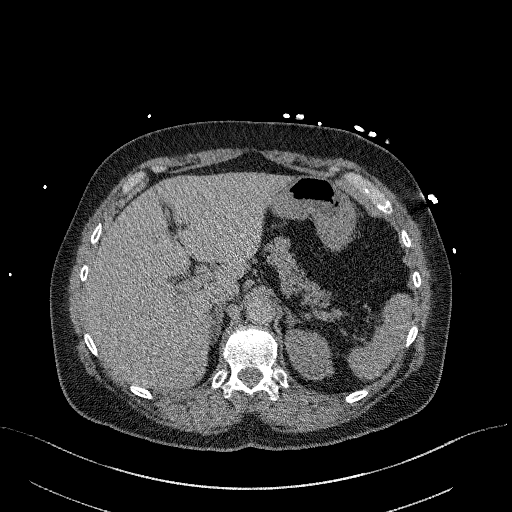
[im 63/311  lung]
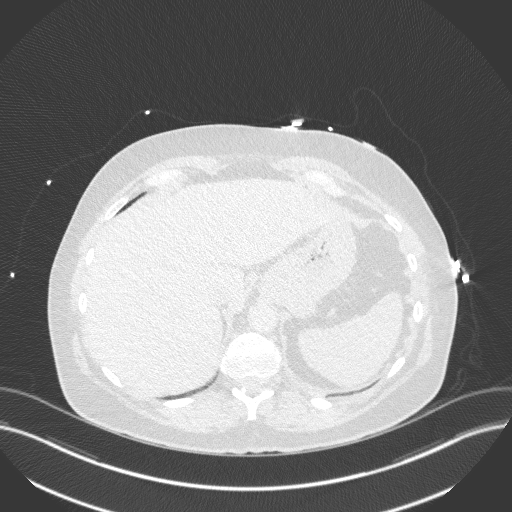
[im 83/311  soft-tissue]
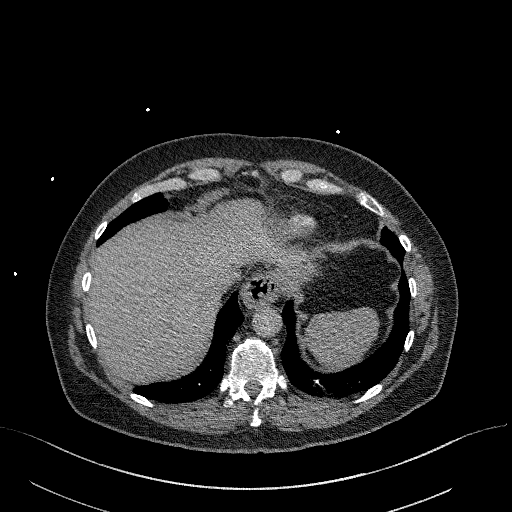
[im 104/311  lung]
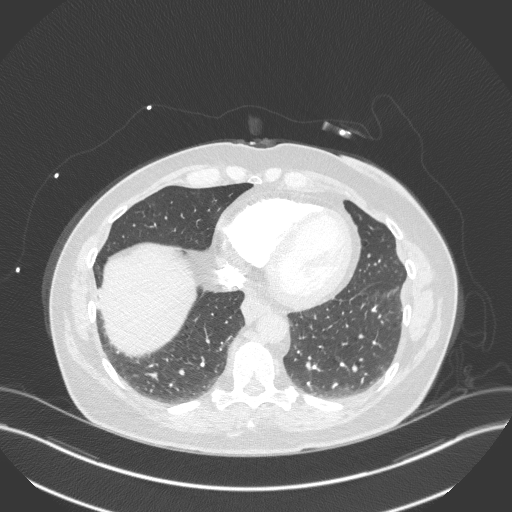
[im 125/311  soft-tissue]
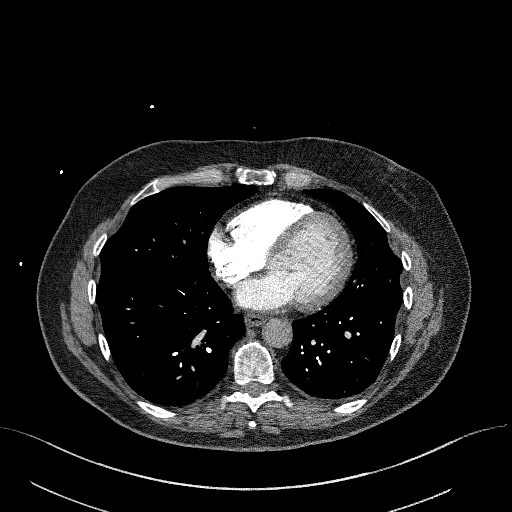
[im 145/311  lung]
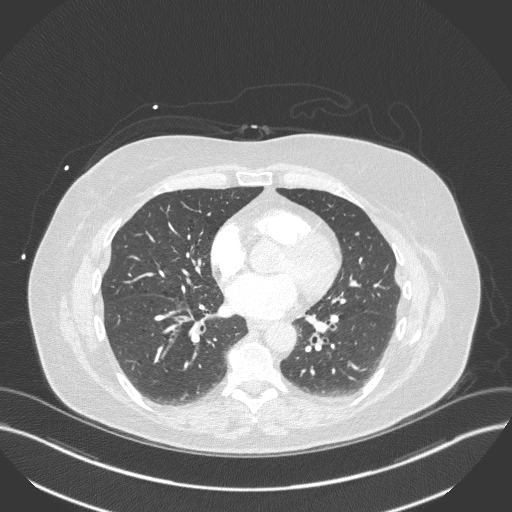
[im 166/311  soft-tissue]
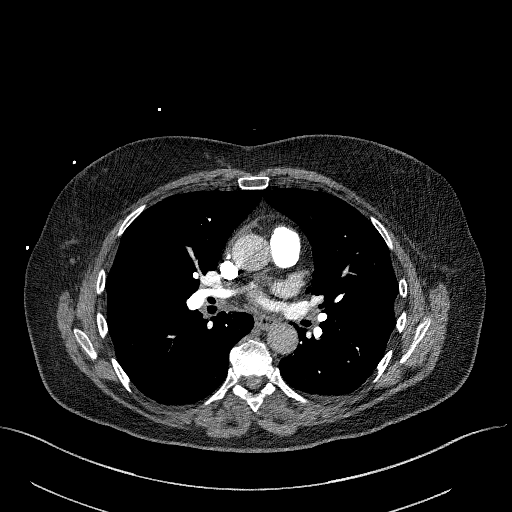
[im 187/311  lung]
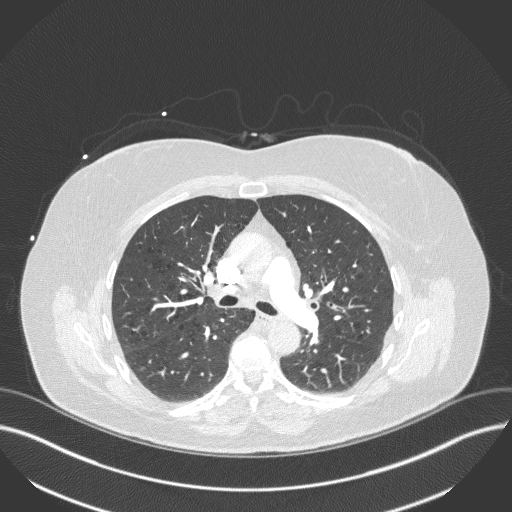
[im 207/311  soft-tissue]
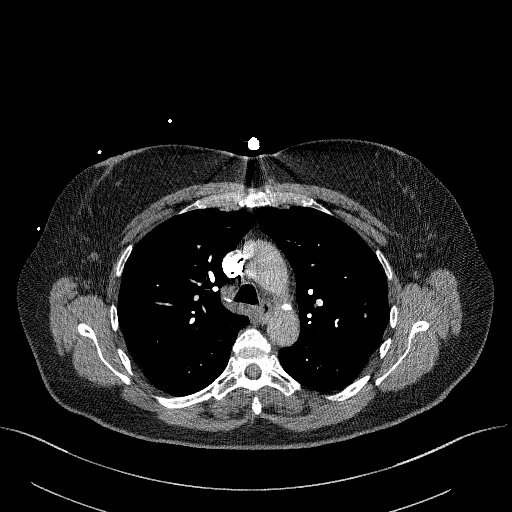
[im 228/311  lung]
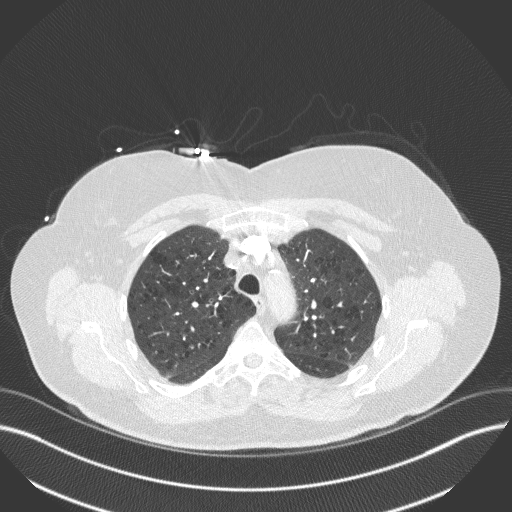
[im 249/311  soft-tissue]
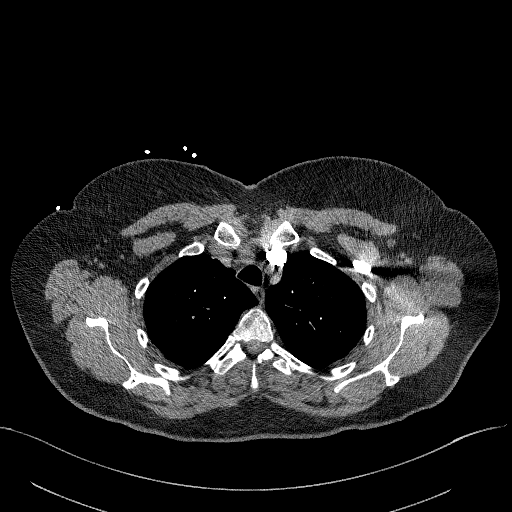
[im 269/311  lung]
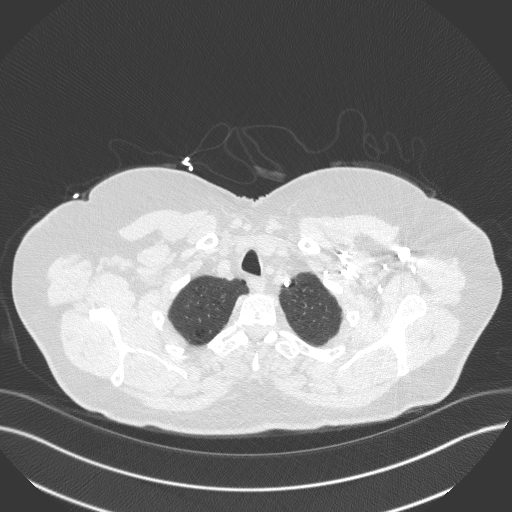
[im 290/311  soft-tissue]
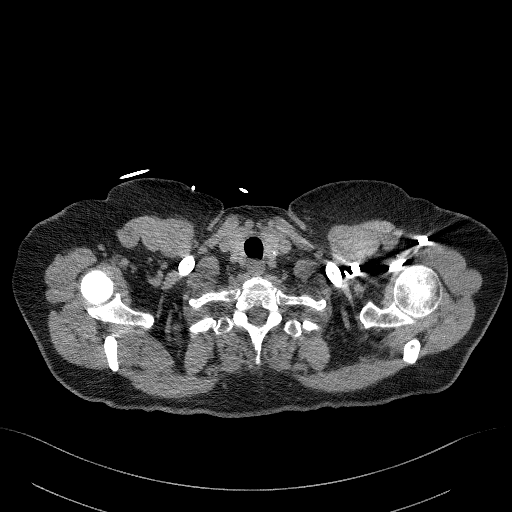

[Series 7: cor soft · coronal · 0.64mm/px · 3 of 137 slices shown]
[im 35/137  soft-tissue]
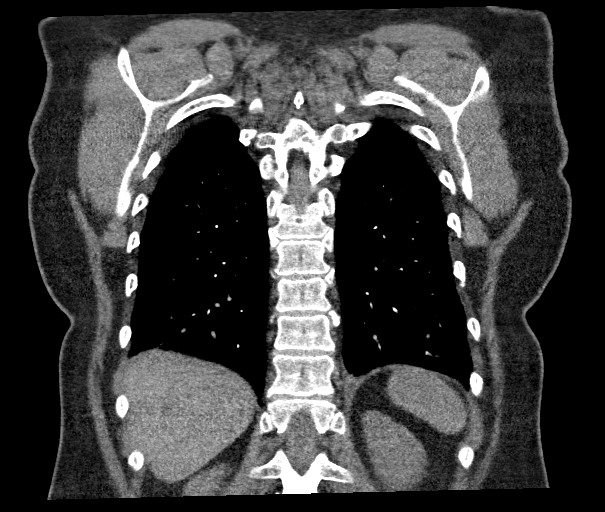
[im 69/137  soft-tissue]
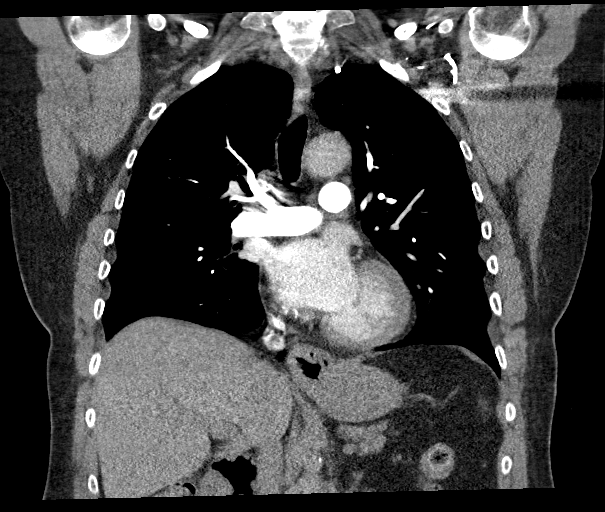
[im 103/137  soft-tissue]
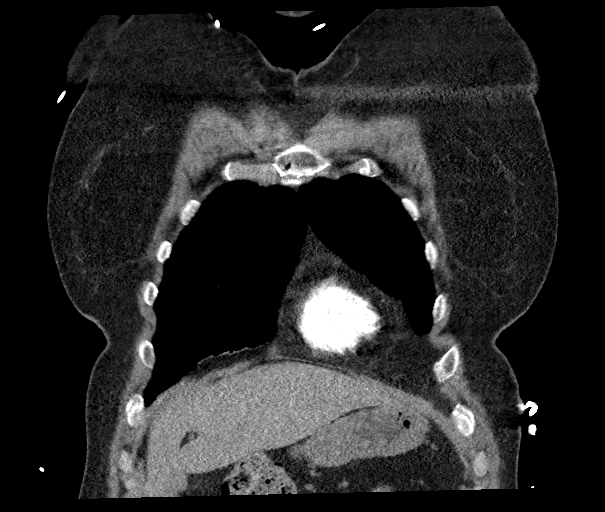

[17 of 46 positions shown; findings below may reference images not displayed]

RADIATION DOSE REDUCTION: This exam was performed according to the
departmental dose-optimization program which includes automated
exposure control, adjustment of the mA and/or kV according to
patient size and/or use of iterative reconstruction technique.

CONTRAST:  61mL OMNIPAQUE IOHEXOL 350 MG/ML SOLN
FINDINGS: Cardiovascular: No pulmonary embolism identified. Main pulmonary
artery is normal caliber. Heart size is normal. No pericardial
effusion visualized. Thoracic aorta is normal in caliber.

Mediastinum/Nodes: No bulky axillary, hilar or mediastinal
lymphadenopathy identified.

Lungs/Pleura: Mild emphysematous changes of the lungs, upper lobe
predominant. No focal consolidation, pleural effusion or
pneumothorax.

Upper Abdomen: No acute process identified.  Small hiatal hernia.

Musculoskeletal: No chest wall abnormality. No acute or significant
osseous findings.

Review of the MIP images confirms the above findings.
IMPRESSION: 1. No pulmonary embolism identified.
2. Mild emphysema.
3. Small hiatal hernia.

## 2023-07-15 ENCOUNTER — Other Ambulatory Visit: Payer: Self-pay | Admitting: Internal Medicine

## 2023-07-15 DIAGNOSIS — E785 Hyperlipidemia, unspecified: Secondary | ICD-10-CM

## 2023-07-26 ENCOUNTER — Other Ambulatory Visit: Payer: Self-pay | Admitting: Gastroenterology

## 2023-07-26 DIAGNOSIS — K219 Gastro-esophageal reflux disease without esophagitis: Secondary | ICD-10-CM

## 2023-07-26 NOTE — Telephone Encounter (Signed)
 Sent MyChart message to patient to see if she is still taking

## 2023-07-28 ENCOUNTER — Other Ambulatory Visit: Payer: Self-pay

## 2023-07-28 DIAGNOSIS — K219 Gastro-esophageal reflux disease without esophagitis: Secondary | ICD-10-CM

## 2023-08-09 ENCOUNTER — Ambulatory Visit
Admission: RE | Admit: 2023-08-09 | Discharge: 2023-08-09 | Disposition: A | Discharge status: Home / Self Care | Payer: 59 | Source: Ambulatory Visit | Attending: Acute Care | Admitting: Acute Care

## 2023-08-09 DIAGNOSIS — Z122 Encounter for screening for malignant neoplasm of respiratory organs: Secondary | ICD-10-CM

## 2023-08-09 DIAGNOSIS — F17210 Nicotine dependence, cigarettes, uncomplicated: Secondary | ICD-10-CM

## 2023-08-09 DIAGNOSIS — Z87891 Personal history of nicotine dependence: Secondary | ICD-10-CM

## 2023-08-24 ENCOUNTER — Ambulatory Visit
Admission: RE | Admit: 2023-08-24 | Discharge: 2023-08-24 | Disposition: A | Discharge status: Home / Self Care | Payer: 59 | Source: Ambulatory Visit | Attending: Internal Medicine | Admitting: Internal Medicine

## 2023-08-24 DIAGNOSIS — Z1231 Encounter for screening mammogram for malignant neoplasm of breast: Secondary | ICD-10-CM

## 2023-08-29 ENCOUNTER — Encounter: Payer: Self-pay | Admitting: Cardiovascular Disease

## 2023-08-29 ENCOUNTER — Encounter: Payer: Self-pay | Admitting: Internal Medicine

## 2023-08-29 DIAGNOSIS — Z1283 Encounter for screening for malignant neoplasm of skin: Secondary | ICD-10-CM

## 2023-08-29 NOTE — Telephone Encounter (Signed)
 Called [place] radiology reading room  Reading room states they are behind and will expedite this for radiologist to review

## 2023-09-04 ENCOUNTER — Telehealth: Payer: Self-pay | Admitting: Acute Care

## 2023-09-04 ENCOUNTER — Telehealth: Payer: Self-pay

## 2023-09-04 NOTE — Telephone Encounter (Signed)
 Spoke with patient and reviewed results. Pt would like Groce, NP to confirm she does not need a sooner scan or PET for 13 mm ground glass nodule. Please advise.    Call report:   IMPRESSION: 1. A few scattered new solid pulmonary nodules measure up to 4.7 mm in the left lower lobe. Lung-RADS 3, probably benign findings. Short-term follow-up in 6 months is recommended with repeat low-dose chest CT without contrast (please use the following order, "CT CHEST LCS NODULE FOLLOW-UP W/O CM"). These results will be called to the ordering clinician or representative by the Radiologist Assistant, and communication documented in the PACS or [employer]. 2. Aortic atherosclerosis (ICD10-I70.0). Coronary artery calcification. 3.  Emphysema (ICD10-J43.9).

## 2023-09-04 NOTE — Telephone Encounter (Signed)
 Call report:\  IMPRESSION: 1. A few scattered new solid pulmonary nodules measure up to 4.7 mm in the left lower lobe. Lung-RADS 3, probably benign findings. Short-term follow-up in 6 months is recommended with repeat low-dose chest CT without contrast (please use the following order, "CT CHEST LCS NODULE FOLLOW-UP W/O CM"). These results will be called to the ordering clinician or representative by the Radiologist Assistant, and communication documented in the PACS or [employer]. 2. Aortic atherosclerosis (ICD10-I70.0). Coronary artery calcification. 3.  Emphysema (ICD10-J43.9).

## 2023-09-08 ENCOUNTER — Other Ambulatory Visit: Payer: Self-pay | Admitting: Internal Medicine

## 2023-09-08 ENCOUNTER — Telehealth: Payer: Self-pay | Admitting: Acute Care

## 2023-09-08 DIAGNOSIS — Z87891 Personal history of nicotine dependence: Secondary | ICD-10-CM

## 2023-09-08 DIAGNOSIS — R911 Solitary pulmonary nodule: Secondary | ICD-10-CM

## 2023-09-08 NOTE — Telephone Encounter (Signed)
 [name], [name], This LR 3 is ok for a 6 month follow up. The 13 mm nodule is non solid. I would not have gone beyond 6 months, but since it was read as a LR3, that should be fine. OK to call and let patient know next scan will be due about 02/09/2024. Thanks so much  Please let her know there was notation of Aortic atherosclerosis,  Coronary artery calcification and Emphysema on the scan. She is on Crestor  and she is followed by cardiology.   Thanks so much

## 2023-09-11 NOTE — Telephone Encounter (Signed)
 Spoke with pt and reviewed CT results per Sarah Groce. PT verbalized understanding and is aware we will contact her closer to 6 months to repeat scan. Results/ plans faxed to PCP. Order placed for 6 mth nodule follow up scan.

## 2023-09-11 NOTE — Telephone Encounter (Signed)
 Left VM for pt to call to discuss CT results.

## 2023-09-13 ENCOUNTER — Ambulatory Visit: Payer: Self-pay | Attending: Cardiovascular Disease | Admitting: Cardiovascular Disease

## 2023-09-13 ENCOUNTER — Encounter: Payer: Self-pay | Admitting: Cardiovascular Disease

## 2023-09-13 ENCOUNTER — Encounter: Payer: Self-pay | Admitting: Internal Medicine

## 2023-09-13 VITALS — BP 108/72 | HR 63 | Ht 67.0 in | Wt 158.0 lb

## 2023-09-13 DIAGNOSIS — E782 Mixed hyperlipidemia: Secondary | ICD-10-CM

## 2023-09-13 DIAGNOSIS — I739 Peripheral vascular disease, unspecified: Secondary | ICD-10-CM

## 2023-09-13 DIAGNOSIS — F411 Generalized anxiety disorder: Secondary | ICD-10-CM

## 2023-09-13 DIAGNOSIS — F17200 Nicotine dependence, unspecified, uncomplicated: Secondary | ICD-10-CM | POA: Diagnosis not present

## 2023-09-13 DIAGNOSIS — R931 Abnormal findings on diagnostic imaging of heart and coronary circulation: Secondary | ICD-10-CM | POA: Diagnosis not present

## 2023-09-13 NOTE — Assessment & Plan Note (Signed)
 Coronary calcium  score performed 10/24/2022 which was 25 the majority of which was in the circumflex coronary artery.  She is active and completely asymptomatic.  LDL goal less than 70 for secondary prevention.

## 2023-09-13 NOTE — Assessment & Plan Note (Signed)
 Ongoing tobacco abuse of 5 cigarettes a day recalcitrant to risk factor modification.  We talked about the importance of smoking cessation.

## 2023-09-13 NOTE — Progress Notes (Signed)
 09/13/2023 [name]   1960-10-05  [id number]  Primary Physician Zilphia Hilt, Charyl Coppersmith, MD Primary Cardiologist: Avanell Leigh MD Lathan Poke, [place], [place]  HPI:  [name] is a 63 y.o.  Aaron Aas thin-appearing married Caucasian female mother of 2 children, grandmother 1 grandchild referred by her PCP, Dr. Ival Marines, to be established because of prior vascular disease and risk factors.  I last saw her in the office 09/13/22.  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  Risk factors include over 20-pack-year tobacco abuse currently smoking 1/2 pack/day with a desire to quit as well as hyperlipidemia.  There is no family history of heart disease.  She is never had a heart attack or stroke.  She denies chest pain or shortness of breath.  She did have severe claudication and underwent aortobifemoral bypass grafting by Dr. Renea Carrion 01/30/2013.  She is followed noninvasively and the vein and vascular specialist office.  Since I saw her in the office a year ago she continues to do well.  I did order a coronary calcium  score in her 10/24/2022 which was 25 with majority which was in the left circumflex.  She is very active, works out with a Systems analyst 3 days a week and hikes and is completely asymptomatic.  Her last lipid profile revealed LDL of 136.  She is on rosuvastatin  20 mg a day.  I prescribed Repatha  which unfortunately she never started because she was "scared of taking it".   Current Meds  Medication Sig   ALPRAZolam  (XANAX ) 0.5 MG tablet Take 1 tablet (0.5 mg total) by mouth at bedtime as needed for sleep.   aspirin  81 MG chewable tablet Chew 1 tablet (81 mg total) by mouth daily.   Aspirin -Caffeine (BAYER BACK & BODY PO) Take by mouth as needed.   Nutritional Supplements (BIFIDO-GENIC GROWTH FACTORS PO) Take 4 tablets by mouth daily.   pantoprazole  (PROTONIX ) 20 MG tablet Take 1 tablet (20 mg total) by mouth daily.    rosuvastatin  (CRESTOR ) 20 MG tablet TAKE 1 TABLET BY MOUTH EVERY DAY   valACYclovir  (VALTREX ) 500 MG tablet TAKE 1 TABLET (500 MG TOTAL) BY MOUTH AS NEEDED.   venlafaxine  XR (EFFEXOR -XR) 75 MG 24 hr capsule TAKE 1 CAPSULE BY MOUTH DAILY WITH BREAKFAST.     Allergies  Allergen Reactions   Lipitor [Atorvastatin ]     Muscle aches   Septra [Sulfamethoxazole-Trimethoprim] Other (See Comments)    Unknown    Simvastatin     Muscle cramps     Social History   Socioeconomic History   Marital status: Married    Spouse name: Not on file   Number of children: 1   Years of education: Not on file   Highest education level: Bachelor's degree (e.g., BA, AB, BS)  Occupational History    Employer: [employer]  Tobacco Use   Smoking status: Every Day    Current packs/day: 0.50    Average packs/day: 0.5 packs/day for 20.0 years (10.0 ttl pk-yrs)    Types: Cigarettes   Smokeless tobacco: Never   Tobacco comments:    Recently filled Chantix  - Awaiting source of radiating leg pain origin  Vaping Use   Vaping status: Never Used  Substance and Sexual Activity   Alcohol use: Yes    Alcohol/week: 2.0 standard drinks of alcohol    Types: 2 Glasses of wine per week    Comment: weekends only  Drug use: No   Sexual activity: Yes    Birth control/protection: Post-menopausal  Other Topics Concern   Not on file  Social History Narrative   Lives at home with husband.     Social Drivers of Corporate investment banker Strain: Low Risk  (03/13/2023)   Overall Financial Resource Strain (CARDIA)    Difficulty of Paying Living Expenses: Not hard at all  Food Insecurity: No Food Insecurity (03/13/2023)   Hunger Vital Sign    Worried About Running Out of Food in the Last Year: Never true    Ran Out of Food in the Last Year: Never true  Transportation Needs: No Transportation Needs (03/13/2023)   PRAPARE - Administrator, Civil Service (Medical): No    Lack of  Transportation (Non-Medical): No  Physical Activity: Sufficiently Active (03/13/2023)   Exercise Vital Sign    Days of Exercise per Week: 5 days    Minutes of Exercise per Session: 50 min  Stress: Stress Concern Present (03/13/2023)   [employer] of Occupational Health - Occupational Stress Questionnaire    Feeling of Stress : Very much  Social Connections: Socially Integrated (03/13/2023)   Social Connection and Isolation Panel [NHANES]    Frequency of Communication with Friends and Family: More than three times a week    Frequency of Social Gatherings with Friends and Family: Once a week    Attends Religious Services: More than 4 times per year    Active Member of [employer] or Organizations: Yes    Attends Engineer, structural: More than 4 times per year    Marital Status: Married  Catering manager Violence: Not on file     Review of Systems: General: negative for chills, fever, night sweats or weight changes.  Cardiovascular: negative for chest pain, dyspnea on exertion, edema, orthopnea, palpitations, paroxysmal nocturnal dyspnea or shortness of breath Dermatological: negative for rash Respiratory: negative for cough or wheezing Urologic: negative for hematuria Abdominal: negative for nausea, vomiting, diarrhea, bright red blood per rectum, melena, or hematemesis Neurologic: negative for visual changes, syncope, or dizziness All other systems reviewed and are otherwise negative except as noted above.    Blood pressure 108/72, pulse 63, height 5\' 7"  (1.702 m), weight 158 lb (71.7 kg), last menstrual period 10/31/2010, SpO2 100%.  General appearance: alert and no distress Neck: no adenopathy, no carotid bruit, no JVD, supple, symmetrical, trachea midline, and thyroid not enlarged, symmetric, no tenderness/mass/nodules Lungs: clear to auscultation bilaterally Heart: regular rate and rhythm, S1, S2 normal, no murmur, click, rub or gallop Extremities: extremities normal,  atraumatic, no cyanosis or edema Pulses: 2+ and symmetric Skin: Skin color, texture, turgor normal. No rashes or lesions Neurologic: Grossly normal  EKG EKG Interpretation Date/Time:  Wednesday September 13 2023 09:38:33 EDT Ventricular Rate:  64 PR Interval:  178 QRS Duration:  76 QT Interval:  390 QTC Calculation: 402 R Axis:   45  Text Interpretation: Normal sinus rhythm Normal ECG When compared with ECG of 16-Jul-2021 09:06, PREVIOUS ECG IS PRESENT Confirmed by Lauro Portal [phone]) on 09/13/2023 9:51:09 AM    ASSESSMENT AND PLAN:   Peripheral vascular disease, unspecified (HCC) History of peripheral arterial disease status post aortobifemoral bypass grafting by Dr. Timm Foot 01/30/2013 currently followed noninvasively by Dr. Zoila Hines.  She is very active and asymptomatic.  Smoker Ongoing tobacco abuse of 5 cigarettes a day recalcitrant to risk factor modification.  We talked about the importance of smoking cessation.  Hyperlipidemia History of  hyperlipidemia on rosuvastatin  with lipid profile performed 02/03/2023 revealing total cholesterol 237, LDL 136 and HDL of 63.  She was prescribed Repatha  which she never started.  Had a long discussion about starting Repatha  with an LDL goal of less than 70.  Elevated coronary artery calcium  score Coronary calcium  score performed 10/24/2022 which was 25 the majority of which was in the circumflex coronary artery.  She is active and completely asymptomatic.  LDL goal less than 70 for secondary prevention.     Avanell Leigh MD FACP,FACC,FAHA, [facility] 09/13/2023 10:02 AM

## 2023-09-13 NOTE — Assessment & Plan Note (Signed)
 History of peripheral arterial disease status post aortobifemoral bypass grafting by Dr. Timm Foot 01/30/2013 currently followed noninvasively by Dr. Zoila Hines.  She is very active and asymptomatic.

## 2023-09-13 NOTE — Patient Instructions (Signed)
 Medication Instructions:  Dr. Katheryne Pane recommends Repatha  (PCSK9). This is an injectable cholesterol medication self-administered once every 14 days. This medication will likely need prior approval with your insurance company, which we will work on. If the medication is not approved initially, we may need to do an appeal with your insurance.   Administer medication in area of fatty tissue such as abdomen, outer thigh, back of upper arm - and rotate site with each injection Store medication in refrigerator until ready to administer - allow to sit at room temp for 30 mins - 1 hour prior to injection Dispose of medication in a SHARPS container - your pharmacy should be able to direct you on this and proper disposal      *If you need a refill on your cardiac medications before your next appointment, please call your pharmacy*  Lab Work: Your physician recommends that you return for lab work in: 3 months for FASTING lipid/liver panel  If you have labs (blood work) drawn today and your tests are completely normal, you will receive your results only by: MyChart Message (if you have MyChart) OR A paper copy in the mail If you have any lab test that is abnormal or we need to change your treatment, we will call you to review the results.    Follow-Up: At [facility], you and your health needs are our priority.  As part of our continuing mission to provide you with exceptional heart care, our providers are all part of one team.  This team includes your primary Cardiologist (physician) and Advanced Practice Providers or APPs (Physician Assistants and Nurse Practitioners) who all work together to provide you with the care you need, when you need it.  Your next appointment:   12 month(s)  Provider:   Lauro Portal, MD     We recommend signing up for the patient portal called "MyChart".  Sign up information is provided on this After Visit Summary.  MyChart is used to connect with patients  for Virtual Visits (Telemedicine).  Patients are able to view lab/test results, encounter notes, upcoming appointments, etc.  Non-urgent messages can be sent to your provider as well.   To learn more about what you can do with MyChart, go to [email or web address].

## 2023-09-13 NOTE — Assessment & Plan Note (Signed)
 History of hyperlipidemia on rosuvastatin  with lipid profile performed 02/03/2023 revealing total cholesterol 237, LDL 136 and HDL of 63.  She was prescribed Repatha  which she never started.  Had a long discussion about starting Repatha  with an LDL goal of less than 70.

## 2023-09-14 MED ORDER — ALPRAZOLAM 0.5 MG PO TABS: 0.5000 mg | ORAL_TABLET | Freq: Every evening | ORAL | 0 refills | Status: DC | PRN

## 2023-09-26 ENCOUNTER — Encounter: Payer: Self-pay | Admitting: Pharmacist

## 2023-11-11 ENCOUNTER — Encounter: Payer: Self-pay | Admitting: Cardiovascular Disease

## 2023-11-23 ENCOUNTER — Telehealth: Payer: Self-pay | Admitting: Pharmacist

## 2023-11-23 NOTE — Telephone Encounter (Signed)
 Called and spoke with patient.  Last Repatha  injection was 2 weeks ago. Had muscle pain with Repatha  and discontinued. Is trying to exercise and eat better. Is leaving for a week vacation this weekend but will update lipid panel when she gets back.

## 2023-12-07 ENCOUNTER — Other Ambulatory Visit: Payer: Self-pay | Admitting: Internal Medicine

## 2023-12-12 ENCOUNTER — Ambulatory Visit (INDEPENDENT_AMBULATORY_CARE_PROVIDER_SITE_OTHER): Payer: Self-pay | Admitting: Obstetrics and Gynecology

## 2023-12-12 ENCOUNTER — Telehealth: Payer: Self-pay | Admitting: Obstetrics and Gynecology

## 2023-12-12 ENCOUNTER — Encounter: Payer: Self-pay | Admitting: Obstetrics and Gynecology

## 2023-12-12 VITALS — BP 130/80 | HR 88 | Ht 65.75 in | Wt 158.6 lb

## 2023-12-12 DIAGNOSIS — Z1331 Encounter for screening for depression: Secondary | ICD-10-CM | POA: Diagnosis not present

## 2023-12-12 DIAGNOSIS — Z01419 Encounter for gynecological examination (general) (routine) without abnormal findings: Secondary | ICD-10-CM

## 2023-12-12 DIAGNOSIS — Z8719 Personal history of other diseases of the digestive system: Secondary | ICD-10-CM

## 2023-12-12 DIAGNOSIS — B009 Herpesviral infection, unspecified: Secondary | ICD-10-CM | POA: Diagnosis not present

## 2023-12-12 MED ORDER — VALACYCLOVIR HCL 500 MG PO TABS
ORAL_TABLET | ORAL | 3 refills | Status: AC
Start: 2023-12-12 — End: ?

## 2023-12-12 NOTE — Progress Notes (Signed)
 62 y.o. G4P0011 Married Caucasian female here for annual exam.    Coconut oil working well for personal hydration.   No vaginal bleeding.    Hx AIN I.  Status post transanal excision with Dr. Teresa 11/01/21.  Had post op visit 11/24/21 and no further follow up noted.   Still smoking.    PCP: [name], Tully GRADE, MD   Patient's last menstrual period was 10/31/2010.           Sexually active: Yes.    The current method of family planning is post menopausal status.    Menopausal hormone therapy:  n/a Exercising: Yes.    Goes to see a Systems analyst 4 times a week and goes hiking and walks dog.  Smoker:  yes  OB History  Gravida Para Term Preterm AB Living  2 1   1 1   SAB IAB Ectopic Multiple Live Births    1  1    # Outcome Date GA Lbr Len/2nd Weight Sex Type Anes PTL Lv  2 Ectopic           1 Para              HEALTH MAINTENANCE: Last 2 paps:  08/20/20 neg HR HPV neg History of abnormal Pap or positive HPV:  yes, had conization at age 11 yo.  Paps normal following this per patient.  Mammogram:   08/24/23 Breast density Cat B, BIRADS Cat 1 neg  Colonoscopy:  08/11/21 Bone Density:  03/22/23  Result  osteopenic/low bone mass.  FRAX 9.8%/1.9%  Immunization History  Administered Date(s) Administered   Influenza,inj,Quad PF,6+ Mos 02/07/2019, 04/20/2021, 04/26/2022   Janssen (J&J) SARS-COV-2 Vaccination 08/15/2019, 04/06/2020   Tdap 08/04/2020   Zoster Recombinant(Shingrix ) 08/04/2020, 11/24/2021      reports that she has been smoking cigarettes. She has a 10 pack-year smoking history. She has never used smokeless tobacco. She reports current alcohol use of about 2.0 standard drinks of alcohol per week. She reports that she does not use drugs.  Past Medical History:  Diagnosis Date   Anxiety    takes Xanax  prn    Asthma    Back pain    DDD   Cancer (HCC) 2023   ankle - basal cell carcinoma   Constipation    COVID-19 04/25/2021   flu-like symptoms    Depression    takes Effexor  daily   Fracture of tibia and fibula 1986   left - trauma   Genital herpes    GERD (gastroesophageal reflux disease)    takes Omeprazole daily   Headache    hx of migraines, sinus headaches   Hemorrhoids    History of abnormal cervical Pap smear    age 65--hx conization-paps normal since   History of bronchitis    last time 3months ago   History of colon polyps    History of hiatal hernia 07/2021   5cm per 08/11/21 EGD   History of migraine    last one 69yrs ago--with aura   HSV-1 infection    Hyperlipidemia    not on meds at present and Dr.McKenzie will review after surgery   Insomnia    Osteopenia 2024   hip and spine   Peripheral vascular disease (HCC)    PONV (postoperative nausea and vomiting)    PUD (peptic ulcer disease)    Weakness    bil legs, from pvd, resolved after aortobifemoral bypass in 2014   Wears glasses     Past  Surgical History:  Procedure Laterality Date   AIN I  11/05/2021   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   AIN I   ANKLE SURGERY Left 1986   with 3 screws and 2 pens   AORTA - BILATERAL FEMORAL ARTERY BYPASS GRAFT N/A 01/30/2013   Procedure: AORTA BIFEMORAL BYPASS GRAFT;  Surgeon: Lynwood JONETTA Collum, MD;  Location: [facility] OR;  Service: Vascular;  Laterality: N/A;   BREAST SURGERY  02/2020   breast reduction    CARDIOVASCULAR STRESS TEST  01/21/2013   low risk stress test, no ischemia   CERVICAL CONE BIOPSY     2008   COLONOSCOPY  11/11/2017   polyps, diverticulosis   COLONOSCOPY  08/11/2021   colon polyps, anal papillae (AIN)   CORONARY ANGIOPLASTY  01/2013   Aorta bi-femoral bypass   ECTOPIC PREGNANCY SURGERY     x 2   ESOPHAGOGASTRODUODENOSCOPY  08/11/2021   5 cm hiatal hernia   left fallopian tube removed     25 years ago as of 10/26/21   MASS EXCISION N/A 11/01/2021   Procedure: EXCISION OF ANAL CANAL LESION UNDER ANOSCOPY;  Surgeon: Teresa Lonni HERO, MD;  Location: [facility];  Service: General;   Laterality: N/A;   RECTAL EXAM UNDER ANESTHESIA N/A 11/01/2021   Procedure: ANORECTAL EXAM UNDER ANESTHESIA;  Surgeon: Teresa Lonni HERO, MD;  Location: [facility];  Service: General;  Laterality: N/A;   wisdom teeth extracted      when patient was 14 years    Current Outpatient Medications  Medication Sig Dispense Refill   ALPRAZolam  (XANAX ) 0.5 MG tablet Take 1 tablet (0.5 mg total) by mouth at bedtime as needed for sleep. 30 tablet 0   aspirin  81 MG chewable tablet Chew 1 tablet (81 mg total) by mouth daily.     Aspirin -Caffeine (BAYER BACK & BODY PO) Take by mouth as needed.     Nutritional Supplements (BIFIDO-GENIC GROWTH FACTORS PO) Take 4 tablets by mouth daily.     Omega-3 Fatty Acids (FISH OIL) 1000 MG CAPS Take by mouth.     pantoprazole  (PROTONIX ) 20 MG tablet Take 1 tablet (20 mg total) by mouth daily.     rosuvastatin  (CRESTOR ) 20 MG tablet TAKE 1 TABLET BY MOUTH EVERY DAY 90 tablet 1   venlafaxine  XR (EFFEXOR -XR) 75 MG 24 hr capsule TAKE 1 CAPSULE BY MOUTH DAILY WITH BREAKFAST. 90 capsule 1   Vitamin D , Ergocalciferol , (DRISDOL) 1.25 MG (50000 UNIT) CAPS capsule Take 50,000 Units by mouth every 7 (seven) days.     valACYclovir  (VALTREX ) 500 MG tablet Take 4 tablets (2000 mg) by mouth twice daily for one day. 30 tablet 3   No current facility-administered medications for this visit.    ALLERGIES: Lipitor [atorvastatin ], Repatha  [evolocumab ], Septra [sulfamethoxazole-trimethoprim], and Simvastatin  Family History  Problem Relation Age of Onset   Hyperlipidemia Mother    Asthma Maternal Grandmother    Heart attack Maternal Grandfather    Heart disease Maternal Grandfather    Hypertension Maternal Grandfather    Hypertension Paternal Grandmother    Breast cancer Other    Colon cancer Neg Hx    Esophageal cancer Neg Hx    Stomach cancer Neg Hx    Rectal cancer Neg Hx     Review of Systems  All other systems reviewed and are negative.   PHYSICAL  EXAM:  BP 130/80   Pulse 88   Ht 5' 5.75 (1.67 m)   Wt 158 lb 9.6 oz (  71.9 kg)   LMP 10/31/2010   SpO2 96%   BMI 25.80 kg/m     General appearance: alert, cooperative and appears stated age Head: normocephalic, without obvious abnormality, atraumatic Neck: no adenopathy, supple, symmetrical, trachea midline and thyroid normal to inspection and palpation Lungs: clear to auscultation bilaterally Breasts: consistent with bilateral reduction, no masses or tenderness, No nipple retraction or dimpling, No nipple discharge or bleeding, No axillary adenopathy Heart: regular rate and rhythm Abdomen: soft, non-tender; no masses, no organomegaly Extremities: extremities normal, atraumatic, no cyanosis or edema Skin: skin color, texture, turgor normal. No rashes or lesions Lymph nodes: cervical, supraclavicular, and axillary nodes normal. Neurologic: grossly normal  Pelvic: External genitalia:  no lesions              No abnormal inguinal nodes palpated.              Urethra:  normal appearing urethra with no masses, tenderness or lesions              Bartholins and Skenes: normal                 Vagina: normal appearing vagina with normal color and discharge, no lesions              Cervix: no lesions              Pap taken: no Bimanual Exam:  Uterus:  normal size, contour, position, consistency, mobility, non-tender              Adnexa: no mass, fullness, tenderness              Rectal exam: yes.  Confirms.              Anus:  normal sphincter tone, no lesions  Chaperone was present for exam:  Geni CROME, CMA  ASSESSMENT: Well woman visit with gynecologic exam. Hx conization for moderate dysplasia. Hx AIN I. Hx ectopic pregnancy.  Status post left fallopian tube removal on pathology report 03/06/00. Status post bilateral breast reduction.  FH breast cancer.  Maternal great grandmother.   Tobacco use.  Hx HSV 1.  Uses Valtrex  prn. Status post aorto-bifem bypass.  Hx gastritis with  esophagitis.  PHQ-2-9: 0  PLAN: Mammogram screening discussed. Self breast awareness reviewed. Pap and HRV collected:  no.  Guidelines for Calcium , Vitamin D , regular exercise program including cardiovascular and weight bearing exercise. Medication refills:  Valtrex  2000 mg po twice daily for 1 day prn. Labs with PCP and vascular team.  Referral back to Dr. Teresa for follow up of AIN.  Follow up:  yearly and prn.    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   Patient will be contacted to recommend she return for a pap and HR HPV testing this year with me.

## 2023-12-12 NOTE — Telephone Encounter (Signed)
 Please contact patient to review her conization history.   After her visit was completed today, I found an old pathology report in Epic from 2008 with Dr. Austin for cervical conization and endometrial curettage.   This showed CIN II of the cervix.   At her visit, she thought that her conization was done at age 63.   I am not clear if she has had more than one conization, but due to the 2008 procedure, she does need cervical cancer screening.  Please schedule a brief visit with me to do this this year.

## 2023-12-12 NOTE — Patient Instructions (Signed)

## 2023-12-13 NOTE — Telephone Encounter (Signed)
 Spoke with patient, advised per Dr. Nikki. Patient agreeable to schedule, OV scheduled for 02/05/24 at 1030.   Routing to provider for final review. Patient is agreeable to disposition. Will close encounter.

## 2023-12-19 ENCOUNTER — Encounter: Payer: Self-pay | Admitting: Internal Medicine

## 2023-12-19 DIAGNOSIS — F411 Generalized anxiety disorder: Secondary | ICD-10-CM

## 2023-12-19 MED ORDER — ALPRAZOLAM 0.5 MG PO TABS: 0.5000 mg | ORAL_TABLET | Freq: Every evening | ORAL | 0 refills | Status: DC | PRN

## 2024-01-10 ENCOUNTER — Encounter: Payer: Self-pay | Admitting: Internal Medicine

## 2024-01-10 ENCOUNTER — Ambulatory Visit: Admitting: Internal Medicine

## 2024-01-10 VITALS — BP 130/88 | HR 91 | Temp 98.1°F | Wt 160.6 lb

## 2024-01-10 DIAGNOSIS — F339 Major depressive disorder, recurrent, unspecified: Secondary | ICD-10-CM | POA: Diagnosis not present

## 2024-01-10 DIAGNOSIS — R300 Dysuria: Secondary | ICD-10-CM

## 2024-01-10 LAB — POC URINALSYSI DIPSTICK (AUTOMATED)
Bilirubin, UA: NEGATIVE
Blood, UA: NEGATIVE
Glucose, UA: NEGATIVE
Ketones, UA: NEGATIVE
Leukocytes, UA: NEGATIVE
Nitrite, UA: NEGATIVE
Protein, UA: NEGATIVE
Spec Grav, UA: 1.01 (ref 1.010–1.025)
Urobilinogen, UA: 0.2 U/dL
pH, UA: 6 (ref 5.0–8.0)

## 2024-01-10 MED ORDER — SERTRALINE HCL 25 MG PO TABS: 25.0000 mg | ORAL_TABLET | Freq: Every day | ORAL | 1 refills | Status: AC

## 2024-01-10 NOTE — Progress Notes (Addendum)
 Established Patient Office Visit     CC/Reason for Visit: Worsening emotional state  HPI: [name] is a 63 y.o. female who is coming in today for the above mentioned reasons. Past Medical History is significant for: Depression and anxiety.  Lately she has been having a lot of social stressors.  Feels like her depression has gotten worse.  She has sorted to having a drink every day to cope.  Has some mild dysuria and increased frequency.   Past Medical/Surgical History: Past Medical History:  Diagnosis Date   Anxiety    takes Xanax  prn    Asthma    Back pain    DDD   Cancer (HCC) 2023   ankle - basal cell carcinoma   Constipation    COVID-19 04/25/2021   flu-like symptoms   Depression    takes Effexor  daily   Fracture of tibia and fibula 1986   left - trauma   Genital herpes    GERD (gastroesophageal reflux disease)    takes Omeprazole daily   Headache    hx of migraines, sinus headaches   Hemorrhoids    History of abnormal cervical Pap smear    age 69--hx conization-paps normal since   History of bronchitis    last time 3months ago   History of colon polyps    History of hiatal hernia 07/2021   5cm per 08/11/21 EGD   History of migraine    last one 31yrs ago--with aura   HSV-1 infection    Hyperlipidemia    not on meds at present and Dr.McKenzie will review after surgery   Insomnia    Osteopenia 2024   hip and spine   Peripheral vascular disease (HCC)    PONV (postoperative nausea and vomiting)    PUD (peptic ulcer disease)    Weakness    bil legs, from pvd, resolved after aortobifemoral bypass in 2014   Wears glasses     Past Surgical History:  Procedure Laterality Date   AIN I  11/05/2021   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   COLONOSCOPY  11/11/2017   polyps, diverticulosis   COLONOSCOPY  08/11/2021   colon polyps, anal papillae (AIN)   CORONARY ANGIOPLASTY  01/2013   Aorta bi-femoral bypass   ECTOPIC PREGNANCY SURGERY     x 2   ESOPHAGOGASTRODUODENOSCOPY  08/11/2021   5 cm hiatal hernia   left fallopian tube removed     25 years ago as of 10/26/21   MASS EXCISION N/A 11/01/2021   Procedure: EXCISION OF ANAL CANAL LESION UNDER ANOSCOPY;  Surgeon: Teresa Lonni HERO, MD;  Location: [facility];  Service: General;  Laterality: N/A;   RECTAL EXAM UNDER ANESTHESIA N/A 11/01/2021   Procedure: ANORECTAL EXAM UNDER ANESTHESIA;  Surgeon: Teresa Lonni HERO, MD;  Location: [facility];  Service: General;  Laterality: N/A;   wisdom teeth extracted      when patient was  14 years    Social History:  reports that she has been smoking cigarettes. She has a 10 pack-year smoking history. She has never used smokeless tobacco. She reports current alcohol use of about 2.0 standard drinks of alcohol per week. She reports that she does not use drugs.  Allergies: Allergies  Allergen Reactions   Lipitor [Atorvastatin ]     Muscle aches   Repatha  [Evolocumab ]     Muscle cramps     Septra [Sulfamethoxazole-Trimethoprim] Other (See Comments)    Unknown    Simvastatin     Muscle cramps     Family History:  Family History  Problem Relation Age of Onset   Hyperlipidemia Mother    Asthma Maternal Grandmother    Heart attack Maternal Grandfather    Heart disease Maternal Grandfather    Hypertension Maternal Grandfather    Hypertension Paternal Grandmother    Breast cancer Other    Colon cancer Neg Hx    Esophageal cancer Neg Hx    Stomach cancer Neg Hx    Rectal cancer Neg Hx       Current Outpatient Medications:    ALPRAZolam  (XANAX ) 0.5 MG tablet, Take 1 tablet (0.5 mg total) by mouth at bedtime as needed for sleep., Disp: 30 tablet, Rfl: 0   Amino Acids (AMINO ACID PO), Take by mouth., Disp: , Rfl:    NON FORMULARY, , Disp: , Rfl:    Nutritional Supplements (BIFIDO-GENIC GROWTH FACTORS PO), Take 4 tablets by mouth daily., Disp: , Rfl:    Omega-3 Fatty Acids (FISH OIL) 1000 MG CAPS, Take by mouth., Disp: , Rfl:    ORAL ELECTROLYTES PO, Take by mouth., Disp: , Rfl:    OVER THE COUNTER MEDICATION, Dietary supplement, Disp: , Rfl:    OVER THE COUNTER MEDICATION, Antioxidant blend, Disp: , Rfl:    pantoprazole  (PROTONIX ) 20 MG tablet, Take 1 tablet (20 mg total) by mouth daily., Disp: , Rfl:    rosuvastatin  (CRESTOR ) 20 MG tablet, TAKE 1 TABLET BY MOUTH EVERY DAY, Disp: 90 tablet, Rfl: 1   sertraline  (ZOLOFT ) 25 MG tablet, Take 1 tablet (25 mg total) by mouth daily., Disp: 90 tablet, Rfl: 1   valACYclovir  (VALTREX ) 500 MG tablet, Take 4 tablets (2000 mg) by mouth twice daily for one day., Disp: 30 tablet, Rfl: 3   Vitamin D , Ergocalciferol , (DRISDOL) 1.25 MG (50000 UNIT) CAPS capsule, Take 50,000 Units by mouth every 7 (seven) days., Disp: , Rfl:   Review of Systems:  Negative unless indicated in HPI.   Physical Exam: Vitals:   01/10/24 1312  BP: 130/88  Pulse: 91  Temp: 98.1 F (36.7 C)  TempSrc: Oral  SpO2: 98%  Weight: 160 lb 9.6 oz (72.8 kg)    Body mass index is 26.12 kg/m.  Flowsheet Row Office Visit from 01/10/2024 in [facility] [place]  PHQ-9 Total Score 13      Impression and Plan:  Depression, recurrent (HCC) -     Sertraline  HCl; Take 1 tablet (25 mg total) by mouth daily.  Dispense: 90 tablet; Refill: 1  Dysuria -     POCT Urinalysis Dipstick (Automated)   -Very depressed.  Given information on how to schedule CBT.  Start sertraline  25 mg daily.  She has been on venlafaxine  for years and will start  weaning by taking every other day for a week and then every third day for a week.  -In office urine dipstick is negative.  Time spent:31 minutes reviewing  chart, interviewing and examining patient and formulating plan of care.     [name], MD [facility] at [facility]

## 2024-01-11 ENCOUNTER — Encounter: Payer: Self-pay | Admitting: Internal Medicine

## 2024-01-13 LAB — LIPID PANEL
Chol/HDL Ratio: 3.7 ratio (ref 0.0–4.4)
Cholesterol, Total: 253 mg/dL — ABNORMAL HIGH (ref 100–199)
HDL: 69 mg/dL (ref >39)
LDL Chol Calc (NIH): 137 mg/dL — ABNORMAL HIGH (ref 0–99)
Triglycerides: 265 mg/dL — ABNORMAL HIGH (ref 0–149)
VLDL Cholesterol Cal: 47 mg/dL — ABNORMAL HIGH (ref 5–40)

## 2024-01-14 ENCOUNTER — Ambulatory Visit: Payer: Self-pay | Admitting: Cardiovascular Disease

## 2024-01-16 ENCOUNTER — Encounter: Payer: Self-pay | Admitting: Internal Medicine

## 2024-01-17 LAB — LAB REPORT - SCANNED
A1c: 5.7
EGFR: 79

## 2024-01-18 ENCOUNTER — Other Ambulatory Visit: Payer: Self-pay | Admitting: Internal Medicine

## 2024-01-18 DIAGNOSIS — E785 Hyperlipidemia, unspecified: Secondary | ICD-10-CM

## 2024-01-23 ENCOUNTER — Telehealth: Payer: Self-pay | Admitting: Gastroenterology

## 2024-01-23 ENCOUNTER — Telehealth: Payer: Self-pay | Admitting: *Deleted

## 2024-01-23 NOTE — Telephone Encounter (Signed)
Spoke with patient and an appointment scheduled 

## 2024-01-23 NOTE — Telephone Encounter (Signed)
 Dottie I agree with all of your recommendations. Recommend she see us  in the office to discuss longer term plan, if you can help coordinate an office visit, and double up protonix  to BID in the interim as you suggest. Thanks

## 2024-01-23 NOTE — Telephone Encounter (Addendum)
 Patient calls with complaints of urinary frequency and lower abdominal cramping x 5 weeks which has progressively worsened. States she went to her PCP last week and was told her urine test was negative for UTI. Denies any constipation or change in bowel habits.  Patient also complains of 6 months terrible indigestion which is worse with lying down and sitting. She states that she has some upper abdominal distention intermittently with discomfort as well. Patient states that she does follow antireflux diet, though not closely. She has been taking pantoprazole  20 mg once daily. No nausea/vomiting.   Patient is advised on antireflux measures//diet and asked to follow these closely. Asked patient to remain upright for at least 2-3 hours after eating and to elevated HOB 6 inches. Also asked that she increase pantoprazole  to 20 mg twice daily x 2 weeks to hopefully get her symptoms back under control.  Hx large HH, esophageal ulcer, GERD, large duodenal diverticulum.  In regards to urinary frequency without dysuria, I have asked that she have this followed again by her PCP as she may need additional genitourinary workup. She verbalizes understanding.  Dr Leigh, please advise of any additional recommendations.

## 2024-01-23 NOTE — Telephone Encounter (Signed)
 Spoke to patient to advise that Dr Leigh is in agreement with previous recommendations. She has already scheduled to see B.McMichael, PA-C on 03/21/24 (soonest availability) in follow up. She has also been added to the appointment wait list.

## 2024-01-23 NOTE — Telephone Encounter (Signed)
 Copied from CRM #[id number]. Topic: Appointments - Scheduling Inquiry for Clinic >> Jan 23, 2024  3:37 PM Rea C wrote: Reason for CRM: Patient called in wanting to speak specifically with Nurse Vernell and schedule an appointment. Patient is experiencing lower abdominal stomach pain that is getting worse. She said she was tested for UTI, which came back negative, but pain is getting worse. Patient declined offer to speak with Nurse Triage because she wants to speak specifically with Vernell or be scheduled.   [phone]

## 2024-01-23 NOTE — Telephone Encounter (Signed)
 Received a call from patient stating she is experiencing abnormal abdominal pain and is requesting nurse fu call for advice and maybe sooner scheduling. Please review and advise  Thank you

## 2024-01-24 ENCOUNTER — Encounter: Payer: Self-pay | Admitting: Internal Medicine

## 2024-01-25 ENCOUNTER — Encounter: Payer: Self-pay | Admitting: Internal Medicine

## 2024-01-25 ENCOUNTER — Ambulatory Visit: Admitting: Internal Medicine

## 2024-01-25 ENCOUNTER — Ambulatory Visit (HOSPITAL_BASED_OUTPATIENT_CLINIC_OR_DEPARTMENT_OTHER)
Admission: RE | Admit: 2024-01-25 | Discharge: 2024-01-25 | Disposition: A | Discharge status: Home / Self Care | Source: Ambulatory Visit | Attending: Internal Medicine | Admitting: Internal Medicine

## 2024-01-25 VITALS — BP 110/80 | HR 85 | Temp 98.1°F | Wt 159.4 lb

## 2024-01-25 DIAGNOSIS — F411 Generalized anxiety disorder: Secondary | ICD-10-CM

## 2024-01-25 DIAGNOSIS — R1030 Lower abdominal pain, unspecified: Secondary | ICD-10-CM

## 2024-01-25 DIAGNOSIS — R140 Abdominal distension (gaseous): Secondary | ICD-10-CM | POA: Diagnosis not present

## 2024-01-25 DIAGNOSIS — F339 Major depressive disorder, recurrent, unspecified: Secondary | ICD-10-CM | POA: Diagnosis not present

## 2024-01-25 LAB — POC URINALSYSI DIPSTICK (AUTOMATED)
Bilirubin, UA: NEGATIVE
Blood, UA: NEGATIVE
Glucose, UA: NEGATIVE
Ketones, UA: NEGATIVE
Leukocytes, UA: NEGATIVE
Nitrite, UA: NEGATIVE
Protein, UA: NEGATIVE
Spec Grav, UA: 1.01 (ref 1.010–1.025)
Urobilinogen, UA: 0.2 U/dL
pH, UA: 6 (ref 5.0–8.0)

## 2024-01-25 MED ORDER — ALPRAZOLAM 0.5 MG PO TABS: 0.5000 mg | ORAL_TABLET | Freq: Every evening | ORAL | 0 refills | Status: DC | PRN

## 2024-01-25 MED ORDER — IOHEXOL 300 MG/ML  SOLN
100.0000 mL | Freq: Once | INTRAMUSCULAR | Status: AC | PRN
Start: 2024-01-25 — End: 2024-01-25
  Administered 2024-01-25: 100 mL via INTRAVENOUS

## 2024-01-25 NOTE — Progress Notes (Signed)
 Established Patient Office Visit     CC/Reason for Visit: Continued lower abdominal pain, follow-up depression, anxiety  HPI: [name] is a 63 y.o. female who is coming in today for the above mentioned reasons.  This is now her second visit for lower abdominal pain.  She feels distended, feels like it is too full.  She states she has 1-2 bowel movements a day.  Does not feel like she has urinary retention, in fact has been urinating quite frequently.  Has been very tired.  She is concerned she might have another UTI.  At last visit urine was clear.  She was also very depressed.  We started sertraline  and started weaning her Effexor .  She feels much improved from this aspect.  She had recent labs that I have reviewed that showed normal hepatic and renal function, elevated cholesterol.   Past Medical/Surgical History: Past Medical History:  Diagnosis Date   Anxiety    takes Xanax  prn    Asthma    Back pain    DDD   Cancer (HCC) 2023   ankle - basal cell carcinoma   Constipation    COVID-19 04/25/2021   flu-like symptoms   Depression    takes Effexor  daily   Fracture of tibia and fibula 1986   left - trauma   Genital herpes    GERD (gastroesophageal reflux disease)    takes Omeprazole daily   Headache    hx of migraines, sinus headaches   Hemorrhoids    History of abnormal cervical Pap smear    age 22--hx conization-paps normal since   History of bronchitis    last time 3months ago   History of colon polyps    History of hiatal hernia 07/2021   5cm per 08/11/21 EGD   History of migraine    last one 2yrs ago--with aura   HSV-1 infection    Hyperlipidemia    not on meds at present and Dr.McKenzie will review after surgery   Insomnia    Osteopenia 2024   hip and spine   Peripheral vascular disease (HCC)    PONV (postoperative nausea and vomiting)    PUD (peptic ulcer disease)    Weakness    bil legs, from pvd, resolved after aortobifemoral bypass in  2014   Wears glasses     Past Surgical History:  Procedure Laterality Date   AIN I  11/05/2021   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   COLONOSCOPY  11/11/2017   polyps, diverticulosis   COLONOSCOPY  08/11/2021   colon polyps, anal papillae (AIN)   CORONARY ANGIOPLASTY  01/2013   Aorta bi-femoral bypass   ECTOPIC PREGNANCY SURGERY     x 2   ESOPHAGOGASTRODUODENOSCOPY  08/11/2021   5 cm hiatal hernia   left fallopian tube removed     25 years ago as of 10/26/21   MASS EXCISION N/A 11/01/2021   Procedure: EXCISION OF ANAL CANAL LESION UNDER  ANOSCOPY;  Surgeon: Teresa Lonni HERO, MD;  Location: [facility];  Service: General;  Laterality: N/A;   RECTAL EXAM UNDER ANESTHESIA N/A 11/01/2021   Procedure: ANORECTAL EXAM UNDER ANESTHESIA;  Surgeon: Teresa Lonni HERO, MD;  Location: [facility];  Service: General;  Laterality: N/A;   wisdom teeth extracted      when patient was 14 years    Social History:  reports that she has been smoking cigarettes. She has a 10 pack-year smoking history. She has never used smokeless tobacco. She reports current alcohol use of about 2.0 standard drinks of alcohol per week. She reports that she does not use drugs.  Allergies: Allergies  Allergen Reactions   Lipitor [Atorvastatin ]     Muscle aches   Repatha  [Evolocumab ]     Muscle cramps     Septra [Sulfamethoxazole-Trimethoprim] Other (See Comments)    Unknown    Simvastatin     Muscle cramps     Family History:  Family History  Problem Relation  Age of Onset   Hyperlipidemia Mother    Asthma Maternal Grandmother    Heart attack Maternal Grandfather    Heart disease Maternal Grandfather    Hypertension Maternal Grandfather    Hypertension Paternal Grandmother    Breast cancer Other    Colon cancer Neg Hx    Esophageal cancer Neg Hx    Stomach cancer Neg Hx    Rectal cancer Neg Hx      Current Outpatient Medications:    ALPRAZolam  (XANAX ) 0.5 MG tablet, Take 1 tablet (0.5 mg total) by mouth at bedtime as needed for sleep., Disp: 30 tablet, Rfl: 0   Amino Acids (AMINO ACID PO), Take by mouth., Disp: , Rfl:    NON FORMULARY, , Disp: , Rfl:    Nutritional Supplements (BIFIDO-GENIC GROWTH FACTORS PO), Take 4 tablets by mouth daily., Disp: , Rfl:    Omega-3 Fatty Acids (FISH OIL) 1000 MG CAPS, Take by mouth., Disp: , Rfl:    ORAL ELECTROLYTES PO, Take by mouth., Disp: , Rfl:    OVER THE COUNTER MEDICATION, Dietary supplement, Disp: , Rfl:    OVER THE COUNTER MEDICATION, Antioxidant blend, Disp: , Rfl:    pantoprazole  (PROTONIX ) 20 MG tablet, Take 1 tablet (20 mg total) by mouth daily., Disp: , Rfl:    rosuvastatin  (CRESTOR ) 20 MG tablet, TAKE 1 TABLET BY MOUTH EVERY DAY, Disp: 90 tablet, Rfl: 1   sertraline  (ZOLOFT ) 25 MG tablet, Take 1 tablet (25 mg total) by mouth daily., Disp: 90 tablet, Rfl: 1   valACYclovir  (VALTREX ) 500 MG tablet, Take 4 tablets (2000 mg) by mouth twice daily for one day., Disp: 30 tablet, Rfl: 3   Vitamin D , Ergocalciferol , (DRISDOL) 1.25 MG (50000 UNIT) CAPS capsule, Take 50,000 Units by mouth every 7 (seven) days., Disp: , Rfl:   Review of Systems:  Negative unless indicated in HPI.   Physical Exam: Vitals:   01/25/24 1401  BP: 110/80  Pulse: 85  Temp: 98.1 F (36.7 C)  TempSrc: Oral  SpO2: 99%  Weight: 159 lb 6.4 oz (72.3 kg)    Body mass index is 25.93 kg/m.   Physical Exam Abdominal:     General: Abdomen is protuberant. Bowel sounds are decreased. There is distension.     Palpations:  Abdomen is soft.     Tenderness: There is abdominal tenderness in the right lower quadrant, suprapubic area and left lower quadrant.      Impression and Plan:  Lower abdominal pain -     POCT Urinalysis Dipstick (Automated) -     CT ABDOMEN PELVIS W CONTRAST; Future  Abdominal distention -     CT ABDOMEN PELVIS W CONTRAST; Future  Depression, recurrent (HCC)   - In office urine dipstick is again negative. - As she is visibly distended on exam today, I will order CT scan.  My main concerns would be ovarian pathology, bladder pathology.  Also to note she has had in the past an aortobifemoral bypass.  Time spent:32 minutes reviewing chart, interviewing and examining patient and formulating plan of care.     [name], MD [facility] at [facility]

## 2024-01-28 ENCOUNTER — Encounter: Payer: Self-pay | Admitting: Internal Medicine

## 2024-01-28 ENCOUNTER — Ambulatory Visit: Payer: Self-pay | Admitting: Internal Medicine

## 2024-01-28 DIAGNOSIS — R93429 Abnormal radiologic findings on diagnostic imaging of unspecified kidney: Secondary | ICD-10-CM

## 2024-01-29 ENCOUNTER — Encounter: Payer: Self-pay | Admitting: Acute Care

## 2024-02-05 ENCOUNTER — Ambulatory Visit: Admitting: Obstetrics and Gynecology

## 2024-02-05 ENCOUNTER — Other Ambulatory Visit (HOSPITAL_COMMUNITY)
Admission: RE | Admit: 2024-02-05 | Discharge: 2024-02-05 | Disposition: A | Source: Ambulatory Visit | Attending: Obstetrics and Gynecology | Admitting: Obstetrics and Gynecology

## 2024-02-05 ENCOUNTER — Encounter: Payer: Self-pay | Admitting: Obstetrics and Gynecology

## 2024-02-05 VITALS — BP 124/78 | HR 74 | Wt 160.2 lb

## 2024-02-05 DIAGNOSIS — D582 Other hemoglobinopathies: Secondary | ICD-10-CM

## 2024-02-05 DIAGNOSIS — Z9889 Other specified postprocedural states: Secondary | ICD-10-CM | POA: Diagnosis not present

## 2024-02-05 DIAGNOSIS — Z124 Encounter for screening for malignant neoplasm of cervix: Secondary | ICD-10-CM | POA: Insufficient documentation

## 2024-02-05 NOTE — Progress Notes (Signed)
 GYNECOLOGY  VISIT   HPI: 63 y.o.   Married  Caucasian female   G2P0011 with Patient's last menstrual period was 10/31/2010.   here for: Pap smear. Pt also states that she has been having some lower pelvic pain. She saw her pcp and done a CT scan which showed Kidney stones. It got better however yesterday she had very bad pelvic. She did bring in labs that were drawn and her uric acid levels are elevated.    Urinalysis unremarkable 01/10/24.   CT showed 8 mm calculus.  Normal uterus and adnexa.   Pain is now better.  She will see urology.    Uric acid 8.1. Hgb 16.2, and patient is asking potential cause.  Hx AIN I.  Status post transanal excision with Dr. Teresa 11/01/21.  Hx cervical conization for CIN II in 2008 on chart review.  She sees a functional doctor.  GYNECOLOGIC HISTORY: Patient's last menstrual period was 10/31/2010. Contraception:  PMP Menopausal hormone therapy:  n/a Last 2 paps:  08/20/20 neg HR HPV neg  History of abnormal Pap or positive HPV:  yes, had conization at age 51 yo.  Paps normal following this per patient.  Mammogram:  08/24/23 Breast density Cat B, BIRADS Cat 1 neg         OB History     Gravida  2   Para  1   Term      Preterm      AB  1   Living  1      SAB      IAB      Ectopic  1   Multiple      Live Births  1              Patient Active Problem List   Diagnosis Date Noted   Elevated coronary artery calcium  score 09/13/2023   Dog bite of nose 08/30/2022   IGT (impaired glucose tolerance) 04/27/2022   Hyperlipidemia 04/27/2022   Smoker 04/22/2021   S/P bilateral breast reduction 02/25/2020   Symptomatic mammary hypertrophy 05/03/2019   Atherosclerosis of native artery of extremity with intermittent claudication (HCC) 02/26/2013   Peripheral vascular disease, unspecified (HCC) 01/15/2013    Past Medical History:  Diagnosis Date   Anxiety    takes Xanax  prn    Asthma    Back pain    DDD   Cancer (HCC) 2023    ankle - basal cell carcinoma   Constipation    COVID-19 04/25/2021   flu-like symptoms   Depression    takes Effexor  daily   Fracture of tibia and fibula 1986   left - trauma   Genital herpes    GERD (gastroesophageal reflux disease)    takes Omeprazole daily   Headache    hx of migraines, sinus headaches   Hemorrhoids    History of abnormal cervical Pap smear    age 63--hx conization-paps normal since   History of bronchitis    last time 3months ago   History of colon polyps    History of hiatal hernia 07/2021   5cm per 08/11/21 EGD   History of migraine    last one 30yrs ago--with aura   HSV-1 infection    Hyperlipidemia    not on meds at present and Dr.McKenzie will review after surgery   Insomnia    Osteopenia 2024   hip and spine   Peripheral vascular disease (HCC)    PONV (postoperative nausea and vomiting)  PUD (peptic ulcer disease)    Weakness    bil legs, from pvd, resolved after aortobifemoral bypass in 2014   Wears glasses     Past Surgical History:  Procedure Laterality Date   AIN I  11/05/2021   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   COLONOSCOPY  11/11/2017   polyps, diverticulosis   COLONOSCOPY  08/11/2021   colon polyps, anal papillae (AIN)   CORONARY ANGIOPLASTY  01/2013   Aorta bi-femoral bypass   ECTOPIC PREGNANCY SURGERY     x 2   ESOPHAGOGASTRODUODENOSCOPY  08/11/2021   5 cm hiatal hernia   left fallopian tube removed     25 years ago as of 10/26/21   MASS EXCISION N/A 11/01/2021   Procedure: EXCISION OF ANAL CANAL LESION  UNDER ANOSCOPY;  Surgeon: Teresa Lonni HERO, MD;  Location: [facility];  Service: General;  Laterality: N/A;   RECTAL EXAM UNDER ANESTHESIA N/A 11/01/2021   Procedure: ANORECTAL EXAM UNDER ANESTHESIA;  Surgeon: Teresa Lonni HERO, MD;  Location: [facility];  Service: General;  Laterality: N/A;   wisdom teeth extracted      when patient was 14 years    Current Outpatient Medications  Medication Sig Dispense Refill   ALPRAZolam  (XANAX ) 0.5 MG tablet Take 1 tablet (0.5 mg total) by mouth at bedtime as needed for sleep. 30 tablet 0   Amino Acids (AMINO ACID PO) Take by mouth.     NON FORMULARY      Nutritional Supplements (BIFIDO-GENIC GROWTH FACTORS PO) Take 4 tablets by mouth daily.     Omega-3 Fatty Acids (FISH OIL) 1000 MG CAPS Take by mouth.     ORAL ELECTROLYTES PO Take by mouth.     OVER THE COUNTER MEDICATION Dietary supplement     OVER THE COUNTER MEDICATION Antioxidant blend     pantoprazole  (PROTONIX ) 20 MG tablet Take 1 tablet (20 mg total) by mouth daily.     rosuvastatin  (CRESTOR ) 20 MG tablet TAKE 1 TABLET BY MOUTH EVERY DAY 90 tablet 1   sertraline  (ZOLOFT ) 25 MG tablet Take 1 tablet (25 mg total) by mouth daily. 90 tablet 1   valACYclovir  (VALTREX ) 500 MG tablet Take 4 tablets (2000 mg) by mouth twice daily for one day. 30 tablet 3   Vitamin D , Ergocalciferol , (DRISDOL) 1.25 MG (50000 UNIT) CAPS capsule Take 50,000 Units by mouth every 7 (seven) days.     No current facility-administered medications for this visit.     ALLERGIES: Lipitor [atorvastatin ], Repatha  [evolocumab ], Septra [sulfamethoxazole-trimethoprim], and Simvastatin  Family History  Problem Relation Age of Onset   Hyperlipidemia Mother    Asthma Maternal Grandmother    Heart attack Maternal Grandfather    Heart disease Maternal Grandfather    Hypertension Maternal Grandfather    Hypertension Paternal Grandmother    Breast cancer Other    Colon cancer Neg Hx     Esophageal cancer Neg Hx    Stomach cancer  Neg Hx    Rectal cancer Neg Hx     Social History   Socioeconomic History   Marital status: Married    Spouse name: Not on file   Number of children: 1   Years of education: Not on file   Highest education level: Bachelor's degree (e.g., BA, AB, BS)  Occupational History    Employer: [employer]  Tobacco Use   Smoking status: Every Day    Current packs/day: 0.50    Average packs/day: 0.5 packs/day for 20.0 years (10.0 ttl pk-yrs)    Types: Cigarettes   Smokeless tobacco: Never   Tobacco comments:    Recently filled Chantix  - Awaiting source of radiating leg pain origin  Vaping Use   Vaping status: Never Used  Substance and Sexual Activity   Alcohol use: Yes    Alcohol/week: 2.0 standard drinks of alcohol    Types: 2 Glasses of wine per week    Comment: weekends only   Drug use: No   Sexual activity: Yes    Birth control/protection: Post-menopausal  Other Topics Concern   Not on file  Social History Narrative   Lives at home with husband.     Social Drivers of Corporate investment banker Strain: Low Risk  (03/13/2023)   Overall Financial Resource Strain (CARDIA)    Difficulty of Paying Living Expenses: Not hard at all  Food Insecurity: No Food Insecurity (03/13/2023)   Hunger Vital Sign    Worried About Running Out of Food in the Last Year: Never true    Ran Out of Food in the Last Year: Never true  Transportation Needs: No Transportation Needs (03/13/2023)   PRAPARE - Administrator, Civil Service (Medical): No    Lack of Transportation (Non-Medical): No  Physical Activity: Sufficiently Active (03/13/2023)   Exercise Vital Sign    Days of Exercise per Week: 5 days    Minutes of Exercise per Session: 50 min  Stress: Stress Concern Present (03/13/2023)   [employer] of Occupational Health - Occupational Stress Questionnaire    Feeling of Stress : Very much  Social Connections: Socially  Integrated (03/13/2023)   Social Connection and Isolation Panel    Frequency of Communication with Friends and Family: More than three times a week    Frequency of Social Gatherings with Friends and Family: Once a week    Attends Religious Services: More than 4 times per year    Active Member of [employer] or Organizations: Yes    Attends Engineer, structural: More than 4 times per year    Marital Status: Married  Catering manager Violence: Not on file    Review of Systems  See HPI>  PHYSICAL EXAMINATION:   BP 124/78   Pulse 74   Wt 160 lb 3.2 oz (72.7 kg)   LMP 10/31/2010   SpO2 96%   BMI 26.06 kg/m     General appearance: alert, cooperative and appears stated age   Pelvic: External genitalia:  no lesions              Urethra:  normal appearing urethra with no masses, tenderness or lesions              Bartholins and Skenes: normal                 Vagina: normal appearing vagina with normal color and discharge, no lesions  Cervix: no lesions.  Consistent with conization.                 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 Pelvic pain.  Dx of kidney stones.  Hx aortobifemoral bypass.  Elevated Hgb.  Smoker.   PLAN:  Pap and HR HPV collected.  Will do cervical cancer screening every 3 years for 25 years following her procedure.  Follow up with urology.  We reviewed potential cause for elevated Hgb including smoking.  FU for annual exam and prn.    22 min  total time was spent for this patient encounter, including preparation, face-to-face counseling with the patient, coordination of care, and documentation of the encounter.

## 2024-02-07 ENCOUNTER — Encounter: Payer: Self-pay | Admitting: Internal Medicine

## 2024-02-07 LAB — CYTOLOGY - PAP
Adequacy: ABSENT
Comment: NEGATIVE
Diagnosis: NEGATIVE
High risk HPV: NEGATIVE

## 2024-02-08 ENCOUNTER — Ambulatory Visit: Payer: Self-pay | Admitting: Obstetrics and Gynecology

## 2024-02-09 ENCOUNTER — Ambulatory Visit
Admission: RE | Admit: 2024-02-09 | Discharge: 2024-02-09 | Disposition: A | Discharge status: Home / Self Care | Source: Ambulatory Visit | Attending: Acute Care | Admitting: Acute Care

## 2024-02-09 DIAGNOSIS — Z87891 Personal history of nicotine dependence: Secondary | ICD-10-CM

## 2024-02-09 DIAGNOSIS — R911 Solitary pulmonary nodule: Secondary | ICD-10-CM

## 2024-02-14 ENCOUNTER — Other Ambulatory Visit: Payer: Self-pay | Admitting: Acute Care

## 2024-02-14 DIAGNOSIS — Z87891 Personal history of nicotine dependence: Secondary | ICD-10-CM

## 2024-02-14 DIAGNOSIS — F17210 Nicotine dependence, cigarettes, uncomplicated: Secondary | ICD-10-CM

## 2024-02-14 DIAGNOSIS — Z122 Encounter for screening for malignant neoplasm of respiratory organs: Secondary | ICD-10-CM

## 2024-03-10 ENCOUNTER — Encounter: Payer: Self-pay | Admitting: Internal Medicine

## 2024-03-10 DIAGNOSIS — F411 Generalized anxiety disorder: Secondary | ICD-10-CM

## 2024-03-11 ENCOUNTER — Encounter: Payer: Self-pay | Admitting: Internal Medicine

## 2024-03-11 MED ORDER — ALPRAZOLAM 0.5 MG PO TABS: 0.5000 mg | ORAL_TABLET | Freq: Every evening | ORAL | 0 refills | Status: DC | PRN

## 2024-03-12 ENCOUNTER — Ambulatory Visit: Payer: Self-pay

## 2024-03-12 NOTE — Telephone Encounter (Signed)
 FYI Only or Action Required?: FYI only for provider.  Patient was last seen in primary care on 01/25/2024 by [name], Tully GRADE, MD.  Called Nurse Triage reporting Animal Bite.  Symptoms began x 2 days ago.  Interventions attempted: Ice/heat application.  Symptoms are: unchanged.  Triage Disposition: See HCP Within 4 Hours (Or PCP Triage)  Patient/caregiver understands and will follow disposition?: Yes  Appt. Scheduled for 10/29        Copied from CRM #[id number]. Topic: Clinical - Red Word Triage >> Mar 12, 2024  3:42 PM Rea BROCKS wrote: Red Word that prompted transfer to Nurse Triage: Dog bite;  Patient's dog bit her. Reason for Disposition  [1] Puncture wound or small cut AND [2] on face  (Exception: Puncture  from small pet such as gerbil, mouse, hamster, puppy.)  Answer Assessment - Initial Assessment Questions 1. APPEARANCE What does it look like?  (e.g., abrasion, bruise, puncture)      Puncture wounds, redness, and swelling noted  2. SIZE: How big is the bite? (e.g., inches, cm; or compare to size of coin, pea, grape, ping pong ball)      1 inch per patient   3. LOCATION: Where is the bite located?      Face, nose area   4. ONSET: When did the bite happen? (e.g., minutes, hours ago)      Last Sunday night   5. ANIMAL: What type of animal caused the bite?   Patients pet dog        6. RABIES VACCINE: For dog or cat bites, ask: Do you know if the pet is vaccinated against rabies?  (e.g., yes, no, overdue for rabies shot, unknown)     Yes, up to date on vaccines    7. CIRCUMSTANCES: Tell me how this happened.      Patients dog was in pain and bit the patient    Patient has a headache as well and is taking aspirin  for the pain. Appt. Scheduled for 10/29  Protocols used: Animal Bite-A-AH

## 2024-03-12 NOTE — Telephone Encounter (Signed)
 Noted

## 2024-03-13 ENCOUNTER — Encounter: Payer: Self-pay | Admitting: Family Medicine

## 2024-03-13 ENCOUNTER — Ambulatory Visit: Admitting: Family Medicine

## 2024-03-13 VITALS — BP 124/78 | HR 64 | Temp 97.7°F | Wt 163.4 lb

## 2024-03-13 DIAGNOSIS — S0185XA Open bite of other part of head, initial encounter: Secondary | ICD-10-CM

## 2024-03-13 DIAGNOSIS — W540XXA Bitten by dog, initial encounter: Secondary | ICD-10-CM | POA: Diagnosis not present

## 2024-03-13 MED ORDER — AMOXICILLIN-POT CLAVULANATE 875-125 MG PO TABS: 1.0000 | ORAL_TABLET | Freq: Two times a day (BID) | ORAL | 0 refills | Status: AC

## 2024-03-13 NOTE — Progress Notes (Signed)
    Subjective:    Patient ID: [name], female    DOB: 1960-10-26, 63 y.o.   MRN: [id number]  HPI Here for a dog bite to her nose that occurred 2 days ago. Her 35 year old dog is having back problems and is likely in pain. Two days ago she leaned forward to kiss the dog and he nipped her nose. She has been applying ice to the area. She has only mild discomfort.    Review of Systems  Constitutional: Negative.   Respiratory: Negative.    Cardiovascular: Negative.   Skin:  Positive for wound.       Objective:   Physical Exam Constitutional:      Appearance: Normal appearance.  HENT:     Nose:     Comments: There are 2 small puncture wounds on the bridge of her nose, one on each side. These are tender, but there is no erythema or warmth  Cardiovascular:     Rate and Rhythm: Normal rate.     Pulses: Normal pulses.     Heart sounds: Normal heart sounds.  Pulmonary:     Effort: Pulmonary effort is normal.     Breath sounds: Normal breath sounds.  Neurological:     Mental Status: She is alert.           Assessment & Plan:  Dog bite. We will cover with 7 days of Augmentin . Recheck as needed.  Garnette Olmsted, MD

## 2024-03-20 NOTE — Progress Notes (Unsigned)
 Chief Complaint: Primary GI MD:  HPI:  *** is a  ***  who was referred to me by [name], Estel* for a complaint of *** .     Discussed the use of AI scribe software for clinical note transcription with the patient, who gave verbal consent to proceed.  History of Present Illness      PREVIOUS GI WORKUP   Colonoscopy with Dr. [name] on 11/10/2017. 7 small sessile polyps.  6 in the rectosigmoid colon and 1 in the proximal ascending colon.  2 6-7 mm polyps, 1 in the rectosigmoid colon and 1 in the proximal descending colon, few small and large mouth diverticula in sigmoid colon and terminal ileum appeared normal.  Small internal hemorrhoids.  At that time repeat recommended in 3 to 5 years for surveillance based on pathology.  Pathology showed hyperplastic polyps in the rectosigmoid.  Tubular adenoma in the descending and ascending colon.  Repeat recommended in 3 years.  This was due 11/10/2020.   EGD 08/11/21: - A 5 cm hiatal hernia was present. - There was evidence of healed esophagitis / ulceration at the GEJ, suspect likely source of prior bleeding that has since healed. The exam of the esophagus was otherwise normal. - The entire examined stomach was normal. - A large diverticulum was found in the second portion of the duodenum. - The exam of the duodenum was otherwise normal.   Colonoscopy 08/11/21: The perianal and digital rectal examinations were normal. - A diminutive polyp was found in the cecum. The polyp was sessile. The polyp was removed with a cold snare. Resection and retrieval were complete. - There was a medium-sized lipoma, in the ascending colon. - Two sessile polyps were found in the ascending colon. The polyps were 3 mm in size. These polyps were removed with a cold snare. Resection and retrieval were complete. - A 4 mm polyp was found in the hepatic flexure. The polyp was sessile. The polyp was removed with a cold snare. Resection and retrieval were  complete. - Multiple small-mouthed diverticula were found in the entire colon. - Anal papilla(e) were hypertrophied. Biopsies were taken with a cold forceps for histology, rule out AIN. - Internal hemorrhoids were found during retroflexion. - The exam was otherwise without abnormality.   1. Surgical [P], colon, cecum, ascending, hepatic flexure, polyp (4) TUBULAR ADENOMAS AND A HYPERPLASTIC POLYP. NEGATIVE FOR HIGH-GRADE DYSPLASIA. 2. Surgical [P], colon, rectum ANAL PAPILLA WITH LOW-GRADE DYSPLASIA (AIN 1). NEGATIVE FOR HIGH-GRADE DYSPLASIA AND MALIGNANCY.    Past Medical History:  Diagnosis Date   Anxiety    takes Xanax  prn    Asthma    Back pain    DDD   Cancer (HCC) 2023   ankle - basal cell carcinoma   Constipation    COVID-19 04/25/2021   flu-like symptoms   Depression    takes Effexor  daily   Fracture of tibia and fibula 1986   left - trauma   Genital herpes    GERD (gastroesophageal reflux disease)    takes Omeprazole daily   Headache    hx of migraines, sinus headaches   Hemorrhoids    History of abnormal cervical Pap smear    age 53--hx conization-paps normal since   History of bronchitis    last time 3months ago   History of colon polyps    History of hiatal hernia 07/2021   5cm per 08/11/21 EGD   History of migraine    last one 74yrs ago--with aura  HSV-1 infection    Hyperlipidemia    not on meds at present and Dr.McKenzie will review after surgery   Insomnia    Osteopenia 2024   hip and spine   Peripheral vascular disease    PONV (postoperative nausea and vomiting)    PUD (peptic ulcer disease)    Weakness    bil legs, from pvd, resolved after aortobifemoral bypass in 2014   Wears glasses     Past Surgical History:  Procedure Laterality Date   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   COLONOSCOPY  11/11/2017   polyps, diverticulosis   COLONOSCOPY  08/11/2021   colon polyps, anal papillae (AIN)   CORONARY ANGIOPLASTY  01/2013   Aorta bi-femoral bypass   ECTOPIC PREGNANCY SURGERY     x 2   ESOPHAGOGASTRODUODENOSCOPY  08/11/2021   5 cm hiatal hernia   left fallopian tube removed     25 years ago as of 10/26/21   MASS EXCISION N/A 11/01/2021   Procedure: EXCISION OF ANAL CANAL LESION UNDER ANOSCOPY;  Surgeon: Teresa Lonni HERO, MD;  Location: [facility];  Service: General;  Laterality: N/A;   RECTAL EXAM UNDER ANESTHESIA N/A 11/01/2021   Procedure: ANORECTAL EXAM UNDER ANESTHESIA;  Surgeon: Teresa Lonni HERO, MD;  Location: [facility];  Service: General;  Laterality: N/A;   wisdom teeth extracted      when patient was 14 years    Current Outpatient Medications  Medication Sig Dispense Refill   ALPRAZolam  (XANAX ) 0.5 MG tablet Take 1 tablet (0.5 mg total) by mouth at bedtime as needed for sleep. 30 tablet 0   Amino Acids (AMINO ACID PO) Take by mouth.     amoxicillin -clavulanate (AUGMENTIN ) 875-125 MG tablet Take 1 tablet by mouth 2 (two) times daily. 14 tablet 0   NON FORMULARY      Nutritional Supplements (BIFIDO-GENIC GROWTH FACTORS PO) Take 4 tablets by mouth daily.     Omega-3 Fatty Acids (FISH OIL) 1000 MG CAPS Take by mouth.     ORAL ELECTROLYTES PO Take by mouth.     OVER THE COUNTER MEDICATION Dietary supplement     OVER THE COUNTER MEDICATION Antioxidant blend     pantoprazole  (PROTONIX ) 20 MG tablet Take 1 tablet (20 mg total) by mouth daily.     rosuvastatin  (CRESTOR ) 20 MG tablet TAKE 1 TABLET BY MOUTH EVERY DAY 90 tablet 1   sertraline   (ZOLOFT ) 25 MG tablet Take 1 tablet (25 mg total) by mouth daily. 90 tablet 1   valACYclovir  (VALTREX ) 500 MG tablet Take 4 tablets (2000 mg) by mouth twice daily for one day. 30 tablet 3   Vitamin D , Ergocalciferol , (DRISDOL) 1.25 MG (50000 UNIT) CAPS capsule Take 50,000 Units by mouth every 7 (seven) days.     No current facility-administered medications for this visit.    Allergies as of 03/21/2024 - Review Complete 03/13/2024  Allergen Reaction Noted  Lipitor [atorvastatin ]  06/18/2020   Repatha  [evolocumab ]  12/12/2023   Septra [sulfamethoxazole-trimethoprim] Other (See Comments) 12/20/2012   Simvastatin  12/20/2012    Family History  Problem Relation Age of Onset   Hyperlipidemia Mother    Asthma Maternal Grandmother    Heart attack Maternal Grandfather    Heart disease Maternal Grandfather    Hypertension Maternal Grandfather    Hypertension Paternal Grandmother    Breast cancer Other    Colon cancer Neg Hx    Esophageal cancer Neg Hx    Stomach cancer Neg Hx    Rectal cancer Neg Hx     Social History   Socioeconomic History   Marital status: Married    Spouse name: Not on file   Number of children: 1   Years of education: Not on file   Highest education level: Bachelor's degree (e.g., BA, AB, BS)  Occupational History    Employer: [employer]  Tobacco Use   Smoking status: Every Day    Current packs/day: 0.50    Average packs/day: 0.5 packs/day for 20.0 years (10.0 ttl pk-yrs)    Types: Cigarettes   Smokeless tobacco: Never   Tobacco comments:    Recently filled Chantix  - Awaiting source of radiating leg pain origin  Vaping Use   Vaping status: Never Used  Substance and Sexual Activity   Alcohol use: Yes    Alcohol/week: 2.0 standard drinks of alcohol    Types: 2 Glasses of wine per week    Comment: weekends only   Drug use: No   Sexual activity: Yes    Birth control/protection: Post-menopausal  Other Topics Concern   Not on file   Social History Narrative   Lives at home with husband.     Social Drivers of Corporate Investment Banker Strain: Low Risk  (03/13/2023)   Overall Financial Resource Strain (CARDIA)    Difficulty of Paying Living Expenses: Not hard at all  Food Insecurity: No Food Insecurity (03/13/2023)   Hunger Vital Sign    Worried About Running Out of Food in the Last Year: Never true    Ran Out of Food in the Last Year: Never true  Transportation Needs: No Transportation Needs (03/13/2023)   PRAPARE - Administrator, Civil Service (Medical): No    Lack of Transportation (Non-Medical): No  Physical Activity: Sufficiently Active (03/13/2023)   Exercise Vital Sign    Days of Exercise per Week: 5 days    Minutes of Exercise per Session: 50 min  Stress: Stress Concern Present (03/13/2023)   [employer] of Occupational Health - Occupational Stress Questionnaire    Feeling of Stress : Very much  Social Connections: Socially Integrated (03/13/2023)   Social Connection and Isolation Panel    Frequency of Communication with Friends and Family: More than three times a week    Frequency of Social Gatherings with Friends and Family: Once a week    Attends Religious Services: More than 4 times per year    Active Member of [employer] or Organizations: Yes    Attends Engineer, Structural: More than 4 times per year    Marital Status: Married  Catering Manager Violence: Not on file    Review of Systems:    Constitutional: No weight loss, fever, chills, weakness or fatigue HEENT: Eyes: No change in vision               Ears, Nose, Throat:  No change in hearing or congestion  Skin: No rash or itching Cardiovascular: No chest pain, chest pressure or palpitations   Respiratory: No SOB or cough Gastrointestinal: See HPI and otherwise negative Genitourinary: No dysuria or change in urinary frequency Neurological: No headache, dizziness or syncope Musculoskeletal: No new muscle or joint  pain Hematologic: No bleeding or bruising Psychiatric: No history of depression or anxiety    Physical Exam:  Vital signs: LMP 10/31/2010   Constitutional: NAD, alert and cooperative Head:  Normocephalic and atraumatic. Eyes:   PEERL, EOMI. No icterus. Conjunctiva pink. Respiratory: Respirations even and unlabored. Lungs clear to auscultation bilaterally.   No wheezes, crackles, or rhonchi.  Cardiovascular:  Regular rate and rhythm. No peripheral edema, cyanosis or pallor.  Gastrointestinal:  Soft, nondistended, nontender. No rebound or guarding. Normal bowel sounds. No appreciable masses or hepatomegaly. Rectal:  Declines Msk:  Symmetrical without gross deformities. Without edema, no deformity or joint abnormality.  Neurologic:  Alert and  oriented x4;  grossly normal neurologically.  Skin:   Dry and intact without significant lesions or rashes. Psychiatric: Oriented to person, place and time. Demonstrates good judgement and reason without abnormal affect or behaviors.  Physical Exam    RELEVANT LABS AND IMAGING: CBC    Component Value Date/Time   WBC 7.7 04/26/2022 1406   RBC 5.00 04/26/2022 1406   HGB 15.9 (H) 04/26/2022 1406   HCT 46.1 (H) 04/26/2022 1406   PLT 235.0 04/26/2022 1406   MCV 92.3 04/26/2022 1406   MCH 30.5 07/19/2021 0901   MCHC 34.4 04/26/2022 1406   RDW 13.1 04/26/2022 1406   LYMPHSABS 2.1 04/26/2022 1406   MONOABS 0.7 04/26/2022 1406   EOSABS 0.1 04/26/2022 1406   BASOSABS 0.0 04/26/2022 1406    CMP     Component Value Date/Time   NA 139 04/26/2022 1406   K 4.7 04/26/2022 1406   CL 103 04/26/2022 1406   CO2 27 04/26/2022 1406   GLUCOSE 90 04/26/2022 1406   BUN 17 04/26/2022 1406   CREATININE 0.88 04/26/2022 1406   CALCIUM  10.1 04/26/2022 1406   PROT 7.1 02/03/2023 0912   ALBUMIN  4.7 02/03/2023 0912   AST 23 02/03/2023 0912   ALT 25 02/03/2023 0912   ALKPHOS 87 02/03/2023 0912   BILITOT 0.4 02/03/2023 0912   GFRNONAA >60 07/19/2021 0901    GFRAA >90 01/31/2013 0445     Assessment/Plan:   Assessment and Plan Assessment & Plan    GERD Hiatal hernia EGD 07/2021 for hematemesis with 5 cm hiatal hernia and ulceration at GEJ.  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

## 2024-03-21 ENCOUNTER — Ambulatory Visit: Admitting: Gastroenterology

## 2024-03-21 ENCOUNTER — Encounter: Payer: Self-pay | Admitting: Gastroenterology

## 2024-03-21 VITALS — BP 136/90 | HR 73 | Ht 67.0 in | Wt 164.1 lb

## 2024-03-21 DIAGNOSIS — K449 Diaphragmatic hernia without obstruction or gangrene: Secondary | ICD-10-CM

## 2024-03-21 DIAGNOSIS — Z79899 Other long term (current) drug therapy: Secondary | ICD-10-CM

## 2024-03-21 DIAGNOSIS — K219 Gastro-esophageal reflux disease without esophagitis: Secondary | ICD-10-CM | POA: Diagnosis not present

## 2024-03-21 DIAGNOSIS — Z860100 Personal history of colon polyps, unspecified: Secondary | ICD-10-CM | POA: Diagnosis not present

## 2024-03-21 DIAGNOSIS — N200 Calculus of kidney: Secondary | ICD-10-CM

## 2024-03-21 DIAGNOSIS — K6282 Dysplasia of anus: Secondary | ICD-10-CM

## 2024-03-21 MED ORDER — PANTOPRAZOLE SODIUM 20 MG PO TBEC: 20.0000 mg | DELAYED_RELEASE_TABLET | Freq: Two times a day (BID) | ORAL | 11 refills | Status: AC

## 2024-03-21 NOTE — Progress Notes (Signed)
 Agree with assessment and plan as outlined.

## 2024-03-21 NOTE — Patient Instructions (Addendum)
 _______________________________________________________  If your blood pressure at your visit was 140/90 or greater, please contact your primary care physician to follow up on this.  _______________________________________________________  If you are age 63 or older, your body mass index should be between 23-30. Your Body mass index is 25.71 kg/m. If this is out of the aforementioned range listed, please consider follow up with your Primary Care Provider.  If you are age 25 or younger, your body mass index should be between 19-25. Your Body mass index is 25.71 kg/m. If this is out of the aformentioned range listed, please consider follow up with your Primary Care Provider.   ________________________________________________________  The [facility] providers would like to encourage you to use MYCHART to communicate with providers for non-urgent requests or questions.  Due to long hold times on the telephone, sending your provider a message by [facility] may be a faster and more efficient way to get a response.  Please allow 48 business hours for a response.  Please remember that this is for non-urgent requests.  _______________________________________________________  [facility] is using a team-based approach to care.  Your team is made up of your doctor and two to three APPS. Our APPS (Nurse Practitioners and Physician Assistants) work with your physician to ensure care continuity for you. They are fully qualified to address your health concerns and develop a treatment plan. They communicate directly with your gastroenterologist to care for you. Seeing the Advanced Practice Practitioners on your physician's team can help you by facilitating care more promptly, often allowing for earlier appointments, access to diagnostic testing, procedures, and other specialty referrals.   We have sent the following medications to your pharmacy for you to pick up at your convenience:  Pantoprazole   20mg  one tablet two times daily  Thank you for entrusting me with your care and choosing [facility].  Bayley McMicheal, PA-C

## 2024-04-03 ENCOUNTER — Other Ambulatory Visit: Payer: Self-pay | Admitting: Vascular Surgery

## 2024-04-03 DIAGNOSIS — I6529 Occlusion and stenosis of unspecified carotid artery: Secondary | ICD-10-CM

## 2024-04-29 ENCOUNTER — Encounter: Payer: Self-pay | Admitting: Internal Medicine

## 2024-05-05 ENCOUNTER — Other Ambulatory Visit: Payer: Self-pay | Admitting: Internal Medicine

## 2024-05-05 DIAGNOSIS — F411 Generalized anxiety disorder: Secondary | ICD-10-CM

## 2024-05-13 NOTE — Telephone Encounter (Signed)
 Referral message sent in Epic to [facility] today for hiatal hernia per Bayley.

## 2024-05-20 NOTE — Progress Notes (Signed)
 [facility] [place]  ASSESSMENT / PLAN: [name] is a 64 y.o. female with peripheral arterial disease status post aortobifemoral bypass in 2014.   Recommend:  Abstinence from all tobacco products. Blood glucose control with goal A1c < 7%. Blood pressure control with goal blood pressure < 130/80 mmHg. Lipid reduction therapy with goal LDL-C < 55 mg/dL. Aspirin  81mg  by mouth daily. Atorvastatin  40-80mg  PO QD (or other high intensity statin therapy).  Continue to exercise. Continue to work on quitting smoking. OK for ventral hernia surgery from my standpoint at any time. Will see her back in a year with CT angiogram and ABI.    CHIEF COMPLAINT: Follow-up aortobifemoral bypass  HISTORY OF PRESENT ILLNESS: [name] is a 64 y.o. female presents to clinic for evaluation at the recommendation of her primary care doctor.  The patient underwent an aortobifemoral bypass graft 01/30/2013 with Dr. Gerlean.  She did very well postoperatively.  She took his recommendations to heart and has been taking excellent care of herself.  Walks 6 to 8 miles a day.  She is trying to quit smoking but is finding it hard to do so.  She is compliant with aspirin  and statin therapies.  Has noticed no hernia or discomfort in her incisional sites.  11/18/20: Patient returns to clinic reporting right leg discomfort.  The pain radiates from her buttock down her leg.  Pain does not seem to be positional.  She has no exertional discomfort.  She does not have symptoms typical of intermittent claudication (no cramping when ambulating).  She is still walking 5 to 6 miles a day.  She still smokes, unfortunately.  No pain at rest.  No ulceration.  02/21/23: Patient returns to clinic for follow-up.  The patient reports extremely long distance numbness and cramping discomfort in her right leg.  She is a daily hiker.  She reports this happened after miles of walking.  I reviewed the technical  challenges with revascularization for person with long distance claudication.  The patient is also actively smoking.  I explained how this would threaten the longevity of any intervention.  Ultimately she is understanding about continuing conservative therapy.  05/22/23: Doing well overall.  No major issues since I saw her last.  She is still exercising is much as she likes.  She still is smoking anywhere from 0 to 1/2 pack of cigarettes a day.  She has a ventral hernia for which she is seeing Dr. Deward Foy in the near future.   VASCULAR SURGICAL HISTORY:  Aortobifemoral bypass graft 01/30/13 with Dr. Gerlean  Past Medical History:  Diagnosis Date   Anxiety    takes Xanax  prn    Asthma    Back pain    DDD   Bilateral kidney stones    Cancer (HCC) 2023   ankle - basal cell carcinoma   Constipation    COVID-19 04/25/2021   flu-like symptoms   Depression    takes Effexor  daily   Fracture of tibia and fibula 1986   left - trauma   Genital herpes    GERD (gastroesophageal reflux disease)    takes Omeprazole daily   Headache    hx of migraines, sinus headaches   Hemorrhoids    History of abnormal cervical Pap smear    age 52--hx conization-paps normal since   History of bronchitis    last time 3months ago   History of colon polyps    History of hiatal hernia 07/2021  5cm per 08/11/21 EGD   History of migraine    last one 22yrs ago--with aura   HSV-1 infection    Hyperlipidemia    not on meds at present and Dr.McKenzie will review after surgery   Insomnia    Osteopenia 2024   hip and spine   Peripheral vascular disease    PONV (postoperative nausea and vomiting)    PUD (peptic ulcer disease)    Weakness    bil legs, from pvd, resolved after aortobifemoral bypass in 2014   Wears glasses     Past Surgical History:  Procedure Laterality Date   AIN I  11/05/2021   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   COLONOSCOPY  11/11/2017   polyps, diverticulosis   COLONOSCOPY  08/11/2021   colon polyps, anal papillae (AIN)   CORONARY ANGIOPLASTY  01/2013   Aorta bi-femoral bypass   ECTOPIC PREGNANCY SURGERY     x 2   ESOPHAGOGASTRODUODENOSCOPY  08/11/2021   5 cm hiatal hernia   left fallopian tube removed     25 years ago as of 10/26/21   MASS EXCISION N/A 11/01/2021   Procedure: EXCISION OF ANAL CANAL LESION UNDER ANOSCOPY;  Surgeon: Teresa Lonni HERO, MD;  Location: [facility];  Service: General;  Laterality: N/A;   RECTAL EXAM UNDER ANESTHESIA N/A 11/01/2021   Procedure: ANORECTAL EXAM UNDER ANESTHESIA;  Surgeon: Teresa Lonni HERO, MD;  Location: [facility];  Service: General;  Laterality: N/A;   wisdom teeth extracted      when patient was 14 years    Family History  Problem Relation Age of Onset   Hyperlipidemia Mother    Asthma Maternal Grandmother    Heart attack Maternal Grandfather    Heart disease Maternal Grandfather    Hypertension Maternal Grandfather    Hypertension Paternal Grandmother    Breast cancer Other    Colon cancer Neg Hx    Esophageal cancer Neg Hx    Stomach cancer Neg Hx    Rectal cancer Neg Hx     Social History   Socioeconomic History   Marital status: Married    Spouse name: Not on file   Number of children: 1   Years of education: Not on file   Highest education level: Bachelor's degree (e.g., BA, AB, BS)  Occupational History    Employer: [employer]  Tobacco Use   Smoking status: Every Day    Current packs/day: 0.50    Average  packs/day: 0.5 packs/day for 20.0 years (10.0 ttl pk-yrs)    Types: Cigarettes   Smokeless tobacco: Never   Tobacco comments:    Recently filled Chantix  - Awaiting source of radiating leg pain origin  Vaping Use   Vaping status: Never Used  Substance and Sexual Activity   Alcohol use: Yes    Alcohol/week: 2.0 standard drinks of alcohol    Types: 2 Glasses of wine per week    Comment: weekends  only   Drug use: No   Sexual activity: Yes    Birth control/protection: Post-menopausal  Other Topics Concern   Not on file  Social History Narrative   Lives at home with husband.     Social Drivers of Health   Tobacco Use: High Risk (03/21/2024)   Patient History    Smoking Tobacco Use: Every Day    Smokeless Tobacco Use: Never    Passive Exposure: Not on file  Financial Resource Strain: Low Risk (03/13/2023)   Overall Financial Resource Strain (CARDIA)    Difficulty of Paying Living Expenses: Not hard at all  Food Insecurity: No Food Insecurity (03/13/2023)   Hunger Vital Sign    Worried About Running Out of Food in the Last Year: Never true    Ran Out of Food in the Last Year: Never true  Transportation Needs: No Transportation Needs (03/13/2023)   PRAPARE - Administrator, Civil Service (Medical): No    Lack of Transportation (Non-Medical): No  Physical Activity: Sufficiently Active (03/13/2023)   Exercise Vital Sign    Days of Exercise per Week: 5 days    Minutes of Exercise per Session: 50 min  Stress: Stress Concern Present (03/13/2023)   [employer] of Occupational Health - Occupational Stress Questionnaire    Feeling of Stress : Very much  Social Connections: Socially Integrated (03/13/2023)   Social Connection and Isolation Panel    Frequency of Communication with Friends and Family: More than three times a week    Frequency of Social Gatherings with Friends and Family: Once a week    Attends Religious Services: More than 4 times per year    Active  Member of Clubs or Organizations: Yes    Attends Banker Meetings: More than 4 times per year    Marital Status: Married  Catering Manager Violence: Not on file  Depression (PHQ2-9): Low Risk (02/05/2024)   Depression (PHQ2-9)    PHQ-2 Score: 1  Recent Concern: Depression (PHQ2-9) - High Risk (01/10/2024)   Depression (PHQ2-9)    PHQ-2 Score: 13  Alcohol Screen: Low Risk (03/13/2023)   Alcohol Screen    Last Alcohol Screening Score (AUDIT): 4  Housing: Unknown (02/26/2024)   Received from [facility]   Epic    Unable to Pay for Housing in the Last Year: Not on file    Number of Times Moved in the Last Year: Not on file    At any time in the past 12 months, were you homeless or living in a shelter (including now)?: No  Utilities: Not on file  Health Literacy: Not on file    Allergies  Allergen Reactions   Lipitor [Atorvastatin ]     Muscle aches   Repatha  [Evolocumab ]     Muscle cramps     Septra [Sulfamethoxazole-Trimethoprim] Other (See Comments)    Unknown    Simvastatin     Muscle cramps     Current Outpatient Medications  Medication Sig Dispense Refill   ALPRAZolam  (XANAX ) 0.5 MG tablet TAKE 1 TABLET (0.5 MG TOTAL) BY MOUTH AT BEDTIME AS NEEDED FOR SLEEP 30 tablet 0   Amino Acids (AMINO ACID PO) Take by mouth.     amoxicillin -clavulanate (AUGMENTIN ) 875-125 MG tablet Take 1 tablet by mouth 2 (two) times daily. 14 tablet 0   NON FORMULARY      Nutritional Supplements (BIFIDO-GENIC GROWTH FACTORS PO) Take 4 tablets by mouth daily.     Omega-3 Fatty Acids (FISH  OIL) 1000 MG CAPS Take by mouth.     ORAL ELECTROLYTES PO Take by mouth.     OVER THE COUNTER MEDICATION Dietary supplement     OVER THE COUNTER MEDICATION Antioxidant blend     pantoprazole  (PROTONIX ) 20 MG tablet Take 1 tablet (20 mg total) by mouth 2 (two) times daily. 60 tablet 11   rosuvastatin  (CRESTOR ) 20 MG tablet TAKE 1 TABLET BY MOUTH EVERY DAY 90 tablet 1   sertraline   (ZOLOFT ) 25 MG tablet Take 1 tablet (25 mg total) by mouth daily. 90 tablet 1   valACYclovir  (VALTREX ) 500 MG tablet Take 4 tablets (2000 mg) by mouth twice daily for one day. 30 tablet 3   Vitamin D , Ergocalciferol , (DRISDOL) 1.25 MG (50000 UNIT) CAPS capsule Take 50,000 Units by mouth every 7 (seven) days.     No current facility-administered medications for this visit.    PHYSICAL EXAM  Vitals:   05/21/24 0852  BP: (!) 142/86  Pulse: 65  SpO2: 98%  Weight: 163 lb (73.9 kg)  Height: 5' 7 (1.702 m)    No acute distress Ventral hernia in epigastric abdomen just left of midline. Mild tenderness Weakly palpable PT pulses.   PERTINENT LABORATORY AND RADIOLOGIC DATA  Most recent CBC    Latest Ref Rng & Units 04/26/2022    2:06 PM 08/06/2021    9:24 AM 07/19/2021    9:01 AM  CBC  WBC 4.0 - 10.5 K/uL 7.7  5.5  7.1   Hemoglobin 12.0 - 15.0 g/dL 84.0  85.6  85.1   Hematocrit 36.0 - 46.0 % 46.1  42.1  44.6   Platelets 150.0 - 400.0 K/uL 235.0  198.0  240      Most recent CMP    Latest Ref Rng & Units 02/03/2023    9:12 AM 04/26/2022    2:06 PM 08/06/2021    9:24 AM  CMP  Glucose 70 - 99 mg/dL  90  92   BUN 6 - 23 mg/dL  17  17   Creatinine 9.59 - 1.20 mg/dL  9.11  9.18   Sodium 864 - 145 mEq/L  139  140   Potassium 3.5 - 5.1 mEq/L  4.7  4.4   Chloride 96 - 112 mEq/L  103  104   CO2 19 - 32 mEq/L  27  30   Calcium  8.4 - 10.5 mg/dL  89.8  9.3   Total Protein 6.0 - 8.5 g/dL 7.1  7.6  6.7   Total Bilirubin 0.0 - 1.2 mg/dL 0.4  0.4  0.3   Alkaline Phos 44 - 121 IU/L 87  84  83   AST 0 - 40 IU/L 23  22  26    ALT 0 - 32 IU/L 25  22  26      Renal function CrCl cannot be calculated (Patient's most recent lab result is older than the maximum 21 days allowed.).  Hemoglobin A1C (no units)  Date Value  02/03/2023 5.9    LDL Chol Calc (NIH)  Date Value Ref Range Status  01/12/2024 137 (H) 0 - 99 mg/dL Final   Direct LDL  Date Value Ref Range Status  04/26/2022 128.0  mg/dL Final    Comment:    Optimal:  <100 mg/dLNear or Above Optimal:  100-129 mg/dLBorderline High:  130-159 mg/dLHigh:  160-189 mg/dLVery High:  >190 mg/dL        Right Carotid: Velocities in the right ICA are consistent with a 1-39%  stenosis.  The ECA appears <50% stenosed.   Left Carotid: Velocities in the left ICA are consistent with a 1-39%  stenosis.   Vertebrals: Bilateral vertebral arteries demonstrate antegrade flow.  Subclavians: Normal flow hemodynamics were seen in bilateral subclavian               arteries.   Debby SAILOR. Magda, MD [facility] Phone Number: [phone] 05/21/2024 9:38 AM

## 2024-05-21 ENCOUNTER — Encounter: Payer: Self-pay | Admitting: Vascular Surgery

## 2024-05-21 ENCOUNTER — Ambulatory Visit: Admitting: Vascular Surgery

## 2024-05-21 ENCOUNTER — Ambulatory Visit (HOSPITAL_COMMUNITY)
Admission: RE | Admit: 2024-05-21 | Discharge: 2024-05-21 | Disposition: A | Discharge status: Home / Self Care | Source: Ambulatory Visit | Attending: Vascular Surgery | Admitting: Vascular Surgery

## 2024-05-21 VITALS — BP 142/86 | HR 65 | Ht 67.0 in | Wt 163.0 lb

## 2024-05-21 DIAGNOSIS — I6529 Occlusion and stenosis of unspecified carotid artery: Secondary | ICD-10-CM | POA: Diagnosis not present

## 2024-05-21 DIAGNOSIS — I739 Peripheral vascular disease, unspecified: Secondary | ICD-10-CM | POA: Diagnosis not present

## 2024-05-22 ENCOUNTER — Encounter: Payer: Self-pay | Admitting: Vascular Surgery

## 2024-05-28 ENCOUNTER — Telehealth (HOSPITAL_BASED_OUTPATIENT_CLINIC_OR_DEPARTMENT_OTHER): Payer: Self-pay | Admitting: *Deleted

## 2024-05-28 ENCOUNTER — Telehealth: Payer: Self-pay

## 2024-05-28 NOTE — Telephone Encounter (Signed)
"  ° °  Pre-operative Risk Assessment    Patient Name: [name]  DOB: [birth year] MRN: [id number]   Date of last office visit: 09/13/2023 Date of next office visit: None  Request for Surgical Clearance    Procedure:  Hiatal hernia surgery  Date of Surgery:  Clearance TBD                                 Surgeon:  Dr. Deward Foy Surgeon's Group or Practice Name:  [facility] Phone number:  [phone] Fax number:  [phone]   Type of Clearance Requested:   - Medical    Type of Anesthesia:  General    Additional requests/questions:    Signed, Edsel Grayce Sanders   05/28/2024, 12:26 PM   "

## 2024-05-28 NOTE — Telephone Encounter (Signed)
 Appointment is scheduled for 06/17/2024 @ 9am. Med req and consent are complete. Call patient at [phone].

## 2024-05-28 NOTE — Telephone Encounter (Signed)
"  °  Patient Consent for Virtual Visit         [name] has provided verbal consent on 05/28/2024 for a virtual visit (video or telephone).  Appointment is scheduled for 06/17/2024 @ 9am. Med req and consent are complete. Call patient at [phone].    CONSENT FOR VIRTUAL VISIT FOR:  [name]  By participating in this virtual visit I agree to the following:  I hereby voluntarily request, consent and authorize [facility] and its employed or contracted physicians, physician assistants, nurse practitioners or other licensed health care professionals (the Practitioner), to provide me with telemedicine health care services (the Services) as deemed necessary by the treating Practitioner. I acknowledge and consent to receive the Services by the Practitioner via telemedicine. I understand that the telemedicine visit will involve communicating with the Practitioner through live audiovisual communication technology and the disclosure of certain medical information by electronic transmission. I acknowledge that I have been given the opportunity to request an in-person assessment or other available alternative prior to the telemedicine visit and am voluntarily participating in the telemedicine visit.  I understand that I have the right to withhold or withdraw my consent to the use of telemedicine in the course of my care at any time, without affecting my right to future care or treatment, and that the Practitioner or I may terminate the telemedicine visit at any time. I understand that I have the right to inspect all information obtained and/or recorded in the course of the telemedicine visit and may receive copies of available information for a reasonable fee.  I understand that some of the potential risks of receiving the Services via telemedicine include:  Delay or interruption in medical evaluation due to technological equipment failure or disruption; Information transmitted  may not be sufficient (e.g. poor resolution of images) to allow for appropriate medical decision making by the Practitioner; and/or  In rare instances, security protocols could fail, causing a breach of personal health information.  Furthermore, I acknowledge that it is my responsibility to provide information about my medical history, conditions and care that is complete and accurate to the best of my ability. I acknowledge that Practitioner's advice, recommendations, and/or decision may be based on factors not within their control, such as incomplete or inaccurate data provided by me or distortions of diagnostic images or specimens that may result from electronic transmissions. I understand that the practice of medicine is not an exact science and that Practitioner makes no warranties or guarantees regarding treatment outcomes. I acknowledge that a copy of this consent can be made available to me via my patient portal [facility] MyChart), or I can request a printed copy by calling the office of [facility].    I understand that my insurance will be billed for this visit.   I have read or had this consent read to me. I understand the contents of this consent, which adequately explains the benefits and risks of the Services being provided via telemedicine.  I have been provided ample opportunity to ask questions regarding this consent and the Services and have had my questions answered to my satisfaction. I give my informed consent for the services to be provided through the use of telemedicine in my medical care    "

## 2024-05-28 NOTE — Telephone Encounter (Signed)
 Called patient, NA, left a message for her to contact our office to schedule a telehealth visit for cardiac clearance.

## 2024-05-28 NOTE — Telephone Encounter (Signed)
" ° °  Name: [name]  DOB: December 05, 1960  MRN: [id number]  Primary Cardiologist: Dorn Lesches, MD  Preoperative team, please contact this patient and set up a phone call appointment for further preoperative risk assessment. Please obtain consent and complete medication review. Thank you for your help.  She is not currently on antiplatelet or anticoagulation therapy.  I also confirmed the patient resides in the state of [name] . As per [facility] Board telemedicine laws, the patient must reside in the state in which the provider is licensed.  Saddie GORMAN Cleaves, NP 05/28/2024, 12:36 PM [facility]    "

## 2024-06-17 ENCOUNTER — Ambulatory Visit

## 2024-06-17 ENCOUNTER — Telehealth: Payer: Self-pay | Admitting: General Practice

## 2024-06-17 DIAGNOSIS — Z01810 Encounter for preprocedural cardiovascular examination: Secondary | ICD-10-CM | POA: Diagnosis not present

## 2024-06-17 NOTE — Telephone Encounter (Signed)
 error

## 2024-06-18 ENCOUNTER — Ambulatory Visit: Payer: Self-pay | Admitting: Surgery

## 2024-07-01 ENCOUNTER — Encounter (HOSPITAL_COMMUNITY)

## 2024-07-08 ENCOUNTER — Ambulatory Visit (HOSPITAL_COMMUNITY): Admit: 2024-07-08 | Admitting: Surgery

## 2024-12-16 ENCOUNTER — Ambulatory Visit: Admitting: Obstetrics and Gynecology
